# Patient Record
Sex: Female | Born: 1940
Health system: Southern US, Community
[De-identification: ages and names within clinical notes are randomized; demographics above are authoritative.]

## PROBLEM LIST (undated history)

## (undated) DIAGNOSIS — Z8619 Personal history of other infectious and parasitic diseases: Secondary | ICD-10-CM

## (undated) DIAGNOSIS — R030 Elevated blood-pressure reading, without diagnosis of hypertension: Secondary | ICD-10-CM

## (undated) DIAGNOSIS — I10 Essential (primary) hypertension: Secondary | ICD-10-CM

## (undated) DIAGNOSIS — E876 Hypokalemia: Secondary | ICD-10-CM

## (undated) DIAGNOSIS — Z124 Encounter for screening for malignant neoplasm of cervix: Secondary | ICD-10-CM

## (undated) DIAGNOSIS — Z0000 Encounter for general adult medical examination without abnormal findings: Secondary | ICD-10-CM

## (undated) DIAGNOSIS — T7840XA Allergy, unspecified, initial encounter: Secondary | ICD-10-CM

## (undated) DIAGNOSIS — R945 Abnormal results of liver function studies: Secondary | ICD-10-CM

## (undated) DIAGNOSIS — E663 Overweight: Secondary | ICD-10-CM

## (undated) DIAGNOSIS — H269 Unspecified cataract: Secondary | ICD-10-CM

## (undated) DIAGNOSIS — E785 Hyperlipidemia, unspecified: Secondary | ICD-10-CM

## (undated) DIAGNOSIS — E079 Disorder of thyroid, unspecified: Secondary | ICD-10-CM

## (undated) DIAGNOSIS — J069 Acute upper respiratory infection, unspecified: Secondary | ICD-10-CM

## (undated) DIAGNOSIS — IMO0001 Reserved for inherently not codable concepts without codable children: Secondary | ICD-10-CM

## (undated) DIAGNOSIS — E559 Vitamin D deficiency, unspecified: Secondary | ICD-10-CM

## (undated) DIAGNOSIS — D045 Carcinoma in situ of skin of trunk: Secondary | ICD-10-CM

## (undated) DIAGNOSIS — C6990 Malignant neoplasm of unspecified site of unspecified eye: Secondary | ICD-10-CM

## (undated) HISTORY — DX: Reserved for inherently not codable concepts without codable children: IMO0001

## (undated) HISTORY — PX: TONSILLECTOMY: SUR1361

## (undated) HISTORY — DX: Overweight: E66.3

## (undated) HISTORY — PX: TRUNK SKIN LESION EXCISIONAL BIOPSY: SUR474

## (undated) HISTORY — DX: Hyperlipidemia, unspecified: E78.5

## (undated) HISTORY — PX: OTHER SURGICAL HISTORY: SHX169

## (undated) HISTORY — DX: Hypokalemia: E87.6

## (undated) HISTORY — DX: Acute upper respiratory infection, unspecified: J06.9

## (undated) HISTORY — DX: Vitamin D deficiency, unspecified: E55.9

## (undated) HISTORY — DX: Encounter for screening for malignant neoplasm of cervix: Z12.4

## (undated) HISTORY — DX: Abnormal results of liver function studies: R94.5

## (undated) HISTORY — DX: Elevated blood-pressure reading, without diagnosis of hypertension: R03.0

## (undated) HISTORY — DX: Malignant neoplasm of unspecified site of unspecified eye: C69.90

## (undated) HISTORY — DX: Encounter for general adult medical examination without abnormal findings: Z00.00

## (undated) HISTORY — DX: Disorder of thyroid, unspecified: E07.9

## (undated) HISTORY — DX: Essential (primary) hypertension: I10

## (undated) HISTORY — DX: Personal history of other infectious and parasitic diseases: Z86.19

## (undated) HISTORY — DX: Unspecified cataract: H26.9

## (undated) HISTORY — DX: Allergy, unspecified, initial encounter: T78.40XA

## (undated) HISTORY — DX: Carcinoma in situ of skin of trunk: D04.5

---

## 1995-04-07 HISTORY — PX: OTHER SURGICAL HISTORY: SHX169

## 2010-06-23 DIAGNOSIS — C6930 Malignant neoplasm of unspecified choroid: Secondary | ICD-10-CM | POA: Insufficient documentation

## 2010-06-23 DIAGNOSIS — Z85840 Personal history of malignant neoplasm of eye: Secondary | ICD-10-CM | POA: Insufficient documentation

## 2010-06-23 DIAGNOSIS — H04129 Dry eye syndrome of unspecified lacrimal gland: Secondary | ICD-10-CM | POA: Insufficient documentation

## 2010-07-02 ENCOUNTER — Ambulatory Visit (INDEPENDENT_AMBULATORY_CARE_PROVIDER_SITE_OTHER): Payer: Medicare Other | Admitting: Family Medicine

## 2010-07-02 ENCOUNTER — Encounter: Payer: Self-pay | Admitting: Family Medicine

## 2010-07-02 DIAGNOSIS — R7989 Other specified abnormal findings of blood chemistry: Secondary | ICD-10-CM | POA: Insufficient documentation

## 2010-07-02 DIAGNOSIS — D045 Carcinoma in situ of skin of trunk: Secondary | ICD-10-CM | POA: Insufficient documentation

## 2010-07-02 DIAGNOSIS — R030 Elevated blood-pressure reading, without diagnosis of hypertension: Secondary | ICD-10-CM

## 2010-07-02 DIAGNOSIS — Z8619 Personal history of other infectious and parasitic diseases: Secondary | ICD-10-CM | POA: Insufficient documentation

## 2010-07-02 DIAGNOSIS — H409 Unspecified glaucoma: Secondary | ICD-10-CM | POA: Insufficient documentation

## 2010-07-02 DIAGNOSIS — M810 Age-related osteoporosis without current pathological fracture: Secondary | ICD-10-CM | POA: Insufficient documentation

## 2010-07-02 DIAGNOSIS — E079 Disorder of thyroid, unspecified: Secondary | ICD-10-CM | POA: Insufficient documentation

## 2010-07-02 DIAGNOSIS — T7840XA Allergy, unspecified, initial encounter: Secondary | ICD-10-CM | POA: Insufficient documentation

## 2010-07-02 DIAGNOSIS — Z85840 Personal history of malignant neoplasm of eye: Secondary | ICD-10-CM

## 2010-07-02 DIAGNOSIS — C6990 Malignant neoplasm of unspecified site of unspecified eye: Secondary | ICD-10-CM | POA: Insufficient documentation

## 2010-07-02 DIAGNOSIS — Z85820 Personal history of malignant melanoma of skin: Secondary | ICD-10-CM | POA: Insufficient documentation

## 2010-07-02 DIAGNOSIS — J45909 Unspecified asthma, uncomplicated: Secondary | ICD-10-CM | POA: Insufficient documentation

## 2010-07-02 DIAGNOSIS — H269 Unspecified cataract: Secondary | ICD-10-CM | POA: Insufficient documentation

## 2010-07-02 DIAGNOSIS — E663 Overweight: Secondary | ICD-10-CM

## 2010-07-02 HISTORY — DX: Other specified abnormal findings of blood chemistry: R79.89

## 2010-07-02 HISTORY — DX: Disorder of thyroid, unspecified: E07.9

## 2010-07-02 HISTORY — DX: Overweight: E66.3

## 2010-07-02 HISTORY — DX: Personal history of other infectious and parasitic diseases: Z86.19

## 2010-07-02 NOTE — Assessment & Plan Note (Signed)
Patient encouraged to maintain her good exercise regimen and avoid fatty foods and simpel carbs, Check a TSH to further evaluate.

## 2010-07-02 NOTE — Assessment & Plan Note (Signed)
Sees multiple doctors for this. Has undergone surgery and Proton radiation at [facility] with a very good response she has lost some central vision and has trouble driving during the night hours but overall is happy with her course of treatment and recovery. She sees Dr Bryon Lions locally in [place] for Opthamology and he follows her Glaucoma and Cataracts and she also sees opthamology at [facility], Dr Loyal Buba and they ultimately referred her on to Dr Driscilla Moats, at [facility], Dept of Opthamology where her procedures were done. She underwent her proton beam irradiation there and had a good response. After 6 months of treatments her visual acuity was 20/30 OD and 20/50 OS.

## 2010-07-02 NOTE — Progress Notes (Signed)
Subjective:    Patient ID: Karen Hoffman, female    DOB: [birth year], 70 y.o.   MRN: [id number]  HPI patient is in today for new patient appointment. If she has a complicated past medical history which is most significant for melanoma in her left eye. The diagnosis was made in 2010 which he noted visual changes. She saw a local ophthalmologist to quickly refer her to [facility] with [place] or for her to [facility]. She underwent uncomplicated surgery with removal of her left eye plate implanted and then underwent proton beam treatments and has responded well. She has lost some central vision and has difficulty with night vision but still maintains her peripheral vision in that eye. She otherwise feels well. She denies any recent illness, fevers, chills, chest pain, palpitations, shortness of breath, GI or GU complaints. Her blood pressure is elevated when she presents to the office and she reports it has been elevated in the past in the past she has taken hydrochlorothiazide for some results. She also does back in the 1970s she was treated for some time with Synthroid but then her numbers improved and she has not taken this medication since then. She reports using Fosamax years ago for osteoporosis but stopping the medication for unclear reasons. She does take Caltrate daily.     Review of Systems  Constitutional: Negative for fever, chills, diaphoresis, activity change, appetite change, fatigue and unexpected weight change.  HENT: Negative for hearing loss, ear pain, nosebleeds, congestion, facial swelling, rhinorrhea, sneezing, neck pain, neck stiffness, postnasal drip, sinus pressure, tinnitus and ear discharge.   Eyes: Positive for visual disturbance. Negative for photophobia, pain, discharge and redness.  Respiratory: Negative.   Cardiovascular: Negative.   Gastrointestinal: Negative.   Genitourinary: Negative.   Musculoskeletal: Negative.   Skin: Negative.   Neurological: Negative.   Hematological:  Negative.   Psychiatric/Behavioral: Negative.        Objective:   Physical Exam  Constitutional: She is oriented to person, place, and time. She appears well-developed and well-nourished. No distress.  HENT:  Head: Normocephalic and atraumatic.  Right Ear: External ear normal.  Left Ear: External ear normal.  Nose: Nose normal.  Mouth/Throat: Oropharynx is clear and moist. No oropharyngeal exudate.  Eyes: Conjunctivae and EOM are normal. Pupils are equal, round, and reactive to light. Right eye exhibits no discharge. Left eye exhibits no discharge. No scleral icterus.  Neck: Normal range of motion. Neck supple. No thyromegaly present.  Cardiovascular: Normal rate, regular rhythm, normal heart sounds and intact distal pulses.  Exam reveals no gallop and no friction rub.   No murmur heard. Pulmonary/Chest: Effort normal and breath sounds normal. No respiratory distress. She has no wheezes. She has no rales. She exhibits no tenderness.  Abdominal: Bowel sounds are normal. She exhibits no distension and no mass. There is no tenderness. There is no rebound.  Musculoskeletal: She exhibits no edema and no tenderness.  Lymphadenopathy:    She has no cervical adenopathy.  Neurological: She is alert and oriented to person, place, and time. She has normal reflexes. She displays normal reflexes. No cranial nerve deficit. Coordination normal.  Skin: Skin is warm and dry. No rash noted. She is not diaphoretic.  Psychiatric: She has a normal mood and affect. Her behavior is normal. Judgment and thought content normal.          Assessment & Plan:  Osteoporosis Patient reports she has not had a bone densitometry done in a number of years, agrees to let  us order one at her next visit. She also agrees to have labs, including a Vit D level at next visit. Continue Caltrate daily for now  Overweight Patient encouraged to maintain her good exercise regimen and avoid fatty foods and simpel carbs, Check  a TSH to further evaluate.  Thyroid disease Patient reports many years ago she was treated with Synhtroid for awhile but labs more recently have not suggested any need for this, we will recheck TSH before making any changes.  Malignant melanoma of eye Sees multiple doctors for this. Has undergone surgery and Proton radiation at [facility] with a very good response she has lost some central vision and has trouble driving during the night hours but overall is happy with her course of treatment and recovery. She sees Dr Bryon Lions locally in [place] for Opthamology and he follows her Glaucoma and Cataracts and she also sees opthamology at [facility], Dr Loyal Buba and they ultimately referred her on to Dr Driscilla Moats, at [facility], Dept of Opthamology where her procedures were done. She underwent her proton beam irradiation there and had a good response. After 6 months of treatments her visual acuity was 20/30 OD and 20/50 OS.  Allergic state No new difficulties may use Claritin OTC as needed. She did undergo allergy shots years ago but stopped them when she moved to [facility] and has had to further difficulty.  Elevated LFTs Patient reports that [facility]' office noted her LFTs were up slightly recently and asked that she have them monitored. We will check her LFTs with her return visit next month.  Elevated BP Patient hesitant to start meds, she is encouraged to avoid sodium and we will recheck next month, she is encouraged to consider Chlorthalidone at next months visit if BP remains elevated.    Past Medical History  Diagnosis Date  . Malignant melanoma of eye     left   . Cataracts, bilateral   . Glaucoma   . Elevated BP   . Osteoporosis   . Carcinoma in situ of skin of back   . Allergy     improved since move to [place]  . Asthma     mild, intermittent, improved since moving to [place]  . Overweight 07/02/2010  . Thyroid disease 07/02/2010  . History of chicken pox 07/02/2010  .  History of measles 07/02/2010    Past Surgical History  Procedure Date  . Broken ankle 1997    right, screws and plates in place  . Tonsillectomy   . Left eye surgery   . Trunk skin lesion excisional biopsy     x twice    Family History  Problem Relation Age of Onset  . Cancer Mother     lung/ smoker  . Heart attack Paternal Grandmother   . Heart disease Father   . Hypertension Father   . Kidney disease Father 42    dialysis  . Obesity Father   . Diabetes type II Father   . Graves' disease Daughter   . Cholecystitis Son 75    cholecystectomy    History   Social History  . Marital Status: Widowed    Spouse Name: N/A    Number of Children: N/A  . Years of Education: N/A   Occupational History  . Not on file.   Social History Main Topics  . Smoking status: Former Smoker -- 0.2 packs/day for 10 years    Types: Cigarettes    Quit date: 04/07/1975  . Smokeless  tobacco: Never Used  . Alcohol Use: Yes     1 glass of wine or coctail with dinner  . Drug Use: No  . Sexually Active: No   Other Topics Concern  . Not on file   Social History Narrative  . No narrative on file    No current outpatient prescriptions on file prior to visit.    No Known Allergies

## 2010-07-02 NOTE — Patient Instructions (Signed)
(High Blood Pressure) As your heart beats, it forces blood through your arteries. This force is your blood pressure. If the pressure is too high, it is called hypertension (HTN) or high blood pressure. HTN is dangerous because you may have it and not know it. High blood pressure may mean that your heart has to work harder to pump blood. Your arteries may be narrow or stiff. The extra work puts you at risk for heart disease, stroke, and other problems.  Blood pressure consists of two numbers, a higher number over a lower, 110/72, for example. It is stated as "110 over 72." The ideal is below 120 for the top number (systolic) and under 80 for the bottom (diastolic). Write down your blood pressure today. You should pay close attention to your blood pressure if you have certain conditions such as:  Heart failure.  Prior heart attack.   Diabetes   Chronic kidney disease.   Prior stroke.   Multiple risk factors for heart disease.   To see if you have HTN, your blood pressure should be measured while you are seated with your arm held at the level of the heart. It should be measured at least twice. A one-time elevated blood pressure reading (especially in the Emergency Department) does not mean that you need treatment. There may be conditions in which the blood pressure is different between your right and left arms. It is important to see your caregiver soon for a recheck. Most people have essential hypertension which means that there is not a specific cause. This type of high blood pressure may be lowered by changing lifestyle factors such as:  Stress.  Smoking.   Lack of exercise.   Excessive weight.  Drug/tobacco/alcohol use.   Eating less salt.   Most people do not have symptoms from high blood pressure until it has caused damage to the body. Effective treatment can often prevent, delay or reduce that damage. TREATMENT Treatment for high blood pressure, when a cause has been identified, is  directed at the cause. There are a large number of medications to treat HTN. These fall into several categories, and your caregiver will help you select the medicines that are best for you. Medications may have side effects. You should review side effects with your caregiver. If your blood pressure stays high after you have made lifestyle changes or started on medicines,   Your medication(s) may need to be changed.   Other problems may need to be addressed.   Be certain you understand your prescriptions, and know how and when to take your medicine.   Be sure to follow up with your caregiver within the time frame advised (usually within two weeks) to have your blood pressure rechecked and to review your medications.   If you are taking more than one medicine to lower your blood pressure, make sure you know how and at what times they should be taken. Taking two medicines at the same time can result in blood pressure that is too low.  SEEK IMMEDIATE MEDICAL CARE IF YOU DEVELOP:  A severe headache, blurred or changing vision, or confusion.   Unusual weakness or numbness, or a faint feeling.   Severe chest or abdominal pain, vomiting, or breathing problems.  MAKE SURE YOU:   Understand these instructions.   Will watch your condition.   Will get help right away if you are not doing well or get worse.  Document Released: 03/23/2005 Document Re-Released: 09/10/2009 [employer] Patient Information 2011 [employer]. 

## 2010-07-02 NOTE — Assessment & Plan Note (Signed)
Patient reports she has not had a bone densitometry done in a number of years, agrees to let us order one at her next visit. She also agrees to have labs, including a Vit D level at next visit. Continue Caltrate daily for now

## 2010-07-02 NOTE — Assessment & Plan Note (Signed)
Patient reports many years ago she was treated with Synhtroid for awhile but labs more recently have not suggested any need for this, we will recheck TSH before making any changes.

## 2010-07-02 NOTE — Assessment & Plan Note (Signed)
No new difficulties may use Claritin OTC as needed. She did undergo allergy shots years ago but stopped them when she moved to [facility] and has had to further difficulty.

## 2010-07-02 NOTE — Assessment & Plan Note (Signed)
Patient hesitant to start meds, she is encouraged to avoid sodium and we will recheck next month, she is encouraged to consider Chlorthalidone at next months visit if BP remains elevated.

## 2010-07-02 NOTE — Assessment & Plan Note (Signed)
Patient reports that Karen Hoffman' office noted her LFTs were up slightly recently and asked that she have them monitored. We will check her LFTs with her return visit next month.

## 2010-08-04 ENCOUNTER — Ambulatory Visit (INDEPENDENT_AMBULATORY_CARE_PROVIDER_SITE_OTHER): Payer: Medicare Other | Admitting: Family Medicine

## 2010-08-04 ENCOUNTER — Encounter: Payer: Self-pay | Admitting: Family Medicine

## 2010-08-04 DIAGNOSIS — R7989 Other specified abnormal findings of blood chemistry: Secondary | ICD-10-CM

## 2010-08-04 DIAGNOSIS — C6990 Malignant neoplasm of unspecified site of unspecified eye: Secondary | ICD-10-CM

## 2010-08-04 DIAGNOSIS — Z13220 Encounter for screening for lipoid disorders: Secondary | ICD-10-CM

## 2010-08-04 DIAGNOSIS — E663 Overweight: Secondary | ICD-10-CM

## 2010-08-04 DIAGNOSIS — E039 Hypothyroidism, unspecified: Secondary | ICD-10-CM

## 2010-08-04 DIAGNOSIS — M810 Age-related osteoporosis without current pathological fracture: Secondary | ICD-10-CM

## 2010-08-04 DIAGNOSIS — E079 Disorder of thyroid, unspecified: Secondary | ICD-10-CM

## 2010-08-04 DIAGNOSIS — I10 Essential (primary) hypertension: Secondary | ICD-10-CM

## 2010-08-04 DIAGNOSIS — H409 Unspecified glaucoma: Secondary | ICD-10-CM

## 2010-08-04 LAB — RENAL FUNCTION PANEL
Albumin: 3.9 g/dL (ref 3.5–5.2)
BUN: 14 mg/dL (ref 6–23)
Calcium: 9.4 mg/dL (ref 8.4–10.5)
Creatinine, Ser: 0.7 mg/dL (ref 0.4–1.2)
Glucose, Bld: 85 mg/dL (ref 70–99)
Phosphorus: 3.7 mg/dL (ref 2.3–4.6)
Potassium: 4 mEq/L (ref 3.5–5.1)

## 2010-08-04 LAB — HEPATIC FUNCTION PANEL
ALT: 32 U/L (ref 0–35)
Albumin: 3.9 g/dL (ref 3.5–5.2)
Bilirubin, Direct: 0.1 mg/dL (ref 0.0–0.3)
Total Protein: 6.4 g/dL (ref 6.0–8.3)

## 2010-08-04 LAB — LIPID PANEL
Cholesterol: 211 mg/dL — ABNORMAL HIGH (ref 0–200)
HDL: 47.5 mg/dL (ref >39.00)
Triglycerides: 157 mg/dL — ABNORMAL HIGH (ref 0.0–149.0)
VLDL: 31.4 mg/dL (ref 0.0–40.0)

## 2010-08-04 LAB — T4, FREE: Free T4: 1 ng/dL (ref 0.60–1.60)

## 2010-08-04 LAB — CBC WITH DIFFERENTIAL/PLATELET
Basophils Absolute: 0 10*3/uL (ref 0.0–0.1)
Eosinophils Absolute: 0.1 10*3/uL (ref 0.0–0.7)
HCT: 43.3 % (ref 36.0–46.0)
Hemoglobin: 14.9 g/dL (ref 12.0–15.0)
Lymphocytes Relative: 23.3 % (ref 12.0–46.0)
Lymphs Abs: 1.7 10*3/uL (ref 0.7–4.0)
MCHC: 34.5 g/dL (ref 30.0–36.0)
Neutro Abs: 4.8 10*3/uL (ref 1.4–7.7)
Platelets: 199 10*3/uL (ref 150.0–400.0)
RDW: 13.8 % (ref 11.5–14.6)

## 2010-08-04 LAB — LDL CHOLESTEROL, DIRECT: Direct LDL: 138.7 mg/dL

## 2010-08-04 LAB — T3, FREE: T3, Free: 2.3 pg/mL (ref 2.3–4.2)

## 2010-08-04 MED ORDER — CHLORTHALIDONE 25 MG PO TABS: 25.0000 mg | ORAL_TABLET | Freq: Every day | ORAL | Status: DC

## 2010-08-04 MED ORDER — DORZOLAMIDE HCL 2 % OP SOLN: 1.0000 [drp] | Freq: Two times a day (BID) | OPHTHALMIC | Status: DC

## 2010-08-04 NOTE — Patient Instructions (Signed)
Osteoporosis Osteoporosis is a disease of the bones that makes them weaker and prone to break (fracture). By their mid-30s, most people begin to gradually lose bone strength. If this is severe enough, osteoporosis may occur. Osteopenia is a less severe weakness of the bones, which places you at risk for osteoporosis. It is important to identify if you have osteoporosis or osteopenia. Bone fractures from osteoporosis (especially hip and spine fractures) are a major cause of hospitalization, loss of independence, and can lead to life-threatening complications. CAUSES There are a number of causes and risk factors:  Gender. Women are at a higher risk for osteoporosis than men.   Age. Bone formation slows down with age.   Ethnicity. For unclear reasons, white and Asian women are at higher risk for osteoporosis. Hispanic and African American women are at increased, but lesser, risk.   Family history of osteoporosis can mean that you are at a higher risk for getting it.   History of bone fractures indicates you may be at higher risk of another.   Calcium is very important for bone health and strength. Not enough calcium in your diet increases your risk for osteoporosis. Vitamin D is important for calcium metabolism. You get vitamin D from sunlight, foods, or supplements.   Physical activity. Bones get stronger with weight-bearing exercise and weaker without use.   Smoking is associated with decreased bone strength.   Medicines. Cortisone medicines, too much thyroid medicine, some cancer and seizure medicines, and others can weaken bones and cause osteoporosis.   Decreased body weight is associated with osteoporosis. The small amount of estrogen-type molecules produced in fat cells seems to protect the bones.   Menopausal decrease in the hormone estrogen can cause osteoporosis.   Low levels of the hormone testosterone can cause osteoporosis.   Some medical conditions can lead to osteoporosis  (hyperthyroidism, hyperparathyroidism, B12 deficiency).  SYMPTOMS Usually, no symptoms are felt as the bones weaken. The first symptoms are generally related to bone fractures. You may have silent, tiny bone fractures, especially in your spine. This can cause height loss and forward bending of the spine (kyphosis). DIAGNOSIS You or your caregiver may suspect osteoporosis based on height loss and kyphosis. Osteoporosis or osteopenia may be identified on an X-ray done for other reasons. A bone density measurement will likely be taken. Your bones are often measured at your lower spine or your hips. Measurement is done by an X-ray called a DEXA scan, or sometimes by a computerized X-ray scan (CT or CAT scan). Other tests may be done to find the cause of osteoporosis, such as blood tests to measure calcium and vitamin D, or to monitor treatment. TREATMENT The goal of osteoporosis treatment is to prevent fractures. This is done through medicine and home care treatments. Treatment will slow the weakening of your bones and strengthen them where possible. Measures to decrease the likelihood of falling and fracturing a bone are also important. Medicine  You may need supplements if you are not getting enough calcium, vitamin D, and vitamin B12.   If you are female and menopausal, you should discuss the option of estrogen replacement or estrogen-like medicine with your caregiver.   Medicines can be taken by mouth or injection to help build bone strength. When taken by mouth, there are important directions that you need to follow.   Calcitonin is a hormone made by the thyroid gland that can help build bone strength and decrease fracture risk in the spine. It can be taken by  nasal spray or injection.   Parathyroid hormone can be injected to help build bone strength.   You will need to continue to get enough calcium intake with any of these medicines.  FALL PREVENTION  If you are unsteady on your feet, use a  cane, walker, or walk with someone's help.   Remove loose rugs or electrical cords from your home.   Keep your home well lit at night. Use glasses if you need them.   Avoid icy streets and wet or waxed floors.   Hold the railing when using stairs.   Watch out for your pets.   Install grab bars in your bathroom.   Exercise. Physical activity, especially weight-bearing exercise, helps strengthen bones. Strength and balance exercise, such as tai chi, helps prevent falls.   Alcohol and some medicines can make you more likely to fall. Discuss alcohol use with your caregiver. Ask your caregiver if any of your medicines might increase your risk for falling. Ask if safer alternatives are available.  HOME CARE INSTRUCTIONS  Try to prevent and avoid falls.   To pick up objects, bend at the knees. Do not bend with your back.   Do not smoke. If you smoke, ask for help to stop.   Have adequate calcium and vitamin D in your diet. Talk with your caregiver about amounts.   Before exercising, ask your caregiver what exercises will be good for you.   Only take over-the-counter or prescription medicines for pain, discomfort, or fever as directed by your caregiver.  SEEK MEDICAL CARE IF:  You have had a fracture and your pain is not controlled.   You have had a fracture and you are not able to return to activities as expected.   You are reinjured.   You develop side effects from medicines, especially stomach pain or trouble swallowing.   You develop new, unexplained problems.  SEEK IMMEDIATE MEDICAL CARE IF:  You develop sudden, severe pain in your back.   You develop pain after an injury or fall.  Document Released: 12/31/2004 Document Re-Released: 09/10/2009 [facility] Patient Information 2011 [place], [place].  Switch to Citracal with D twice a day (want Calcium Citrate no Carbonate)

## 2010-08-05 ENCOUNTER — Encounter: Payer: Self-pay | Admitting: Family Medicine

## 2010-08-06 ENCOUNTER — Encounter: Payer: Self-pay | Admitting: Family Medicine

## 2010-08-06 DIAGNOSIS — I10 Essential (primary) hypertension: Secondary | ICD-10-CM

## 2010-08-06 HISTORY — DX: Essential (primary) hypertension: I10

## 2010-08-06 NOTE — Assessment & Plan Note (Signed)
Have reviewed records from [facility] and patient will continue to follow with Opthalmology

## 2010-08-06 NOTE — Assessment & Plan Note (Signed)
Thyroid studies wnl, no changes today

## 2010-08-06 NOTE — Assessment & Plan Note (Signed)
Numbers continue to be elevated will add Chlorthalidone and recheck bp and renal panel in at next visit

## 2010-08-06 NOTE — Assessment & Plan Note (Signed)
Patient is frustrated regarding weight gain, avoid simple carbs increase exercise, use small, frequent meals with complex carbs and lean proteins. Consider the DASH diet

## 2010-08-06 NOTE — Assessment & Plan Note (Addendum)
Consider switching to Citracal bid and increase exercise. Will follow bone densitometries and check Vitamin D levels

## 2010-08-06 NOTE — Progress Notes (Signed)
Karen Hoffman [id number] 28-Dec-1940 08/06/2010      Progress Note-Follow Up  Subjective  Chief Complaint  Chief Complaint  Patient presents with  . Follow-up    possible labs today?    HPI  A shunt is a 70 year old Caucasian female in today for followup on her new patient appointment. Overall she is doing relatively well. She does report she started milk thistle capsules hoping to help her elevated LFTs. She's had no difficulty or upset stomach from her operation. She is also reporting her daughter's thyroid disease was Hashimoto's disease and she is now treated for hypoactive thyroid. The patient herself denies any recent illness, fevers, chills, chest pain, palpitations, shortness of breath, GI or GU complaints at this time. She is frustrated about her ongoing weight but has not been exercising regularly she is aware that she is in need of a bone densitometry at this time she reports they've Hemoccults in 2010 but declines colonoscopy. Denies bloody or tarry stool, constipation, change in bowel habits. She has not checked her blood pressure since being hospitalized headaches, fatigue, myalgias or concerns. She is taking Caltrate twice a day for osteoporosis.  Past Medical History  Diagnosis Date  . Malignant melanoma of eye     left   . Cataracts, bilateral   . Glaucoma   . Elevated BP   . Osteoporosis   . Carcinoma in situ of skin of back   . Allergy     improved since move to [place]  . Asthma     mild, intermittent, improved since moving to [place]  . Overweight 07/02/2010  . Thyroid disease 07/02/2010  . History of chicken pox 07/02/2010  . History of measles 07/02/2010  . Elevated LFTs 07/02/2010  . HTN (hypertension) 08/06/2010    Past Surgical History  Procedure Date  . Broken ankle 1997    right, screws and plates in place  . Tonsillectomy   . Left eye surgery   . Trunk skin lesion excisional biopsy     x twice    Family History  Problem Relation Age of Onset  . Cancer  Mother     lung/ smoker  . Heart attack Paternal Grandmother   . Heart disease Father   . Hypertension Father   . Kidney disease Father 69    dialysis  . Obesity Father   . Diabetes type II Father   . Graves' disease Daughter   . Hypothyroidism Daughter     h/o Hashimoto's now hypothyroid  . Cholecystitis Son 63    cholecystectomy    History   Social History  . Marital Status: Widowed    Spouse Name: N/A    Number of Children: N/A  . Years of Education: N/A   Occupational History  . Not on file.   Social History Main Topics  . Smoking status: Former Smoker -- 0.2 packs/day for 10 years    Types: Cigarettes    Quit date: 04/07/1975  . Smokeless tobacco: Never Used  . Alcohol Use: Yes     1 glass of wine or coctail with dinner  . Drug Use: No  . Sexually Active: No   Other Topics Concern  . Not on file   Social History Narrative  . No narrative on file    Current Outpatient Prescriptions on File Prior to Visit  Medication Sig Dispense Refill  . Ascorbic Acid (VITAMIN C) 500 MG tablet Take 500 mg by mouth daily.        Marland Kitchen  Calcium Carbonate-Vitamin D (CALTRATE 600+D PO) Take 1 tablet by mouth daily.        . Flaxseed, Linseed, (FLAXSEED OIL) 1000 MG CAPS Take 1 capsule by mouth daily.        Marland Kitchen latanoprost (XALATAN) 0.005 % ophthalmic solution Place 1 drop into both eyes at bedtime.        . Multiple Vitamins-Minerals (CENTRUM PO) Take 1 tablet by mouth daily.        . Probiotic Product (ALIGN PO) Take by mouth daily.        . timolol (TIMOPTIC) 0.5 % ophthalmic solution Place 1 drop into the right eye 2 (two) times daily.          No Known Allergies  Review of Systems  Review of Systems  Constitutional: Negative for fever and malaise/fatigue.  HENT: Negative for congestion.   Eyes: Negative for discharge.  Respiratory: Negative for shortness of breath.   Cardiovascular: Negative for chest pain, palpitations and leg swelling.  Gastrointestinal: Negative for  nausea, abdominal pain and diarrhea.  Genitourinary: Negative for dysuria.  Musculoskeletal: Negative for falls.  Skin: Negative for rash.  Neurological: Negative for loss of consciousness and headaches.  Endo/Heme/Allergies: Negative for polydipsia.  Psychiatric/Behavioral: Negative for depression and suicidal ideas. The patient is not nervous/anxious and does not have insomnia.     Objective  BP 168/78  Pulse 58  Temp(Src) 97.9 F (36.6 C) (Oral)  Ht 5' 4.75" (1.645 m)  Wt 186 lb 1.9 oz (84.423 kg)  BMI 31.21 kg/m2  SpO2 97%  Physical Exam  Physical Exam  Constitutional: She is oriented to person, place, and time and well-developed, well-nourished, and in no distress. No distress.  HENT:  Head: Normocephalic and atraumatic.  Eyes: Conjunctivae are normal.  Neck: Neck supple. No thyromegaly present.  Cardiovascular: Normal rate, regular rhythm and normal heart sounds.   No murmur heard. Pulmonary/Chest: Effort normal and breath sounds normal. She has no wheezes.  Abdominal: She exhibits no distension and no mass.  Musculoskeletal: She exhibits no edema.  Lymphadenopathy:    She has no cervical adenopathy.  Neurological: She is alert and oriented to person, place, and time.  Skin: Skin is warm and dry. No rash noted. She is not diaphoretic.  Psychiatric: Memory, affect and judgment normal.    Lab Results  Component Value Date   TSH 1.42 08/04/2010   Lab Results  Component Value Date   WBC 7.1 08/04/2010   HGB 14.9 08/04/2010   HCT 43.3 08/04/2010   MCV 90.1 08/04/2010   PLT 199.0 08/04/2010   Lab Results  Component Value Date   CREATININE 0.7 08/04/2010   BUN 14 08/04/2010   NA 137 08/04/2010   K 4.0 08/04/2010   CL 101 08/04/2010   CO2 29 08/04/2010   Lab Results  Component Value Date   ALT 32 08/04/2010   AST 23 08/04/2010   ALKPHOS 86 08/04/2010   BILITOT 0.7 08/04/2010   Lab Results  Component Value Date   CHOL 211* 08/04/2010   Lab Results  Component Value  Date   HDL 47.50 08/04/2010   No results found for this basename: LDLCALC   Lab Results  Component Value Date   TRIG 157.0* 08/04/2010   Lab Results  Component Value Date   CHOLHDL 4 08/04/2010     Assessment & Plan  Thyroid disease Thyroid studies wnl, no changes today  Overweight Patient is frustrated regarding weight gain, avoid simple carbs increase exercise, use small,  frequent meals with complex carbs and lean proteins. Consider the DASH diet  Osteoporosis Consider switching to Citracal bid and increase exercise. Will follow bone densitometries and check Vitamin D levels  Malignant melanoma of eye Have reviewed records from [facility] and patient will continue to follow with Opthalmology  Elevated LFTs Will repeat liver function studies and compare with [facility] results. Avoid simple carbs  HTN (hypertension) Numbers continue to be elevated will add Chlorthalidone and recheck bp and renal panel in at next visit

## 2010-08-06 NOTE — Assessment & Plan Note (Signed)
Will repeat liver function studies and compare with [facility] results. Avoid simple carbs

## 2010-08-13 ENCOUNTER — Encounter: Payer: Self-pay | Admitting: Family Medicine

## 2010-09-04 ENCOUNTER — Encounter: Payer: Self-pay | Admitting: Family Medicine

## 2010-09-04 ENCOUNTER — Ambulatory Visit (INDEPENDENT_AMBULATORY_CARE_PROVIDER_SITE_OTHER): Payer: Medicare Other | Admitting: Family Medicine

## 2010-09-04 VITALS — BP 128/82 | HR 58 | Temp 98.0°F | Ht 64.75 in | Wt 183.4 lb

## 2010-09-04 DIAGNOSIS — E663 Overweight: Secondary | ICD-10-CM

## 2010-09-04 DIAGNOSIS — H409 Unspecified glaucoma: Secondary | ICD-10-CM

## 2010-09-04 DIAGNOSIS — E785 Hyperlipidemia, unspecified: Secondary | ICD-10-CM | POA: Insufficient documentation

## 2010-09-04 DIAGNOSIS — I10 Essential (primary) hypertension: Secondary | ICD-10-CM

## 2010-09-04 DIAGNOSIS — E079 Disorder of thyroid, unspecified: Secondary | ICD-10-CM

## 2010-09-04 HISTORY — DX: Hyperlipidemia, unspecified: E78.5

## 2010-09-04 LAB — RENAL FUNCTION PANEL
Albumin: 4.3 g/dL (ref 3.5–5.2)
BUN: 15 mg/dL (ref 6–23)
Chloride: 98 mEq/L (ref 96–112)
Creatinine, Ser: 0.7 mg/dL (ref 0.4–1.2)
GFR: 82.49 mL/min (ref >60.00)
Glucose, Bld: 102 mg/dL — ABNORMAL HIGH (ref 70–99)
Phosphorus: 3.5 mg/dL (ref 2.3–4.6)

## 2010-09-04 NOTE — Assessment & Plan Note (Signed)
Repeat a TSH at next visit.

## 2010-09-04 NOTE — Assessment & Plan Note (Signed)
Mild elevation, patient agrees to repeat Lipid panel and an NMR test in 6 months, avoid trans fats and minimize saturated fats and simple carbs

## 2010-09-04 NOTE — Assessment & Plan Note (Signed)
Patient exercising again, avoid trans fats and maintain smaller portion sizes

## 2010-09-04 NOTE — Patient Instructions (Signed)
Hypercholesterolemia High Blood Cholesterol Cholesterol is a white, waxy, fat-like protein needed by your body in small amounts. The liver makes all the cholesterol you need. It is carried from the liver by the blood through the blood vessels. Deposits (plaque) may build up on blood vessel walls. This makes the arteries narrower and stiffer. Plaque increases the risk for heart attack and stroke. You cannot feel your cholesterol level even if it is very high. The only way to know is by a blood test to check your lipid (fats) levels. Once you know your cholesterol levels, you should keep a record of the test results. Work with your caregiver to to keep your levels in the desired range. WHAT THE RESULTS MEAN:  Total cholesterol is a rough measure of all the cholesterol in your blood.   LDL is the so-called bad cholesterol. This is the type that deposits cholesterol in the walls of the arteries. You want this level to be low.   HDL is the good cholesterol because it cleans the arteries and carries the LDL away. You want this level to be high.   Triglycerides are fat that the body can either burn for energy or store. High levels are closely linked to heart disease.  DESIRED LEVELS:  Total cholesterol below 200.   LDL below 100 for people at risk, below 70 for very high risk.   HDL above 50 is good, above 60 is best.   Triglycerides below 150.  HOW TO LOWER YOUR CHOLESTEROL:  Diet.   Choose fish or white meat chicken and [place], roasted or baked. Limit fatty cuts of red meat, fried foods, and processed meats, such as sausage and lunch meat.   Eat lots of fresh fruits and vegetables. Choose whole grains, beans, pasta, potatoes and cereals.   Use only small amounts of olive, corn or canola oils. Avoid butter, mayonnaise, shortening or palm kernel oils. Avoid foods with trans-fats.   Use skim/nonfat milk and low-fat/nonfat yogurt and cheeses. Avoid whole milk, cream, ice cream, egg yolks and  cheeses. Healthy desserts include angel food cake, gingersnaps, animal crackers, hard candy, popsicles, and low-fat/nonfat frozen yogurt. Avoid pastries, cakes, pies and cookies.   Exercise.   A regular program helps decrease LDL and raises HDL.   Helps with weight control.   Do things that increase your activity level like gardening, walking, or taking the stairs.   Medication.   May be prescribed by your caregiver to help lowering cholesterol and the risk for heart disease.   You may need medicine even if your levels are normal if you have several risk factors.  HOME CARE INSTRUCTIONS  Follow your diet and exercise programs as suggested by your caregiver.   Take medications as directed.   Have blood work done when your caregiver feels it is necessary.  MAKE SURE YOU:   Understand these instructions.   Will watch your condition.   Will get help right away if you are not doing well or get worse.  Document Released: 03/23/2005 Document Re-Released: 03/05/2008 [facility] Patient Information 2011 [place], [place].Hypertension (High Blood Pressure) As your heart beats, it forces blood through your arteries. This force is your blood pressure. If the pressure is too high, it is called hypertension (HTN) or high blood pressure. HTN is dangerous because you may have it and not know it. High blood pressure may mean that your heart has to work harder to pump blood. Your arteries may be narrow or stiff. The extra work puts  you at risk for heart disease, stroke, and other problems.  Blood pressure consists of two numbers, a higher number over a lower, 110/72, for example. It is stated as "110 over 72." The ideal is below 120 for the top number (systolic) and under 80 for the bottom (diastolic). Write down your blood pressure today. You should pay close attention to your blood pressure if you have certain conditions such as:  Heart failure.  Prior heart attack.   Diabetes   Chronic kidney  disease.   Prior stroke.   Multiple risk factors for heart disease.   To see if you have HTN, your blood pressure should be measured while you are seated with your arm held at the level of the heart. It should be measured at least twice. A one-time elevated blood pressure reading (especially in the Emergency Department) does not mean that you need treatment. There may be conditions in which the blood pressure is different between your right and left arms. It is important to see your caregiver soon for a recheck. Most people have essential hypertension which means that there is not a specific cause. This type of high blood pressure may be lowered by changing lifestyle factors such as:  Stress.  Smoking.   Lack of exercise.   Excessive weight.  Drug/tobacco/alcohol use.   Eating less salt.   Most people do not have symptoms from high blood pressure until it has caused damage to the body. Effective treatment can often prevent, delay or reduce that damage. TREATMENT Treatment for high blood pressure, when a cause has been identified, is directed at the cause. There are a large number of medications to treat HTN. These fall into several categories, and your caregiver will help you select the medicines that are best for you. Medications may have side effects. You should review side effects with your caregiver. If your blood pressure stays high after you have made lifestyle changes or started on medicines,   Your medication(s) may need to be changed.   Other problems may need to be addressed.   Be certain you understand your prescriptions, and know how and when to take your medicine.   Be sure to follow up with your caregiver within the time frame advised (usually within two weeks) to have your blood pressure rechecked and to review your medications.   If you are taking more than one medicine to lower your blood pressure, make sure you know how and at what times they should be taken. Taking  two medicines at the same time can result in blood pressure that is too low.  SEEK IMMEDIATE MEDICAL CARE IF YOU DEVELOP:  A severe headache, blurred or changing vision, or confusion.   Unusual weakness or numbness, or a faint feeling.   Severe chest or abdominal pain, vomiting, or breathing problems.  MAKE SURE YOU:   Understand these instructions.   Will watch your condition.   Will get help right away if you are not doing well or get worse.  Document Released: 03/23/2005 Document Re-Released: 09/10/2009 [facility] Patient Information 2011 [place], [place].

## 2010-09-04 NOTE — Assessment & Plan Note (Signed)
Well controlled on repeat. She is encouraged to continue current meds and her increased regimen of exercise and we will check a renal panel today

## 2010-09-04 NOTE — Assessment & Plan Note (Signed)
Patient reports she recently saw her eye doctor and the pressure was down in her eyes despite no changes in eye drops the only change was adding Chlorthalidone

## 2010-09-04 NOTE — Progress Notes (Signed)
Karen Hoffman [id number] [birth year] 09/04/2010      Progress Note-Follow Up  Subjective  Chief Complaint  Chief Complaint  Patient presents with  . Follow-up    Blood Pressure-1 month follow up    HPI  Patient is a 70 yo caucasian female in today for follow up on HTN. She is feeling well and denies any recent illness, fevers, chills, chest pain, palpitations, shortness of breath, GI or GU concerns. [facility] not resting before she does so her log is reviewed and it shows systolic ranging from 118-148 and diastolic blood pressure ranging from 71-93. She knows she's recently been in by her eye doctor and the pressure from the glaucoma in her eyes has improved without any change in eye drops. So she is pleased. She has been exercising more and eating less so she says she feels better. She's had no trouble with her allergies or asthma.  Past Medical History  Diagnosis Date  . Malignant melanoma of eye     left   . Cataracts, bilateral   . Glaucoma   . Elevated BP   . Osteoporosis   . Carcinoma in situ of skin of back   . Allergy     improved since move to [place]  . Asthma     mild, intermittent, improved since moving to [place]  . Overweight 07/02/2010  . Thyroid disease 07/02/2010  . History of chicken pox 07/02/2010  . History of measles 07/02/2010  . Elevated LFTs 07/02/2010  . HTN (hypertension) 08/06/2010  . Hyperlipidemia 09/04/2010    Past Surgical History  Procedure Date  . Broken ankle 1997    right, screws and plates in place  . Tonsillectomy   . Left eye surgery   . Trunk skin lesion excisional biopsy     x twice    Family History  Problem Relation Age of Onset  . Cancer Mother     lung/ smoker  . Heart attack Paternal Grandmother   . Heart disease Father   . Hypertension Father   . Kidney disease Father 11    dialysis  . Obesity Father   . Diabetes type II Father   . Graves' disease Daughter   .  Hypothyroidism Daughter     h/o Hashimoto's now hypothyroid  . Cholecystitis Son 3    cholecystectomy    History   Social History  . Marital Status: Widowed    Spouse Name: N/A    Number of Children: N/A  . Years of Education: N/A   Occupational History  . Not on file.   Social History Main Topics  . Smoking status: Former Smoker -- 0.2 packs/day for 10 years    Types: Cigarettes    Quit date: 04/07/1975  . Smokeless tobacco: Never Used  . Alcohol Use: Yes     1 glass of wine or coctail with dinner  . Drug Use: No  . Sexually Active: No   Other Topics Concern  . Not on file   Social History Narrative  . No narrative on file    Current Outpatient Prescriptions on File Prior to Visit  Medication Sig Dispense Refill  . Ascorbic Acid (VITAMIN C) 500 MG tablet Take 500 mg by mouth daily.        . Calcium Carbonate-Vitamin D (CALTRATE 600+D PO) Take 1 tablet by mouth daily.        . chlorthalidone (HYGROTON) 25 MG tablet Take 1  tablet (25 mg total) by mouth daily.  30 tablet  11  . dorzolamide (TRUSOPT) 2 % ophthalmic solution Place 1 drop into the right eye 2 (two) times daily.  10 mL  0  . Flaxseed, Linseed, (FLAXSEED OIL) 1000 MG CAPS Take 1 capsule by mouth daily.        Marland Kitchen latanoprost (XALATAN) 0.005 % ophthalmic solution Place 1 drop into both eyes at bedtime.        . Milk Thistle 250 MG CAPS Take by mouth. 1-2 tablets daily       . Multiple Vitamins-Minerals (CENTRUM PO) Take 1 tablet by mouth daily.        . Probiotic Product (ALIGN PO) Take by mouth daily.        . timolol (TIMOPTIC) 0.5 % ophthalmic solution Place 1 drop into the right eye 2 (two) times daily.          No Known Allergies  Review of Systems  Review of Systems  Constitutional: Negative for fever and malaise/fatigue.  HENT: Negative for congestion.   Eyes: Negative for discharge.  Respiratory: Negative for shortness of breath.   Cardiovascular: Negative for chest pain, palpitations and leg  swelling.  Gastrointestinal: Negative for nausea, abdominal pain and diarrhea.  Genitourinary: Negative for dysuria.  Musculoskeletal: Negative for falls.  Skin: Negative for rash.  Neurological: Negative for loss of consciousness and headaches.  Endo/Heme/Allergies: Negative for polydipsia.  Psychiatric/Behavioral: Negative for depression and suicidal ideas. The patient is not nervous/anxious and does not have insomnia.     Objective  BP 128/82  Pulse 58  Temp(Src) 98 F (36.7 C) (Oral)  Ht 5' 4.75" (1.645 m)  Wt 183 lb 6.4 oz (83.19 kg)  BMI 30.76 kg/m2  SpO2 96%  Physical Exam  Physical Exam  Constitutional: She appears well-developed and well-nourished.  HENT:  Head: Normocephalic and atraumatic.  Nose: Nose normal.  Mouth/Throat: Oropharynx is clear and moist.  Neck: Normal range of motion. Neck supple. No thyromegaly present.  Cardiovascular: Normal rate, regular rhythm and normal heart sounds.   No murmur heard. Pulmonary/Chest: No respiratory distress. She has no wheezes. She has no rales.  Abdominal: She exhibits no distension.  Skin: Skin is dry.  Psychiatric: She has a normal mood and affect. Thought content normal.    Lab Results  Component Value Date   TSH 1.42 08/04/2010   Lab Results  Component Value Date   WBC 7.1 08/04/2010   HGB 14.9 08/04/2010   HCT 43.3 08/04/2010   MCV 90.1 08/04/2010   PLT 199.0 08/04/2010   Lab Results  Component Value Date   CREATININE 0.7 08/04/2010   BUN 14 08/04/2010   NA 137 08/04/2010   K 4.0 08/04/2010   CL 101 08/04/2010   CO2 29 08/04/2010   Lab Results  Component Value Date   ALT 32 08/04/2010   AST 23 08/04/2010   ALKPHOS 86 08/04/2010   BILITOT 0.7 08/04/2010   Lab Results  Component Value Date   CHOL 211* 08/04/2010   Lab Results  Component Value Date   HDL 47.50 08/04/2010   No results found for this basename: LDLCALC   Lab Results  Component Value Date   TRIG 157.0* 08/04/2010   Lab Results  Component  Value Date   CHOLHDL 4 08/04/2010     Assessment & Plan  Glaucoma Patient reports she recently saw her eye doctor and the pressure was down in her eyes despite no changes in eye drops  the only change was adding Chlorthalidone  HTN (hypertension) Well controlled on repeat. She is encouraged to continue current meds and her increased regimen of exercise and we will check a renal panel today  Thyroid disease Repeat a TSH at next visit.  Overweight Patient exercising again, avoid trans fats and maintain smaller portion sizes  Hyperlipidemia Mild elevation, patient agrees to repeat Lipid panel and an NMR test in 6 months, avoid trans fats and minimize saturated fats and simple carbs

## 2010-09-10 ENCOUNTER — Other Ambulatory Visit: Payer: Self-pay | Admitting: Family Medicine

## 2010-09-10 DIAGNOSIS — Z1231 Encounter for screening mammogram for malignant neoplasm of breast: Secondary | ICD-10-CM

## 2010-09-17 ENCOUNTER — Ambulatory Visit
Admission: RE | Admit: 2010-09-17 | Discharge: 2010-09-17 | Disposition: A | Discharge status: Home / Self Care | Payer: Medicare Other | Source: Ambulatory Visit | Attending: Family Medicine | Admitting: Family Medicine

## 2010-09-17 DIAGNOSIS — Z1231 Encounter for screening mammogram for malignant neoplasm of breast: Secondary | ICD-10-CM

## 2010-09-25 ENCOUNTER — Other Ambulatory Visit (INDEPENDENT_AMBULATORY_CARE_PROVIDER_SITE_OTHER): Payer: Medicare Other

## 2010-09-25 DIAGNOSIS — I10 Essential (primary) hypertension: Secondary | ICD-10-CM

## 2011-02-09 ENCOUNTER — Other Ambulatory Visit: Payer: Medicare Other

## 2011-02-16 ENCOUNTER — Ambulatory Visit: Payer: Medicare Other | Admitting: Family Medicine

## 2011-04-13 ENCOUNTER — Other Ambulatory Visit (INDEPENDENT_AMBULATORY_CARE_PROVIDER_SITE_OTHER): Payer: Medicare Other

## 2011-04-13 DIAGNOSIS — I10 Essential (primary) hypertension: Secondary | ICD-10-CM

## 2011-04-13 DIAGNOSIS — E785 Hyperlipidemia, unspecified: Secondary | ICD-10-CM | POA: Diagnosis not present

## 2011-04-13 LAB — LDL CHOLESTEROL, DIRECT: Direct LDL: 134.9 mg/dL

## 2011-04-13 LAB — RENAL FUNCTION PANEL
CO2: 31 mEq/L (ref 19–32)
Calcium: 9.2 mg/dL (ref 8.4–10.5)
Chloride: 103 mEq/L (ref 96–112)
GFR: 70.18 mL/min (ref >60.00)
Sodium: 142 mEq/L (ref 135–145)

## 2011-04-13 LAB — CBC
Platelets: 208 10*3/uL (ref 150.0–400.0)
RBC: 4.68 Mil/uL (ref 3.87–5.11)

## 2011-04-13 LAB — LIPID PANEL
Cholesterol: 217 mg/dL — ABNORMAL HIGH (ref 0–200)
Total CHOL/HDL Ratio: 4

## 2011-04-13 LAB — TSH: TSH: 2.4 u[IU]/mL (ref 0.35–5.50)

## 2011-04-14 LAB — HEPATIC FUNCTION PANEL
ALT: 58 U/L — ABNORMAL HIGH (ref 0–35)
AST: 37 U/L (ref 0–37)
Bilirubin, Direct: 0.1 mg/dL (ref 0.0–0.3)
Total Bilirubin: 0.8 mg/dL (ref 0.3–1.2)
Total Protein: 6.6 g/dL (ref 6.0–8.3)

## 2011-04-15 LAB — NMR LIPOPROFILE WITHOUT LIPIDS
HDL Particle Number: 32.2 umol/L (ref >=30.5)
LDL Size: 21.3 nm (ref >20.5)
LP-IR Score: 46 — ABNORMAL HIGH (ref <=45)
Small LDL Particle Number: 632 nmol/L — ABNORMAL HIGH (ref <=527)

## 2011-04-21 ENCOUNTER — Ambulatory Visit (INDEPENDENT_AMBULATORY_CARE_PROVIDER_SITE_OTHER): Payer: Medicare Other | Admitting: Family Medicine

## 2011-04-21 ENCOUNTER — Encounter: Payer: Self-pay | Admitting: Family Medicine

## 2011-04-21 VITALS — BP 138/78 | HR 60 | Temp 97.6°F | Ht 64.75 in | Wt 190.0 lb

## 2011-04-21 DIAGNOSIS — Z23 Encounter for immunization: Secondary | ICD-10-CM

## 2011-04-21 DIAGNOSIS — I10 Essential (primary) hypertension: Secondary | ICD-10-CM | POA: Diagnosis not present

## 2011-04-21 DIAGNOSIS — E785 Hyperlipidemia, unspecified: Secondary | ICD-10-CM

## 2011-04-21 DIAGNOSIS — R7989 Other specified abnormal findings of blood chemistry: Secondary | ICD-10-CM

## 2011-04-21 DIAGNOSIS — E079 Disorder of thyroid, unspecified: Secondary | ICD-10-CM | POA: Diagnosis not present

## 2011-04-21 DIAGNOSIS — Z0000 Encounter for general adult medical examination without abnormal findings: Secondary | ICD-10-CM | POA: Insufficient documentation

## 2011-04-21 HISTORY — DX: Encounter for general adult medical examination without abnormal findings: Z00.00

## 2011-04-21 MED ORDER — CHLORTHALIDONE 25 MG PO TABS: 25.0000 mg | ORAL_TABLET | Freq: Every day | ORAL | Status: DC

## 2011-04-21 NOTE — Assessment & Plan Note (Signed)
Labs wnl and no concerning symptoms.

## 2011-04-21 NOTE — Progress Notes (Signed)
Patient ID: Karen Hoffman, female   DOB: Jul 23, 1940, 71 y.o.   MRN: [id number] Karen Hoffman [id number] 1940/10/23 04/21/2011      Progress Note-Follow Up  Subjective  Chief Complaint  Chief Complaint  Patient presents with  . Follow-up    6 month follow up    HPI  Patient is a 71 year old Caucasian female who is in today for followup. Overall she feels well. She's been checking her blood pressure at the line systolics in the 130s and diastolics in the 70s. She reports she feels well. No recent illness, fevers, chills, chest pain, palpitations, shortness of breath, GI or GU complaints. She notes her biggest complaint is her thinning hair. It is not striking but it is persistent. She is not exercising but she is trying to avoid trans fats  Past Medical History  Diagnosis Date  . Malignant melanoma of eye     left   . Cataracts, bilateral   . Glaucoma   . Elevated BP   . Osteoporosis   . Carcinoma in situ of skin of back   . Allergy     improved since move to [place]  . Asthma     mild, intermittent, improved since moving to [place]  . Overweight 07/02/2010  . Thyroid disease 07/02/2010  . History of chicken pox 07/02/2010  . History of measles 07/02/2010  . Elevated LFTs 07/02/2010  . HTN (hypertension) 08/06/2010  . Hyperlipidemia 09/04/2010    Past Surgical History  Procedure Date  . Broken ankle 1997    right, screws and plates in place  . Tonsillectomy   . Left eye surgery   . Trunk skin lesion excisional biopsy     x twice    Family History  Problem Relation Age of Onset  . Cancer Mother     lung/ smoker  . Heart attack Paternal Grandmother   . Heart disease Father   . Hypertension Father   . Kidney disease Father 36    dialysis  . Obesity Father   . Diabetes type II Father   . Graves' disease Daughter   . Hypothyroidism Daughter     h/o Hashimoto's now hypothyroid  . Cholecystitis Son 43    cholecystectomy    History   Social History  . Marital Status:  Widowed    Spouse Name: N/A    Number of Children: N/A  . Years of Education: N/A   Occupational History  . Not on file.   Social History Main Topics  . Smoking status: Former Smoker -- 0.2 packs/day for 10 years    Types: Cigarettes    Quit date: 04/07/1975  . Smokeless tobacco: Never Used  . Alcohol Use: Yes     1 glass of wine or coctail with dinner  . Drug Use: No  . Sexually Active: No   Other Topics Concern  . Not on file   Social History Narrative  . No narrative on file    Current Outpatient Prescriptions on File Prior to Visit  Medication Sig Dispense Refill  . Ascorbic Acid (VITAMIN C) 500 MG tablet Take 500 mg by mouth daily.        . Calcium Carbonate-Vitamin D (CALTRATE 600+D PO) Take 1 tablet by mouth daily.        . dorzolamide (TRUSOPT) 2 % ophthalmic solution Place 1 drop into the right eye 2 (two) times daily.  10 mL  0  . Flaxseed, Linseed, (FLAXSEED OIL) 1000 MG CAPS Take 1 capsule by  mouth daily.        Marland Kitchen latanoprost (XALATAN) 0.005 % ophthalmic solution Place 1 drop into both eyes at bedtime.        . Milk Thistle 250 MG CAPS Take by mouth. 1-2 tablets daily       . Multiple Vitamins-Minerals (CENTRUM PO) Take 1 tablet by mouth daily.        . Probiotic Product (ALIGN PO) Take by mouth daily.        . timolol (TIMOPTIC) 0.5 % ophthalmic solution Place 1 drop into the right eye 2 (two) times daily.          No Known Allergies  Review of Systems  Review of Systems  Constitutional: Negative for fever and malaise/fatigue.  HENT: Negative for congestion.   Eyes: Negative for discharge.  Respiratory: Negative for shortness of breath.   Cardiovascular: Negative for chest pain, palpitations and leg swelling.  Gastrointestinal: Negative for nausea, abdominal pain and diarrhea.  Genitourinary: Negative for dysuria.  Musculoskeletal: Negative for falls.  Skin: Negative for rash.  Neurological: Negative for loss of consciousness and headaches.    Endo/Heme/Allergies: Negative for polydipsia.  Psychiatric/Behavioral: Negative for depression and suicidal ideas. The patient is not nervous/anxious and does not have insomnia.     Objective  BP 138/78  Pulse 60  Temp(Src) 97.6 F (36.4 C) (Temporal)  Ht 5' 4.75" (1.645 m)  Wt 190 lb (86.183 kg)  BMI 31.86 kg/m2  SpO2 95%  Physical Exam  Physical Exam  Constitutional: She is oriented to person, place, and time and well-developed, well-nourished, and in no distress. No distress.  HENT:  Head: Normocephalic and atraumatic.  Eyes: Conjunctivae are normal.  Neck: Neck supple. No thyromegaly present.  Cardiovascular: Normal rate, regular rhythm and normal heart sounds.   No murmur heard. Pulmonary/Chest: Effort normal and breath sounds normal. She has no wheezes.  Abdominal: She exhibits no distension and no mass.  Musculoskeletal: She exhibits no edema.  Lymphadenopathy:    She has no cervical adenopathy.  Neurological: She is alert and oriented to person, place, and time.  Skin: Skin is warm and dry. No rash noted. She is not diaphoretic.  Psychiatric: Memory, affect and judgment normal.    Lab Results  Component Value Date   TSH 2.40 04/13/2011   Lab Results  Component Value Date   WBC 7.7 04/13/2011   HGB 14.6 04/13/2011   HCT 42.9 04/13/2011   MCV 91.7 04/13/2011   PLT 208.0 04/13/2011   Lab Results  Component Value Date   CREATININE 0.9 04/13/2011   BUN 18 04/13/2011   NA 142 04/13/2011   K 3.7 04/13/2011   CL 103 04/13/2011   CO2 31 04/13/2011   Lab Results  Component Value Date   ALT 58* 04/13/2011   AST 37 04/13/2011   ALKPHOS 93 04/13/2011   BILITOT 0.8 04/13/2011   Lab Results  Component Value Date   CHOL 217* 04/13/2011   Lab Results  Component Value Date   HDL 50.40 04/13/2011   No results found for this basename: [facility]   Lab Results  Component Value Date   TRIG 149.0 04/13/2011   Lab Results  Component Value Date   CHOLHDL 4 04/13/2011     Assessment &  Plan  Thyroid disease Labs wnl and no concerning symptoms.  Preventative health care Given Tdap today  HTN (hypertension) Numbers improved with repeat and [facility] Given Refill on  Chlorthalidone  Elevated LFTs Mild, avoid simple carbs and trans fats, increase exercise and repeat in 6 months  Hyperlipidemia Mild avoid trans fats, increase Flaxseed oil caps to 2 daily and we will continue to monitor

## 2011-04-21 NOTE — Assessment & Plan Note (Signed)
Mild, avoid simple carbs and trans fats, increase exercise and repeat in 6 months

## 2011-04-21 NOTE — Patient Instructions (Signed)

## 2011-04-21 NOTE — Assessment & Plan Note (Signed)
Given Tdap today 

## 2011-04-21 NOTE — Assessment & Plan Note (Signed)
Mild avoid trans fats, increase Flaxseed oil caps to 2 daily and we will continue to monitor

## 2011-04-21 NOTE — Assessment & Plan Note (Signed)
Numbers improved with repeat and c/w what [facility] Given Refill on Chlorthalidone

## 2011-05-11 DIAGNOSIS — B078 Other viral warts: Secondary | ICD-10-CM | POA: Insufficient documentation

## 2011-06-15 DIAGNOSIS — C6930 Malignant neoplasm of unspecified choroid: Secondary | ICD-10-CM | POA: Diagnosis not present

## 2011-06-29 ENCOUNTER — Other Ambulatory Visit: Payer: Self-pay | Admitting: *Deleted

## 2011-06-29 NOTE — Telephone Encounter (Signed)
Faxed refill request received from pharmacy for chlorthalidone Last seen on 04/21/11 Follow up 11/17/11 RX filled x 1 year on 04/21/11.  Per Tammy at pharmacy they do have Rx on file. She will fill.

## 2011-07-14 ENCOUNTER — Other Ambulatory Visit: Payer: Self-pay

## 2011-09-21 DIAGNOSIS — H31019 Macula scars of posterior pole (postinflammatory) (post-traumatic), unspecified eye: Secondary | ICD-10-CM | POA: Diagnosis not present

## 2011-09-21 DIAGNOSIS — H4010X0 Unspecified open-angle glaucoma, stage unspecified: Secondary | ICD-10-CM | POA: Diagnosis not present

## 2011-11-09 ENCOUNTER — Other Ambulatory Visit (INDEPENDENT_AMBULATORY_CARE_PROVIDER_SITE_OTHER): Payer: Medicare Other

## 2011-11-09 DIAGNOSIS — R7989 Other specified abnormal findings of blood chemistry: Secondary | ICD-10-CM

## 2011-11-09 DIAGNOSIS — I10 Essential (primary) hypertension: Secondary | ICD-10-CM | POA: Diagnosis not present

## 2011-11-09 DIAGNOSIS — E785 Hyperlipidemia, unspecified: Secondary | ICD-10-CM

## 2011-11-09 DIAGNOSIS — E079 Disorder of thyroid, unspecified: Secondary | ICD-10-CM

## 2011-11-09 LAB — RENAL FUNCTION PANEL
Albumin: 4.1 g/dL (ref 3.5–5.2)
BUN: 17 mg/dL (ref 6–23)
Calcium: 9.2 mg/dL (ref 8.4–10.5)
Creatinine, Ser: 0.8 mg/dL (ref 0.4–1.2)
Glucose, Bld: 106 mg/dL — ABNORMAL HIGH (ref 70–99)
Potassium: 3.4 mEq/L — ABNORMAL LOW (ref 3.5–5.1)

## 2011-11-09 LAB — CBC
HCT: 43.6 % (ref 36.0–46.0)
Hemoglobin: 14.8 g/dL (ref 12.0–15.0)
RDW: 13.8 % (ref 11.5–14.6)
WBC: 6 10*3/uL (ref 4.5–10.5)

## 2011-11-09 LAB — LIPID PANEL
Cholesterol: 186 mg/dL (ref 0–200)
HDL: 54.2 mg/dL (ref >39.00)
Triglycerides: 93 mg/dL (ref 0.0–149.0)
VLDL: 18.6 mg/dL (ref 0.0–40.0)

## 2011-11-09 LAB — HEPATIC FUNCTION PANEL: Albumin: 4.1 g/dL (ref 3.5–5.2)

## 2011-11-17 ENCOUNTER — Encounter: Payer: Self-pay | Admitting: Family Medicine

## 2011-11-17 ENCOUNTER — Ambulatory Visit (INDEPENDENT_AMBULATORY_CARE_PROVIDER_SITE_OTHER): Payer: Medicare Other | Admitting: Family Medicine

## 2011-11-17 VITALS — BP 161/90 | HR 58 | Temp 97.6°F | Ht 64.75 in | Wt 175.1 lb

## 2011-11-17 DIAGNOSIS — E876 Hypokalemia: Secondary | ICD-10-CM | POA: Diagnosis not present

## 2011-11-17 DIAGNOSIS — I10 Essential (primary) hypertension: Secondary | ICD-10-CM

## 2011-11-17 DIAGNOSIS — J069 Acute upper respiratory infection, unspecified: Secondary | ICD-10-CM

## 2011-11-17 DIAGNOSIS — E079 Disorder of thyroid, unspecified: Secondary | ICD-10-CM | POA: Diagnosis not present

## 2011-11-17 DIAGNOSIS — E785 Hyperlipidemia, unspecified: Secondary | ICD-10-CM

## 2011-11-17 HISTORY — DX: Acute upper respiratory infection, unspecified: J06.9

## 2011-11-17 MED ORDER — LISINOPRIL 5 MG PO TABS: 5.0000 mg | ORAL_TABLET | Freq: Every day | ORAL | Status: DC

## 2011-11-17 NOTE — Assessment & Plan Note (Signed)
Mild, encouraged Mucinex bid, increased rest and fluids and report if symptoms worsen

## 2011-11-17 NOTE — Assessment & Plan Note (Addendum)
Will add Lisinopril 5 mg daily and reassess next month with renal panel and bp check

## 2011-11-17 NOTE — Patient Instructions (Addendum)

## 2011-11-17 NOTE — Assessment & Plan Note (Signed)
TSH within normal limits

## 2011-11-18 ENCOUNTER — Encounter: Payer: Self-pay | Admitting: Family Medicine

## 2011-11-18 DIAGNOSIS — E876 Hypokalemia: Secondary | ICD-10-CM

## 2011-11-18 HISTORY — DX: Hypokalemia: E87.6

## 2011-11-18 NOTE — Assessment & Plan Note (Signed)
No recent flares or concerns. 

## 2011-11-18 NOTE — Progress Notes (Signed)
Patient ID: Karen Hoffman, female   DOB: March 27, 1941, 71 y.o.   MRN: [id number] Karen Hoffman [id number] November 17, 1940 11/18/2011      Progress Note-Follow Up  Subjective  Chief Complaint  Chief Complaint  Patient presents with  . Follow-up    6 month    HPI  Patient is a 71 year old Caucasian female in today for followup. She is doing well for the most part. She has had a little difficulty with some congestion and a mild cough over the last week but does feel it is improving. She has been using Delsym routinely to help with the typical and had decent results. She believes that it has no Sudafed or dextromethorphan in it but she's not sure. She was visiting her grandchildren and scratched her legs on their bunkbed but they were only superficial wounds and she feels the healing well. She denies any recent illness fevers chills, chest pain or palpitations, shortness of breath GI or GU complaints. She's happy with her weight loss has made good effort to exercise and change her diet  Past Medical History  Diagnosis Date  . Malignant melanoma of eye     left   . Cataracts, bilateral   . Glaucoma   . Elevated BP   . Osteoporosis   . Carcinoma in situ of skin of back   . Allergy     improved since move to [place]  . Asthma     mild, intermittent, improved since moving to [place]  . Overweight 07/02/2010  . Thyroid disease 07/02/2010  . History of chicken pox 07/02/2010  . History of measles 07/02/2010  . Elevated LFTs 07/02/2010  . HTN (hypertension) 08/06/2010  . Hyperlipidemia 09/04/2010  . URI (upper respiratory infection) 11/17/2011  . Hypokalemia 11/18/2011    Past Surgical History  Procedure Date  . Broken ankle 1997    right, screws and plates in place  . Tonsillectomy   . Left eye surgery   . Trunk skin lesion excisional biopsy     x twice    Family History  Problem Relation Age of Onset  . Cancer Mother     lung/ smoker  . Heart attack Paternal Grandmother   . Heart disease  Father   . Hypertension Father   . Kidney disease Father 26    dialysis  . Obesity Father   . Diabetes type II Father   . Graves' disease Daughter   . Hypothyroidism Daughter     h/o Hashimoto's now hypothyroid  . Cholecystitis Son 23    cholecystectomy    History   Social History  . Marital Status: Widowed    Spouse Name: N/A    Number of Children: N/A  . Years of Education: N/A   Occupational History  . Not on file.   Social History Main Topics  . Smoking status: Former Smoker -- 0.2 packs/day for 10 years    Types: Cigarettes    Quit date: 04/07/1975  . Smokeless tobacco: Never Used  . Alcohol Use: Yes     1 glass of wine or coctail with dinner  . Drug Use: No  . Sexually Active: No   Other Topics Concern  . Not on file   Social History Narrative  . No narrative on file    Current Outpatient Prescriptions on File Prior to Visit  Medication Sig Dispense Refill  . Ascorbic Acid (VITAMIN C) 500 MG tablet Take 500 mg by mouth daily.        Marland Kitchen  chlorthalidone (HYGROTON) 25 MG tablet Take 1 tablet (25 mg total) by mouth daily.  30 tablet  11  . dorzolamide-timolol (COSOPT) 22.3-6.8 MG/ML ophthalmic solution       . Flaxseed, Linseed, (FLAXSEED OIL) 1000 MG CAPS Take 1 capsule by mouth daily.        Marland Kitchen latanoprost (XALATAN) 0.005 % ophthalmic solution Place 1 drop into both eyes at bedtime.        . Milk Thistle 250 MG CAPS Take by mouth. 1-2 tablets daily       . Multiple Vitamins-Minerals (CENTRUM PO) Take 1 tablet by mouth daily.        . Probiotic Product (ALIGN PO) Take by mouth daily.        Marland Kitchen lisinopril (PRINIVIL,ZESTRIL) 5 MG tablet Take 1 tablet (5 mg total) by mouth daily.  30 tablet  2    No Known Allergies  Review of Systems  Review of Systems  Constitutional: Negative for fever and malaise/fatigue.  HENT: Positive for congestion.   Eyes: Negative for discharge.  Respiratory: Positive for cough. Negative for sputum production and shortness of breath.    Cardiovascular: Negative for chest pain, palpitations and leg swelling.  Gastrointestinal: Negative for nausea, abdominal pain and diarrhea.  Genitourinary: Negative for dysuria.  Musculoskeletal: Negative for falls.  Skin: Negative for rash.  Neurological: Negative for loss of consciousness and headaches.  Endo/Heme/Allergies: Negative for polydipsia.  Psychiatric/Behavioral: Negative for depression and suicidal ideas. The patient is not nervous/anxious and does not have insomnia.     Objective  BP 161/90  Pulse 58  Temp 97.6 F (36.4 C) (Temporal)  Ht 5' 4.75" (1.645 m)  Wt 175 lb 1.9 oz (79.434 kg)  BMI 29.37 kg/m2  SpO2 98%  Physical Exam  Physical Exam  Constitutional: She is oriented to person, place, and time and well-developed, well-nourished, and in no distress. No distress.  HENT:  Head: Normocephalic and atraumatic.  Eyes: Conjunctivae are normal.  Neck: Neck supple. No thyromegaly present.  Cardiovascular: Normal rate, regular rhythm and normal heart sounds.   No murmur heard. Pulmonary/Chest: Effort normal and breath sounds normal. She has no wheezes.  Abdominal: She exhibits no distension and no mass.  Musculoskeletal: She exhibits no edema.  Lymphadenopathy:    She has no cervical adenopathy.  Neurological: She is alert and oriented to person, place, and time.  Skin: Skin is warm and dry. No rash noted. She is not diaphoretic.  Psychiatric: Memory, affect and judgment normal.    Lab Results  Component Value Date   TSH 1.08 11/09/2011   Lab Results  Component Value Date   WBC 6.0 11/09/2011   HGB 14.8 11/09/2011   HCT 43.6 11/09/2011   MCV 92.7 11/09/2011   PLT 183.0 11/09/2011   Lab Results  Component Value Date   CREATININE 0.8 11/09/2011   BUN 17 11/09/2011   NA 137 11/09/2011   K 3.4* 11/09/2011   CL 97 11/09/2011   CO2 32 11/09/2011   Lab Results  Component Value Date   ALT 25 11/09/2011   AST 27 11/09/2011   ALKPHOS 68 11/09/2011   BILITOT 1.0 11/09/2011    Lab Results  Component Value Date   CHOL 186 11/09/2011   Lab Results  Component Value Date   HDL 54.20 11/09/2011   Lab Results  Component Value Date   LDLCALC 113* 11/09/2011   Lab Results  Component Value Date   TRIG 93.0 11/09/2011   Lab Results  Component  Value Date   CHOLHDL 3 11/09/2011     Assessment & Plan  URI (upper respiratory infection) Mild, encouraged Mucinex bid, increased rest and fluids and report if symptoms worsen  Thyroid disease TSH within normal limits  HTN (hypertension) Will add Lisinopril 5 mg daily and reassess next month with renal panel and bp check  Hypokalemia Patient had a potassium of 3.4 and a recent blood draw. She is given a handout on foods containing potassium and she will increase these in her diet we will recheck a renal panel next month or sooner if any concerning symptoms develop.  Hyperlipidemia She showed a good improvement in her cholesterol numbers with her diet and exercise changes, weight loss. She's encouraged to maintain her ongoing attempts. Maintain oil daily and avoid transplants and we'll reassess intermittently.  Asthma No recent flares or concerns

## 2011-11-18 NOTE — Assessment & Plan Note (Signed)
She showed a good improvement in her cholesterol numbers with her diet and exercise changes, weight loss. She's encouraged to maintain her ongoing attempts. Maintain oil daily and avoid transplants and we'll reassess intermittently.

## 2011-11-18 NOTE — Assessment & Plan Note (Signed)
Patient had a potassium of 3.4 and a recent blood draw. She is given a handout on foods containing potassium and she will increase these in her diet we will recheck a renal panel next month or sooner if any concerning symptoms develop.

## 2011-11-19 ENCOUNTER — Encounter: Payer: Self-pay | Admitting: Family Medicine

## 2011-11-19 ENCOUNTER — Telehealth: Payer: Self-pay

## 2011-11-19 NOTE — Telephone Encounter (Signed)
Need in error. 

## 2011-11-19 NOTE — Telephone Encounter (Signed)
Please advise mychart question? 

## 2011-11-20 ENCOUNTER — Encounter: Payer: Self-pay | Admitting: Family Medicine

## 2011-11-30 ENCOUNTER — Encounter (INDEPENDENT_AMBULATORY_CARE_PROVIDER_SITE_OTHER): Payer: Medicare Other | Admitting: Family Medicine

## 2011-11-30 DIAGNOSIS — I10 Essential (primary) hypertension: Secondary | ICD-10-CM | POA: Diagnosis not present

## 2011-11-30 NOTE — Telephone Encounter (Signed)
Please advise 

## 2011-12-01 MED ORDER — LOSARTAN POTASSIUM 25 MG PO TABS: 25.0000 mg | ORAL_TABLET | Freq: Every day | ORAL | Status: DC

## 2011-12-21 ENCOUNTER — Ambulatory Visit: Payer: Medicare Other | Admitting: Family Medicine

## 2011-12-21 DIAGNOSIS — T66XXXS Radiation sickness, unspecified, sequela: Secondary | ICD-10-CM | POA: Diagnosis not present

## 2011-12-21 DIAGNOSIS — Z85840 Personal history of malignant neoplasm of eye: Secondary | ICD-10-CM | POA: Diagnosis not present

## 2011-12-21 DIAGNOSIS — C6920 Malignant neoplasm of unspecified retina: Secondary | ICD-10-CM | POA: Diagnosis not present

## 2011-12-21 DIAGNOSIS — H2510 Age-related nuclear cataract, unspecified eye: Secondary | ICD-10-CM | POA: Diagnosis not present

## 2011-12-21 DIAGNOSIS — C6930 Malignant neoplasm of unspecified choroid: Secondary | ICD-10-CM | POA: Diagnosis not present

## 2011-12-28 ENCOUNTER — Ambulatory Visit (INDEPENDENT_AMBULATORY_CARE_PROVIDER_SITE_OTHER): Payer: Medicare Other | Admitting: Family Medicine

## 2011-12-28 ENCOUNTER — Encounter: Payer: Self-pay | Admitting: Family Medicine

## 2011-12-28 VITALS — BP 131/83 | HR 55 | Temp 98.4°F | Ht 64.75 in | Wt 175.4 lb

## 2011-12-28 DIAGNOSIS — E876 Hypokalemia: Secondary | ICD-10-CM | POA: Diagnosis not present

## 2011-12-28 DIAGNOSIS — Z23 Encounter for immunization: Secondary | ICD-10-CM | POA: Diagnosis not present

## 2011-12-28 DIAGNOSIS — J069 Acute upper respiratory infection, unspecified: Secondary | ICD-10-CM

## 2011-12-28 DIAGNOSIS — I10 Essential (primary) hypertension: Secondary | ICD-10-CM

## 2011-12-28 DIAGNOSIS — Z0000 Encounter for general adult medical examination without abnormal findings: Secondary | ICD-10-CM

## 2011-12-28 DIAGNOSIS — R7989 Other specified abnormal findings of blood chemistry: Secondary | ICD-10-CM

## 2011-12-28 MED ORDER — LOSARTAN POTASSIUM 25 MG PO TABS: 25.0000 mg | ORAL_TABLET | Freq: Every day | ORAL | Status: DC

## 2011-12-28 NOTE — Assessment & Plan Note (Addendum)
Patient declines iFOB, referral for colonoscopy, appointment for pap. Will proceed with MGM next year. Denies any concerning symptoms. She will call her insurance company to check on where she should get her Zostavax and let us know. Flu shot given today

## 2011-12-28 NOTE — Assessment & Plan Note (Addendum)
Doing well on Losartan, cough from ACE nearly resolved, given refills today

## 2011-12-28 NOTE — Assessment & Plan Note (Signed)
resolved 

## 2011-12-28 NOTE — Patient Instructions (Addendum)
Call your insurance company to figure out if they pay for the Zostavax/Shingles shot and where will it be the least expensive out of pocket for you

## 2011-12-28 NOTE — Progress Notes (Signed)
Patient ID: Karen Hoffman, female   DOB: 1940/08/28, 71 y.o.   MRN: [id number] Akari Defelice [id number] March 29, 1941 12/28/2011      Progress Note-Follow Up  Subjective  Chief Complaint  Chief Complaint  Patient presents with  . Follow-up    1 month and labs  . Injections    flu    HPI  Is a 71 year old Caucasian female who is in today for followup. She reports her [facility] is [facility] Right after [facility] to son she had one systolic blood pressure of 97 but since then she's been in the 110s to 120s. She says she feels well. She tends to check it after exercise. No other concerns. The cough and congestion she was struggling with the last has resolved. She agrees to flu shot today. No recent illness, fevers, chills, chest pain, palpitations, shortness of breath, GI or GU complaints since last visit. She denies any breast complaints her GYN complaints at today's visit.  Past Medical History  Diagnosis Date  . Malignant melanoma of eye     left   . Cataracts, bilateral   . Glaucoma   . Elevated BP   . Osteoporosis   . Carcinoma in situ of skin of back   . Allergy     improved since move to [place]  . Asthma     mild, intermittent, improved since moving to [place]  . Overweight 07/02/2010  . Thyroid disease 07/02/2010  . History of chicken pox 07/02/2010  . History of measles 07/02/2010  . Elevated LFTs 07/02/2010  . HTN (hypertension) 08/06/2010  . Hyperlipidemia 09/04/2010  . URI (upper respiratory infection) 11/17/2011  . Hypokalemia 11/18/2011    Past Surgical History  Procedure Date  . Broken ankle 1997    right, screws and plates in place  . Tonsillectomy   . Left eye surgery   . Trunk skin lesion excisional biopsy     x twice    Family History  Problem Relation Age of Onset  . Cancer Mother     lung/ smoker  . Heart attack Paternal Grandmother   . Heart disease Father   . Hypertension Father   . Kidney disease Father 22   dialysis  . Obesity Father   . Diabetes type II Father   . Graves' disease Daughter   . Hypothyroidism Daughter     h/o Hashimoto's now hypothyroid  . Cholecystitis Son 78    cholecystectomy    History   Social History  . Marital Status: Widowed    Spouse Name: N/A    Number of Children: N/A  . Years of Education: N/A   Occupational History  . Not on file.   Social History Main Topics  . Smoking status: Former Smoker -- 0.2 packs/day for 10 years    Types: Cigarettes    Quit date: 04/07/1975  . Smokeless tobacco: Never Used  . Alcohol Use: Yes     1 glass of wine or coctail with dinner  . Drug Use: No  . Sexually Active: No   Other Topics Concern  . Not on file   Social History Narrative  . No narrative on file    Current Outpatient Prescriptions on File Prior to Visit  Medication Sig Dispense Refill  . Ascorbic Acid (VITAMIN C) 500 MG tablet Take 500 mg by mouth daily.        . Calcium Citrate-Vitamin D (CALCIUM CITRATE + PO) Take by mouth daily.      Marland Kitchen  chlorthalidone (HYGROTON) 25 MG tablet Take 1 tablet (25 mg total) by mouth daily.  30 tablet  11  . dorzolamide-timolol (COSOPT) 22.3-6.8 MG/ML ophthalmic solution       . Flaxseed, Linseed, (FLAXSEED OIL) 1000 MG CAPS Take 1 capsule by mouth daily.        Marland Kitchen latanoprost (XALATAN) 0.005 % ophthalmic solution Place 1 drop into both eyes at bedtime.        . Milk Thistle 250 MG CAPS Take by mouth. 1-2 tablets daily       . Multiple Vitamins-Minerals (CENTRUM PO) Take 1 tablet by mouth daily.        . Probiotic Product (ALIGN PO) Take by mouth daily.        Marland Kitchen DISCONTD: losartan (COZAAR) 25 MG tablet Take 1 tablet (25 mg total) by mouth daily.  30 tablet  1    Allergies  Allergen Reactions  . Ace Inhibitors Cough    Review of Systems  Review of Systems  Constitutional: Negative for fever and malaise/fatigue.  HENT: Negative for congestion.   Eyes: Negative for discharge.  Respiratory: Positive for cough.  Negative for shortness of breath.   Cardiovascular: Negative for chest pain, palpitations and leg swelling.  Gastrointestinal: Negative for nausea, abdominal pain and diarrhea.  Genitourinary: Negative for dysuria.  Musculoskeletal: Negative for falls.  Skin: Negative for rash.  Neurological: Negative for loss of consciousness and headaches.  Endo/Heme/Allergies: Negative for polydipsia.  Psychiatric/Behavioral: Negative for depression and suicidal ideas. The patient is not nervous/anxious and does not have insomnia.     Objective  BP 131/83  Pulse 55  Temp 98.4 F (36.9 C) (Temporal)  Ht 5' 4.75" (1.645 m)  Wt 175 lb 6.4 oz (79.561 kg)  BMI 29.41 kg/m2  SpO2 97%  Physical Exam  Physical Exam  Constitutional: She is oriented to person, place, and time and well-developed, well-nourished, and in no distress. No distress.  HENT:  Head: Normocephalic and atraumatic.  Eyes: Conjunctivae normal are normal.  Neck: Neck supple. No thyromegaly present.  Cardiovascular: Normal rate, regular rhythm and normal heart sounds.   No murmur heard. Pulmonary/Chest: Effort normal and breath sounds normal. She has no wheezes.  Abdominal: She exhibits no distension and no mass.  Musculoskeletal: She exhibits no edema.  Lymphadenopathy:    She has no cervical adenopathy.  Neurological: She is alert and oriented to person, place, and time.  Skin: Skin is warm and dry. No rash noted. She is not diaphoretic.  Psychiatric: Memory, affect and judgment normal.    Lab Results  Component Value Date   TSH 1.08 11/09/2011   Lab Results  Component Value Date   WBC 6.0 11/09/2011   HGB 14.8 11/09/2011   HCT 43.6 11/09/2011   MCV 92.7 11/09/2011   PLT 183.0 11/09/2011   Lab Results  Component Value Date   CREATININE 0.8 11/09/2011   BUN 17 11/09/2011   NA 137 11/09/2011   K 3.4* 11/09/2011   CL 97 11/09/2011   CO2 32 11/09/2011   Lab Results  Component Value Date   ALT 25 11/09/2011   AST 27 11/09/2011   ALKPHOS  68 11/09/2011   BILITOT 1.0 11/09/2011   Lab Results  Component Value Date   CHOL 186 11/09/2011   Lab Results  Component Value Date   HDL 54.20 11/09/2011   Lab Results  Component Value Date   LDLCALC 113* 11/09/2011   Lab Results  Component Value Date   TRIG 93.0  11/09/2011   Lab Results  Component Value Date   CHOLHDL 3 11/09/2011     Assessment & Plan  HTN (hypertension) Doing well on Losartan, cough from ACE nearly resolved, given refills today  Elevated LFTs Resolved on previous blood work, will continue to monitor  Hypokalemia Mild, given list of foods containing potassium and recheck a renal panel today  Preventative health care Patient declines iFOB, referral for colonoscopy, appointment for pap. Will proceed with MGM next year. Denies any concerning symptoms. She will call her insurance company to check on where she should get her Zostavax and let us know  URI (upper respiratory infection) resolved

## 2011-12-28 NOTE — Assessment & Plan Note (Signed)
Resolved on previous blood work, will continue to monitor

## 2011-12-28 NOTE — Assessment & Plan Note (Signed)
Mild, given list of foods containing potassium and recheck a renal panel today

## 2011-12-29 LAB — RENAL FUNCTION PANEL
BUN: 19 mg/dL (ref 6–23)
CO2: 29 mEq/L (ref 19–32)
Creatinine, Ser: 0.8 mg/dL (ref 0.4–1.2)
GFR: 73 mL/min (ref >60.00)

## 2011-12-30 ENCOUNTER — Encounter: Payer: Self-pay | Admitting: Family Medicine

## 2011-12-30 ENCOUNTER — Telehealth: Payer: Self-pay

## 2011-12-30 MED ORDER — ZOSTER VACCINE LIVE 19400 UNT/0.65ML ~~LOC~~ SOLR: 0.6500 mL | Freq: Once | SUBCUTANEOUS | Status: DC

## 2011-12-30 NOTE — Telephone Encounter (Signed)
Please advise mychart question? 

## 2011-12-30 NOTE — Telephone Encounter (Signed)
Database administrator and will mail to patient

## 2011-12-30 NOTE — Telephone Encounter (Signed)
Please advise mychart questions

## 2011-12-30 NOTE — Telephone Encounter (Signed)
Message copied by Court Joy on Wed Dec 30, 2011  2:20 PM ------      Message from: Danise Edge A      Created: Wed Dec 30, 2011 12:09 PM       Please print a Zostavax rx for me to sign, I have told her we will mail it to her as she has requested

## 2012-03-14 DIAGNOSIS — H2510 Age-related nuclear cataract, unspecified eye: Secondary | ICD-10-CM | POA: Diagnosis not present

## 2012-03-14 DIAGNOSIS — H4010X0 Unspecified open-angle glaucoma, stage unspecified: Secondary | ICD-10-CM | POA: Diagnosis not present

## 2012-05-17 ENCOUNTER — Other Ambulatory Visit: Payer: Self-pay | Admitting: Family Medicine

## 2012-05-30 DIAGNOSIS — D239 Other benign neoplasm of skin, unspecified: Secondary | ICD-10-CM | POA: Diagnosis not present

## 2012-05-30 DIAGNOSIS — Z85820 Personal history of malignant melanoma of skin: Secondary | ICD-10-CM | POA: Diagnosis not present

## 2012-06-17 ENCOUNTER — Other Ambulatory Visit: Payer: Self-pay | Admitting: Family Medicine

## 2012-06-20 ENCOUNTER — Other Ambulatory Visit (INDEPENDENT_AMBULATORY_CARE_PROVIDER_SITE_OTHER): Payer: Medicare Other

## 2012-06-20 DIAGNOSIS — E079 Disorder of thyroid, unspecified: Secondary | ICD-10-CM | POA: Diagnosis not present

## 2012-06-20 DIAGNOSIS — E785 Hyperlipidemia, unspecified: Secondary | ICD-10-CM

## 2012-06-20 DIAGNOSIS — I10 Essential (primary) hypertension: Secondary | ICD-10-CM

## 2012-06-20 LAB — LIPID PANEL
Total CHOL/HDL Ratio: 4
VLDL: 25.6 mg/dL (ref 0.0–40.0)

## 2012-06-20 LAB — CBC
HCT: 42.8 % (ref 36.0–46.0)
MCV: 89.8 fl (ref 78.0–100.0)
Platelets: 203 10*3/uL (ref 150.0–400.0)
RBC: 4.77 Mil/uL (ref 3.87–5.11)

## 2012-06-20 LAB — HEPATIC FUNCTION PANEL
Alkaline Phosphatase: 69 U/L (ref 39–117)
Bilirubin, Direct: 0.2 mg/dL (ref 0.0–0.3)

## 2012-06-20 LAB — RENAL FUNCTION PANEL
Albumin: 4.1 g/dL (ref 3.5–5.2)
BUN: 17 mg/dL (ref 6–23)
CO2: 31 mEq/L (ref 19–32)
Calcium: 9 mg/dL (ref 8.4–10.5)
Chloride: 95 mEq/L — ABNORMAL LOW (ref 96–112)

## 2012-06-20 NOTE — Progress Notes (Signed)
Labs only

## 2012-06-27 ENCOUNTER — Ambulatory Visit (INDEPENDENT_AMBULATORY_CARE_PROVIDER_SITE_OTHER): Payer: Medicare Other | Admitting: Family Medicine

## 2012-06-27 ENCOUNTER — Encounter: Payer: Self-pay | Admitting: Family Medicine

## 2012-06-27 ENCOUNTER — Ambulatory Visit: Payer: Medicare Other | Admitting: Family Medicine

## 2012-06-27 VITALS — BP 110/82 | HR 58 | Temp 97.9°F | Ht 64.75 in | Wt 174.1 lb

## 2012-06-27 DIAGNOSIS — I10 Essential (primary) hypertension: Secondary | ICD-10-CM

## 2012-06-27 DIAGNOSIS — E663 Overweight: Secondary | ICD-10-CM | POA: Diagnosis not present

## 2012-06-27 DIAGNOSIS — E785 Hyperlipidemia, unspecified: Secondary | ICD-10-CM | POA: Diagnosis not present

## 2012-06-27 DIAGNOSIS — R7989 Other specified abnormal findings of blood chemistry: Secondary | ICD-10-CM | POA: Diagnosis not present

## 2012-06-27 NOTE — Patient Instructions (Addendum)
MegaRed krill oil caps daily Avoid trans fats  Next visit annual exam with labs ahead to include liver, renal, cbc, tsh, lipid  Cholesterol Cholesterol is a white, waxy, fat-like protein needed by your body in small amounts. The liver makes all the cholesterol you need. It is carried from the liver by the blood through the blood vessels. Deposits (plaque) may build up on blood vessel walls. This makes the arteries narrower and stiffer. Plaque increases the risk for heart attack and stroke. You cannot feel your cholesterol level even if it is very high. The only way to know is by a blood test to check your lipid (fats) levels. Once you know your cholesterol levels, you should keep a record of the test results. Work with your caregiver to to keep your levels in the desired range. WHAT THE RESULTS MEAN:  Total cholesterol is a rough measure of all the cholesterol in your blood.  LDL is the so-called bad cholesterol. This is the type that deposits cholesterol in the walls of the arteries. You want this level to be low.  HDL is the good cholesterol because it cleans the arteries and carries the LDL away. You want this level to be high.  Triglycerides are fat that the body can either burn for energy or store. High levels are closely linked to heart disease. DESIRED LEVELS:  Total cholesterol below 200.  LDL below 100 for people at risk, below 70 for very high risk.  HDL above 50 is good, above 60 is best.  Triglycerides below 150. HOW TO LOWER YOUR CHOLESTEROL:  Diet.  Choose fish or white meat chicken and [place], roasted or baked. Limit fatty cuts of red meat, fried foods, and processed meats, such as sausage and lunch meat.  Eat lots of fresh fruits and vegetables. Choose whole grains, beans, pasta, potatoes and cereals.  Use only small amounts of olive, corn or canola oils. Avoid butter, mayonnaise, shortening or palm kernel oils. Avoid foods with trans-fats.  Use skim/nonfat milk  and low-fat/nonfat yogurt and cheeses. Avoid whole milk, cream, ice cream, egg yolks and cheeses. Healthy desserts include angel food cake, ginger snaps, animal crackers, hard candy, popsicles, and low-fat/nonfat frozen yogurt. Avoid pastries, cakes, pies and cookies.  Exercise.  A regular program helps decrease LDL and raises HDL.  Helps with weight control.  Do things that increase your activity level like gardening, walking, or taking the stairs.  Medication.  May be prescribed by your caregiver to help lowering cholesterol and the risk for heart disease.  You may need medicine even if your levels are normal if you have several risk factors. HOME CARE INSTRUCTIONS   Follow your diet and exercise programs as suggested by your caregiver.  Take medications as directed.  Have blood work done when your caregiver feels it is necessary. MAKE SURE YOU:   Understand these instructions.  Will watch your condition.  Will get help right away if you are not doing well or get worse. Document Released: 12/16/2000 Document Revised: 06/15/2011 Document Reviewed: 06/08/2007 [facility] Patient Information 2013 [place], [place].

## 2012-06-28 ENCOUNTER — Encounter: Payer: Self-pay | Admitting: Family Medicine

## 2012-07-03 NOTE — Assessment & Plan Note (Signed)
Encouraged DASH diet and increaed exercise.

## 2012-07-03 NOTE — Assessment & Plan Note (Signed)
Mild, avoid trans fats, add krill oil caps. Increase exercise and minimize saturated fats and simple carbs

## 2012-07-03 NOTE — Progress Notes (Signed)
Patient ID: Karen Hoffman, female   DOB: May 26, 1940, 72 y.o.   MRN: [id number] Karen Hoffman [id number] [birth year] 07/03/2012      Progress Note-Follow Up  Subjective  Chief Complaint  Chief Complaint  Patient presents with  . Follow-up    6 month    HPI  Patient is 72 year old female who is in today for followup. Overall she reports she's doing well. Should not have any illness. Denies any headaches or congestion. No fevers or chills. No chest pain, palpitations, shortness of breath, GI or GU concerns. No recent flare and asthma.  Past Medical History  Diagnosis Date  . Malignant melanoma of eye     left   . Cataracts, bilateral   . Glaucoma(365)   . Elevated BP   . Osteoporosis   . Carcinoma in situ of skin of back   . Allergy     improved since move to [place]  . Asthma     mild, intermittent, improved since moving to [place]  . Overweight 07/02/2010  . Thyroid disease 07/02/2010  . History of chicken pox 07/02/2010  . History of measles 07/02/2010  . Elevated LFTs 07/02/2010  . HTN (hypertension) 08/06/2010  . Hyperlipidemia 09/04/2010  . URI (upper respiratory infection) 11/17/2011  . Hypokalemia 11/18/2011    Past Surgical History  Procedure Laterality Date  . Broken ankle  1997    right, screws and plates in place  . Tonsillectomy    . Left eye surgery    . Trunk skin lesion excisional biopsy      x twice    Family History  Problem Relation Age of Onset  . Cancer Mother     lung/ smoker  . Heart attack Paternal Grandmother   . Heart disease Father   . Hypertension Father   . Kidney disease Father 60    dialysis  . Obesity Father   . Diabetes type II Father   . Graves' disease Daughter   . Hypothyroidism Daughter     h/o Hashimoto's now hypothyroid  . Cholecystitis Son 94    cholecystectomy    History   Social History  . Marital Status: Widowed    Spouse Name: N/A    Number of Children: N/A  . Years of Education: N/A   Occupational History  . Not  on file.   Social History Main Topics  . Smoking status: Former Smoker -- 0.20 packs/day for 10 years    Types: Cigarettes    Quit date: 04/07/1975  . Smokeless tobacco: Never Used  . Alcohol Use: Yes     Comment: 1 glass of wine or coctail with dinner  . Drug Use: No  . Sexually Active: No   Other Topics Concern  . Not on file   Social History Narrative  . No narrative on file    Current Outpatient Prescriptions on File Prior to Visit  Medication Sig Dispense Refill  . Ascorbic Acid (VITAMIN C) 500 MG tablet Take 500 mg by mouth daily.        . Calcium Citrate-Vitamin D (CALCIUM CITRATE + PO) Take by mouth daily.      . chlorthalidone (HYGROTON) 25 MG tablet TAKE 1 TABLET (25 MG TOTAL) BY MOUTH DAILY.  30 tablet  10  . dorzolamide-timolol (COSOPT) 22.3-6.8 MG/ML ophthalmic solution       . Flaxseed, Linseed, (FLAXSEED OIL) 1000 MG CAPS Take 1 capsule by mouth daily.        Marland Kitchen latanoprost (XALATAN) 0.005 % ophthalmic  solution Place 1 drop into both eyes at bedtime.        Marland Kitchen losartan (COZAAR) 25 MG tablet TAKE 1 TABLET (25 MG TOTAL) BY MOUTH DAILY.  30 tablet  5  . Milk Thistle 250 MG CAPS Take by mouth. 1-2 tablets daily       . Multiple Vitamins-Minerals (CENTRUM PO) Take 1 tablet by mouth daily.        . Probiotic Product (ALIGN PO) Take by mouth daily.        Marland Kitchen zoster vaccine live, PF, (ZOSTAVAX) 16109 UNT/0.65ML injection Inject 19,400 Units into the skin once.  1 each  0   No current facility-administered medications on file prior to visit.    Allergies  Allergen Reactions  . Ace Inhibitors Cough    Review of Systems  Review of Systems  Constitutional: Negative for fever and malaise/fatigue.  HENT: Negative for congestion.   Eyes: Negative for discharge.  Respiratory: Negative for shortness of breath.   Cardiovascular: Negative for chest pain, palpitations and leg swelling.  Gastrointestinal: Negative for nausea, abdominal pain and diarrhea.  Genitourinary:  Negative for dysuria.  Musculoskeletal: Negative for falls.  Skin: Negative for rash.  Neurological: Negative for loss of consciousness and headaches.  Endo/Heme/Allergies: Negative for polydipsia.  Psychiatric/Behavioral: Negative for depression and suicidal ideas. The patient is not nervous/anxious and does not have insomnia.     Objective  BP 110/82  Pulse 58  Temp(Src) 97.9 F (36.6 C) (Oral)  Ht 5' 4.75" (1.645 m)  Wt 174 lb 1.3 oz (78.962 kg)  BMI 29.18 kg/m2  SpO2 97%  Physical Exam  Physical Exam  Constitutional: She is oriented to person, place, and time and well-developed, well-nourished, and in no distress. No distress.  HENT:  Head: Normocephalic and atraumatic.  Eyes: Conjunctivae are normal.  Neck: Neck supple. No thyromegaly present.  Cardiovascular: Normal rate and regular rhythm.  Exam reveals gallop.   No murmur heard. Pulmonary/Chest: Effort normal and breath sounds normal. She has no wheezes.  Abdominal: She exhibits no distension and no mass.  Musculoskeletal: She exhibits no edema.  Lymphadenopathy:    She has no cervical adenopathy.  Neurological: She is alert and oriented to person, place, and time.  Skin: Skin is warm and dry. No rash noted. She is not diaphoretic.  Psychiatric: Memory, affect and judgment normal.    Lab Results  Component Value Date   TSH 2.54 06/20/2012   Lab Results  Component Value Date   WBC 7.4 06/20/2012   HGB 14.5 06/20/2012   HCT 42.8 06/20/2012   MCV 89.8 06/20/2012   PLT 203.0 06/20/2012   Lab Results  Component Value Date   CREATININE 0.8 06/20/2012   BUN 17 06/20/2012   NA 135 06/20/2012   K 3.4* 06/20/2012   CL 95* 06/20/2012   CO2 31 06/20/2012   Lab Results  Component Value Date   ALT 24 06/20/2012   AST 22 06/20/2012   ALKPHOS 69 06/20/2012   BILITOT 0.9 06/20/2012   Lab Results  Component Value Date   CHOL 216* 06/20/2012   Lab Results  Component Value Date   HDL 49.00 06/20/2012   Lab Results   Component Value Date   LDLCALC 113* 11/09/2011   Lab Results  Component Value Date   TRIG 128.0 06/20/2012   Lab Results  Component Value Date   CHOLHDL 4 06/20/2012     Assessment & Plan  HTN (hypertension) Well controlled, no changes. Consider DASH diet.  Elevated LFTs normalized  Hyperlipidemia Mild, avoid trans fats, add krill oil caps. Increase exercise and minimize saturated fats and simple carbs  Overweight Encouraged DASH diet and increaed exercise.

## 2012-07-03 NOTE — Assessment & Plan Note (Signed)
normalized 

## 2012-07-03 NOTE — Assessment & Plan Note (Signed)
Well controlled, no changes. Consider DASH diet.

## 2012-07-06 DIAGNOSIS — C6930 Malignant neoplasm of unspecified choroid: Secondary | ICD-10-CM | POA: Diagnosis not present

## 2012-09-19 DIAGNOSIS — H2510 Age-related nuclear cataract, unspecified eye: Secondary | ICD-10-CM | POA: Diagnosis not present

## 2012-09-19 DIAGNOSIS — H4010X0 Unspecified open-angle glaucoma, stage unspecified: Secondary | ICD-10-CM | POA: Diagnosis not present

## 2012-09-19 DIAGNOSIS — H35039 Hypertensive retinopathy, unspecified eye: Secondary | ICD-10-CM | POA: Diagnosis not present

## 2012-09-19 DIAGNOSIS — H31019 Macula scars of posterior pole (postinflammatory) (post-traumatic), unspecified eye: Secondary | ICD-10-CM | POA: Diagnosis not present

## 2012-10-31 ENCOUNTER — Encounter: Payer: Self-pay | Admitting: Family Medicine

## 2012-10-31 ENCOUNTER — Other Ambulatory Visit: Payer: Self-pay

## 2012-10-31 DIAGNOSIS — Z1231 Encounter for screening mammogram for malignant neoplasm of breast: Secondary | ICD-10-CM

## 2012-11-04 ENCOUNTER — Other Ambulatory Visit: Payer: Self-pay | Admitting: Family Medicine

## 2012-11-09 ENCOUNTER — Other Ambulatory Visit: Payer: Self-pay

## 2012-11-16 ENCOUNTER — Ambulatory Visit
Admission: RE | Admit: 2012-11-16 | Discharge: 2012-11-16 | Disposition: A | Discharge status: Home / Self Care | Payer: Medicare Other | Source: Ambulatory Visit

## 2012-11-16 DIAGNOSIS — Z1231 Encounter for screening mammogram for malignant neoplasm of breast: Secondary | ICD-10-CM | POA: Diagnosis not present

## 2012-12-12 ENCOUNTER — Other Ambulatory Visit (INDEPENDENT_AMBULATORY_CARE_PROVIDER_SITE_OTHER): Payer: Medicare Other

## 2012-12-12 DIAGNOSIS — I10 Essential (primary) hypertension: Secondary | ICD-10-CM | POA: Diagnosis not present

## 2012-12-12 DIAGNOSIS — E785 Hyperlipidemia, unspecified: Secondary | ICD-10-CM | POA: Diagnosis not present

## 2012-12-12 DIAGNOSIS — E663 Overweight: Secondary | ICD-10-CM

## 2012-12-12 DIAGNOSIS — Z0000 Encounter for general adult medical examination without abnormal findings: Secondary | ICD-10-CM

## 2012-12-12 LAB — LIPID PANEL
Cholesterol: 201 mg/dL — ABNORMAL HIGH (ref 0–200)
Total CHOL/HDL Ratio: 5
Triglycerides: 202 mg/dL — ABNORMAL HIGH (ref 0.0–149.0)
VLDL: 40.4 mg/dL — ABNORMAL HIGH (ref 0.0–40.0)

## 2012-12-12 LAB — CBC
RBC: 4.8 Mil/uL (ref 3.87–5.11)
RDW: 13.3 % (ref 11.5–14.6)
WBC: 7.8 10*3/uL (ref 4.5–10.5)

## 2012-12-12 LAB — RENAL FUNCTION PANEL
Chloride: 101 mEq/L (ref 96–112)
GFR: 91.92 mL/min (ref >60.00)
Glucose, Bld: 89 mg/dL (ref 70–99)
Phosphorus: 3.4 mg/dL (ref 2.3–4.6)
Potassium: 3.8 mEq/L (ref 3.5–5.1)
Sodium: 138 mEq/L (ref 135–145)

## 2012-12-12 LAB — HEPATIC FUNCTION PANEL
ALT: 35 U/L (ref 0–35)
Albumin: 4.1 g/dL (ref 3.5–5.2)
Total Protein: 6.5 g/dL (ref 6.0–8.3)

## 2012-12-12 NOTE — Progress Notes (Signed)
Labs only

## 2012-12-19 DIAGNOSIS — H2510 Age-related nuclear cataract, unspecified eye: Secondary | ICD-10-CM | POA: Diagnosis not present

## 2012-12-19 DIAGNOSIS — C6930 Malignant neoplasm of unspecified choroid: Secondary | ICD-10-CM | POA: Diagnosis not present

## 2012-12-19 DIAGNOSIS — H35389 Toxic maculopathy, unspecified eye: Secondary | ICD-10-CM | POA: Diagnosis not present

## 2012-12-26 ENCOUNTER — Encounter: Payer: Self-pay | Admitting: Family Medicine

## 2012-12-26 ENCOUNTER — Ambulatory Visit: Payer: Medicare Other | Admitting: Family Medicine

## 2012-12-26 ENCOUNTER — Ambulatory Visit (INDEPENDENT_AMBULATORY_CARE_PROVIDER_SITE_OTHER): Payer: Medicare Other | Admitting: Family Medicine

## 2012-12-26 VITALS — BP 118/82 | HR 60 | Temp 97.7°F | Ht 64.75 in | Wt 176.0 lb

## 2012-12-26 DIAGNOSIS — C6990 Malignant neoplasm of unspecified site of unspecified eye: Secondary | ICD-10-CM

## 2012-12-26 DIAGNOSIS — E876 Hypokalemia: Secondary | ICD-10-CM | POA: Diagnosis not present

## 2012-12-26 DIAGNOSIS — I10 Essential (primary) hypertension: Secondary | ICD-10-CM | POA: Diagnosis not present

## 2012-12-26 DIAGNOSIS — Z23 Encounter for immunization: Secondary | ICD-10-CM

## 2012-12-26 DIAGNOSIS — C6992 Malignant neoplasm of unspecified site of left eye: Secondary | ICD-10-CM

## 2012-12-26 DIAGNOSIS — E079 Disorder of thyroid, unspecified: Secondary | ICD-10-CM

## 2012-12-26 DIAGNOSIS — E785 Hyperlipidemia, unspecified: Secondary | ICD-10-CM

## 2012-12-26 NOTE — Progress Notes (Signed)
Patient ID: Karen Hoffman, female   DOB: [birth year], 72 y.o.   MRN: [id number] Karen Hoffman [id number] [birth year] 12/26/2012      Progress Note-Follow Up  Subjective  Chief Complaint  Chief Complaint  Patient presents with  . Follow-up  . Injections    flu    HPI  Patient is a 72 year old Caucasian female who is in today for followup. No recent illness. She's recently returned from [place] for her eye checkup. Has been taking her Krill oil routinely. Agrees to take her flu shot today. No chest pain or palpitations. No shortness of breath, fevers, headaches, GI or GU concerns noted today. Taking medications as prescribed  Past Medical History  Diagnosis Date  . Malignant melanoma of eye     left   . Cataracts, bilateral   . Glaucoma   . Elevated BP   . Osteoporosis   . Carcinoma in situ of skin of back   . Allergy     improved since move to [place]  . Asthma     mild, intermittent, improved since moving to [place]  . Overweight(278.02) 07/02/2010  . Thyroid disease 07/02/2010  . History of chicken pox 07/02/2010  . History of measles 07/02/2010  . Elevated LFTs 07/02/2010  . HTN (hypertension) 08/06/2010  . Hyperlipidemia 09/04/2010  . URI (upper respiratory infection) 11/17/2011  . Hypokalemia 11/18/2011    Past Surgical History  Procedure Laterality Date  . Broken ankle  1997    right, screws and plates in place  . Tonsillectomy    . Left eye surgery    . Trunk skin lesion excisional biopsy      x twice    Family History  Problem Relation Age of Onset  . Cancer Mother     lung/ smoker  . Heart attack Paternal Grandmother   . Heart disease Father   . Hypertension Father   . Kidney disease Father 26    dialysis  . Obesity Father   . Diabetes type II Father   . Graves' disease Daughter   . Hypothyroidism Daughter     h/o Hashimoto's now hypothyroid  . Cholecystitis Son 75    cholecystectomy    History   Social History  . Marital Status: Widowed    Spouse  Name: N/A    Number of Children: N/A  . Years of Education: N/A   Occupational History  . Not on file.   Social History Main Topics  . Smoking status: Former Smoker -- 0.20 packs/day for 10 years    Types: Cigarettes    Quit date: 04/07/1975  . Smokeless tobacco: Never Used  . Alcohol Use: Yes     Comment: 1 glass of wine or coctail with dinner  . Drug Use: No  . Sexual Activity: No   Other Topics Concern  . Not on file   Social History Narrative  . No narrative on file    Current Outpatient Prescriptions on File Prior to Visit  Medication Sig Dispense Refill  . Ascorbic Acid (VITAMIN C) 500 MG tablet Take 500 mg by mouth daily.        . Calcium Citrate-Vitamin D (CALCIUM CITRATE + PO) Take by mouth daily.      . chlorthalidone (HYGROTON) 25 MG tablet TAKE 1 TABLET (25 MG TOTAL) BY MOUTH DAILY.  30 tablet  10  . dorzolamide-timolol (COSOPT) 22.3-6.8 MG/ML ophthalmic solution       . Flaxseed, Linseed, (FLAXSEED OIL) 1000 MG CAPS Take 1  capsule by mouth daily.        Marland Kitchen latanoprost (XALATAN) 0.005 % ophthalmic solution Place 1 drop into both eyes at bedtime.        Marland Kitchen losartan (COZAAR) 25 MG tablet TAKE 1 TABLET BY MOUTH EVERY DAY  30 tablet  5  . Milk Thistle 250 MG CAPS Take by mouth. 1-2 tablets daily       . Multiple Vitamins-Minerals (CENTRUM PO) Take 1 tablet by mouth daily.        . Probiotic Product (ALIGN PO) Take by mouth daily.         No current facility-administered medications on file prior to visit.    Allergies  Allergen Reactions  . Ace Inhibitors Cough    Review of Systems  Review of Systems  Constitutional: Negative for fever and malaise/fatigue.  HENT: Negative for congestion.   Eyes: Negative for discharge.  Respiratory: Negative for shortness of breath.   Cardiovascular: Negative for chest pain, palpitations and leg swelling.  Gastrointestinal: Negative for nausea, abdominal pain and diarrhea.  Genitourinary: Negative for dysuria.   Musculoskeletal: Negative for falls.  Skin: Negative for rash.  Neurological: Negative for loss of consciousness and headaches.  Endo/Heme/Allergies: Negative for polydipsia.  Psychiatric/Behavioral: Negative for depression and suicidal ideas. The patient is not nervous/anxious and does not have insomnia.     Objective  BP 118/82  Pulse 60  Temp(Src) 97.7 F (36.5 C) (Oral)  Ht 5' 4.75" (1.645 m)  Wt 176 lb 0.6 oz (79.851 kg)  BMI 29.51 kg/m2  SpO2 97%  Physical Exam  Physical Exam  Constitutional: She is oriented to person, place, and time and well-developed, well-nourished, and in no distress. No distress.  HENT:  Head: Normocephalic and atraumatic.  Eyes: Conjunctivae are normal.  Neck: Neck supple. No thyromegaly present.  Cardiovascular: Normal rate, regular rhythm and normal heart sounds.   No murmur heard. Pulmonary/Chest: Effort normal and breath sounds normal. She has no wheezes.  Abdominal: She exhibits no distension and no mass.  Musculoskeletal: She exhibits no edema.  Lymphadenopathy:    She has no cervical adenopathy.  Neurological: She is alert and oriented to person, place, and time.  Skin: Skin is warm and dry. No rash noted. She is not diaphoretic.  Psychiatric: Memory, affect and judgment normal.    Lab Results  Component Value Date   TSH 2.28 12/12/2012   Lab Results  Component Value Date   WBC 7.8 12/12/2012   HGB 14.8 12/12/2012   HCT 43.0 12/12/2012   MCV 89.6 12/12/2012   PLT 183.0 12/12/2012   Lab Results  Component Value Date   CREATININE 0.7 12/12/2012   BUN 14 12/12/2012   NA 138 12/12/2012   K 3.8 12/12/2012   CL 101 12/12/2012   CO2 30 12/12/2012   Lab Results  Component Value Date   ALT 35 12/12/2012   AST 27 12/12/2012   ALKPHOS 76 12/12/2012   BILITOT 0.9 12/12/2012   Lab Results  Component Value Date   CHOL 201* 12/12/2012   Lab Results  Component Value Date   HDL 44.10 12/12/2012   Lab Results  Component Value Date   LDLCALC 113* 11/09/2011    Lab Results  Component Value Date   TRIG 202.0* 12/12/2012   Lab Results  Component Value Date   CHOLHDL 5 12/12/2012     Assessment & Plan    Hypokalemia resolved  HTN (hypertension) Well controlled, no changes.  Malignant melanoma of eye Doing  well had an appt in [place] recently for follow up  Hyperlipidemia Avoid simple carbs and trans fats, increase exercise and fiber contin to monitor

## 2012-12-26 NOTE — Patient Instructions (Addendum)

## 2012-12-26 NOTE — Assessment & Plan Note (Signed)
Well controlled, no changes 

## 2012-12-26 NOTE — Assessment & Plan Note (Signed)
Avoid simple carbs and trans fats, increase exercise and fiber contin to monitor

## 2012-12-26 NOTE — Assessment & Plan Note (Signed)
Doing well had an appt in [place] recently for follow up

## 2012-12-26 NOTE — Assessment & Plan Note (Signed)
resolved 

## 2013-03-15 ENCOUNTER — Telehealth: Payer: Self-pay | Admitting: Family Medicine

## 2013-03-15 MED ORDER — CHLORTHALIDONE 25 MG PO TABS: 25.0000 mg | ORAL_TABLET | Freq: Every day | ORAL | Status: DC

## 2013-03-15 NOTE — Telephone Encounter (Signed)
CHLORTHALIDONE 25 MG TAKE 1 PER DAY QTY 30

## 2013-04-10 DIAGNOSIS — H43399 Other vitreous opacities, unspecified eye: Secondary | ICD-10-CM | POA: Diagnosis not present

## 2013-04-10 DIAGNOSIS — H31019 Macula scars of posterior pole (postinflammatory) (post-traumatic), unspecified eye: Secondary | ICD-10-CM | POA: Diagnosis not present

## 2013-04-10 DIAGNOSIS — H4011X0 Primary open-angle glaucoma, stage unspecified: Secondary | ICD-10-CM | POA: Diagnosis not present

## 2013-04-10 DIAGNOSIS — H2510 Age-related nuclear cataract, unspecified eye: Secondary | ICD-10-CM | POA: Diagnosis not present

## 2013-04-17 ENCOUNTER — Telehealth: Payer: Self-pay | Admitting: Family Medicine

## 2013-04-17 NOTE — Telephone Encounter (Signed)
refill-losartan potassium 25mg  tab. Take one tablet by mouth every day. Qty 30 last fill 1.7.15

## 2013-04-18 DIAGNOSIS — L570 Actinic keratosis: Secondary | ICD-10-CM | POA: Diagnosis not present

## 2013-04-18 DIAGNOSIS — L820 Inflamed seborrheic keratosis: Secondary | ICD-10-CM | POA: Diagnosis not present

## 2013-04-18 MED ORDER — LOSARTAN POTASSIUM 25 MG PO TABS: 25.0000 mg | ORAL_TABLET | Freq: Every day | ORAL | Status: DC

## 2013-05-10 ENCOUNTER — Telehealth: Payer: Self-pay | Admitting: Family Medicine

## 2013-05-10 NOTE — Telephone Encounter (Signed)
Refill-chlorthalidone 25mg 

## 2013-05-11 MED ORDER — CHLORTHALIDONE 25 MG PO TABS: 25.0000 mg | ORAL_TABLET | Freq: Every day | ORAL | Status: DC

## 2013-06-12 ENCOUNTER — Other Ambulatory Visit (INDEPENDENT_AMBULATORY_CARE_PROVIDER_SITE_OTHER): Payer: Medicare Other

## 2013-06-12 ENCOUNTER — Telehealth: Payer: Self-pay

## 2013-06-12 DIAGNOSIS — Z0000 Encounter for general adult medical examination without abnormal findings: Secondary | ICD-10-CM

## 2013-06-12 DIAGNOSIS — R945 Abnormal results of liver function studies: Secondary | ICD-10-CM

## 2013-06-12 DIAGNOSIS — E785 Hyperlipidemia, unspecified: Secondary | ICD-10-CM

## 2013-06-12 DIAGNOSIS — R7989 Other specified abnormal findings of blood chemistry: Secondary | ICD-10-CM

## 2013-06-12 DIAGNOSIS — E079 Disorder of thyroid, unspecified: Secondary | ICD-10-CM

## 2013-06-12 DIAGNOSIS — I10 Essential (primary) hypertension: Secondary | ICD-10-CM

## 2013-06-12 LAB — RENAL FUNCTION PANEL
Albumin: 4.2 g/dL (ref 3.5–5.2)
BUN: 23 mg/dL (ref 6–23)
CO2: 31 mEq/L (ref 19–32)
Calcium: 9.3 mg/dL (ref 8.4–10.5)
Chloride: 99 mEq/L (ref 96–112)
Creatinine, Ser: 0.8 mg/dL (ref 0.4–1.2)
GFR: 75.9 mL/min (ref >60.00)
GLUCOSE: 90 mg/dL (ref 70–99)
POTASSIUM: 3.3 meq/L — AB (ref 3.5–5.1)
Phosphorus: 3.6 mg/dL (ref 2.3–4.6)
SODIUM: 139 meq/L (ref 135–145)

## 2013-06-12 LAB — CBC
HEMATOCRIT: 43.3 % (ref 36.0–46.0)
Hemoglobin: 14.5 g/dL (ref 12.0–15.0)
MCHC: 33.6 g/dL (ref 30.0–36.0)
MCV: 90 fl (ref 78.0–100.0)
Platelets: 173 10*3/uL (ref 150.0–400.0)
RBC: 4.81 Mil/uL (ref 3.87–5.11)
RDW: 13.1 % (ref 11.5–14.6)
WBC: 6.1 10*3/uL (ref 4.5–10.5)

## 2013-06-12 LAB — HEPATIC FUNCTION PANEL
ALK PHOS: 65 U/L (ref 39–117)
ALT: 26 U/L (ref 0–35)
AST: 24 U/L (ref 0–37)
Albumin: 4.2 g/dL (ref 3.5–5.2)
BILIRUBIN DIRECT: 0.1 mg/dL (ref 0.0–0.3)
BILIRUBIN TOTAL: 0.9 mg/dL (ref 0.3–1.2)
Total Protein: 6.8 g/dL (ref 6.0–8.3)

## 2013-06-12 LAB — LIPID PANEL
Cholesterol: 217 mg/dL — ABNORMAL HIGH (ref 0–200)
HDL: 49.4 mg/dL (ref >39.00)
LDL CALC: 150 mg/dL — AB (ref 0–99)
Total CHOL/HDL Ratio: 4
Triglycerides: 86 mg/dL (ref 0.0–149.0)
VLDL: 17.2 mg/dL (ref 0.0–40.0)

## 2013-06-12 LAB — TSH: TSH: 1.14 u[IU]/mL (ref 0.35–5.50)

## 2013-06-12 NOTE — Telephone Encounter (Signed)
Lab order placed.

## 2013-06-19 ENCOUNTER — Encounter: Payer: Self-pay | Admitting: Family Medicine

## 2013-06-19 ENCOUNTER — Ambulatory Visit (INDEPENDENT_AMBULATORY_CARE_PROVIDER_SITE_OTHER): Payer: Medicare Other | Admitting: Family Medicine

## 2013-06-19 ENCOUNTER — Other Ambulatory Visit (HOSPITAL_COMMUNITY)
Admission: RE | Admit: 2013-06-19 | Discharge: 2013-06-19 | Disposition: A | Discharge status: Home / Self Care | Payer: Medicare Other | Source: Ambulatory Visit | Attending: Family Medicine | Admitting: Family Medicine

## 2013-06-19 VITALS — BP 110/78 | HR 54 | Temp 97.7°F | Ht 64.75 in | Wt 174.0 lb

## 2013-06-19 DIAGNOSIS — E785 Hyperlipidemia, unspecified: Secondary | ICD-10-CM

## 2013-06-19 DIAGNOSIS — Z124 Encounter for screening for malignant neoplasm of cervix: Secondary | ICD-10-CM | POA: Diagnosis not present

## 2013-06-19 DIAGNOSIS — Z23 Encounter for immunization: Secondary | ICD-10-CM | POA: Diagnosis not present

## 2013-06-19 DIAGNOSIS — E663 Overweight: Secondary | ICD-10-CM

## 2013-06-19 DIAGNOSIS — E876 Hypokalemia: Secondary | ICD-10-CM

## 2013-06-19 DIAGNOSIS — I10 Essential (primary) hypertension: Secondary | ICD-10-CM | POA: Diagnosis not present

## 2013-06-19 DIAGNOSIS — Z0000 Encounter for general adult medical examination without abnormal findings: Secondary | ICD-10-CM

## 2013-06-19 HISTORY — DX: Encounter for screening for malignant neoplasm of cervix: Z12.4

## 2013-06-19 LAB — RENAL FUNCTION PANEL
Albumin: 4.2 g/dL (ref 3.5–5.2)
BUN: 18 mg/dL (ref 6–23)
CHLORIDE: 98 meq/L (ref 96–112)
CO2: 33 mEq/L — ABNORMAL HIGH (ref 19–32)
Calcium: 9.6 mg/dL (ref 8.4–10.5)
Creat: 0.78 mg/dL (ref 0.50–1.10)
Glucose, Bld: 94 mg/dL (ref 70–99)
Phosphorus: 4 mg/dL (ref 2.3–4.6)
Potassium: 3.4 mEq/L — ABNORMAL LOW (ref 3.5–5.3)
SODIUM: 139 meq/L (ref 135–145)

## 2013-06-19 MED ORDER — PNEUMOCOCCAL 13-VAL CONJ VACC IM SUSP: 0.5000 mL | Freq: Once | INTRAMUSCULAR | Status: DC

## 2013-06-19 MED ORDER — POTASSIUM CHLORIDE ER 10 MEQ PO TBCR: 10.0000 meq | EXTENDED_RELEASE_TABLET | Freq: Every day | ORAL | Status: DC

## 2013-06-19 NOTE — Patient Instructions (Signed)
Hypokalemia Hypokalemia means that the amount of potassium in the blood is lower than normal.Potassium is a chemical, called an electrolyte, that helps regulate the amount of fluid in the body. It also stimulates muscle contraction and helps nerves function properly.Most of the body's potassium is inside of cells, and only a very small amount is in the blood. Because the amount in the blood is so small, minor changes can be life-threatening. CAUSES  Antibiotics.  Diarrhea or vomiting.  Using laxatives too much, which can cause diarrhea.  Chronic kidney disease.  Water pills (diuretics).  Eating disorders (bulimia).  Low magnesium level.  Sweating a lot. SIGNS AND SYMPTOMS  Weakness.  Constipation.  Fatigue.  Muscle cramps.  Mental confusion.  Skipped heartbeats or irregular heartbeat (palpitations).  Tingling or numbness. DIAGNOSIS  Your health care provider can diagnose hypokalemia with blood tests. In addition to checking your potassium level, your health care provider may also check other lab tests. TREATMENT Hypokalemia can be treated with potassium supplements taken by mouth or adjustments in your current medicines. If your potassium level is very low, you may need to get potassium through a vein (IV) and be monitored in the hospital. A diet high in potassium is also helpful. Foods high in potassium are:  Nuts, such as peanuts and pistachios.  Seeds, such as sunflower seeds and pumpkin seeds.  Peas, lentils, and lima beans.  Whole grain and bran cereals and breads.  Fresh fruit and vegetables, such as apricots, avocado, bananas, cantaloupe, kiwi, oranges, tomatoes, asparagus, and potatoes.  Orange and tomato juices.  Red meats.  Fruit yogurt. HOME CARE INSTRUCTIONS  Take all medicines as prescribed by your health care provider.  Maintain a healthy diet by including nutritious food, such as fruits, vegetables, nuts, whole grains, and lean meats.  If  you are taking a laxative, be sure to follow the directions on the label. SEEK MEDICAL CARE IF:  Your weakness gets worse.  You feel your heart pounding or racing.  You are vomiting or having diarrhea.  You are diabetic and having trouble keeping your blood glucose in the normal range. SEEK IMMEDIATE MEDICAL CARE IF:  You have chest pain, shortness of breath, or dizziness.  You are vomiting or having diarrhea for more than 2 days.  You faint. MAKE SURE YOU:   Understand these instructions.  Will watch your condition.  Will get help right away if you are not doing well or get worse. Document Released: 03/23/2005 Document Revised: 01/11/2013 Document Reviewed: 09/23/2012 [employer] Patient Information 2014 [employer].  

## 2013-06-19 NOTE — Progress Notes (Signed)
Patient ID: Karen Hoffman, female   DOB: February 01, 1941, 73 y.o.   MRN: [id number] Karen Hoffman [id number] 03/11/1941 06/19/2013      Progress Note-Follow Up  Subjective  Chief Complaint  Chief Complaint  Patient presents with  . Annual Exam    physical  . Gynecologic Exam    pap  . Injections    prevnar    HPI  Patient is a 73 year old female in today for routine medical care. She is to. She offers no concerns at home activity or ADLs. She had last eye exam in December of 2014 things are going well. Continues to follow with her eye doctor in [place] for her melanoma. Offers no GYN concerns. Is taking a potassium over-the-counter supplement. No recent illness. Denies CP/palp/SOB/HA/congestion/fevers/GI or GU c/o. Taking meds as prescribed  Past Medical History  Diagnosis Date  . Malignant melanoma of eye     left   . Cataracts, bilateral   . Glaucoma   . Elevated BP   . Osteoporosis   . Carcinoma in situ of skin of back   . Allergy     improved since move to [place]  . Asthma     mild, intermittent, improved since moving to [place]  . Overweight 07/02/2010  . Thyroid disease 07/02/2010  . History of chicken pox 07/02/2010  . History of measles 07/02/2010  . Elevated LFTs 07/02/2010  . HTN (hypertension) 08/06/2010  . Hyperlipidemia 09/04/2010  . URI (upper respiratory infection) 11/17/2011  . Hypokalemia 11/18/2011  . Cervical cancer screening 06/19/2013    Past Surgical History  Procedure Laterality Date  . Broken ankle  1997    right, screws and plates in place  . Tonsillectomy    . Left eye surgery    . Trunk skin lesion excisional biopsy      x twice    Family History  Problem Relation Age of Onset  . Cancer Mother     lung/ smoker  . Heart attack Paternal Grandmother   . Heart disease Father   . Hypertension Father   . Kidney disease Father 96    dialysis  . Obesity Father   . Diabetes type II Father   . Graves' disease Daughter   . Hypothyroidism Daughter     h/o Hashimoto's now hypothyroid  . Cholecystitis Son 2    cholecystectomy    History   Social History  . Marital Status: Widowed    Spouse Name: N/A    Number of Children: N/A  . Years of Education: N/A   Occupational History  . Not on file.   Social History Main Topics  . Smoking status: Former Smoker -- 0.20 packs/day for 10 years    Types: Cigarettes    Quit date: 04/07/1975  . Smokeless tobacco: Never Used  . Alcohol Use: Yes     Comment: 1 glass of wine or coctail with dinner  . Drug Use: No  . Sexual Activity: No   Other Topics Concern  . Not on file   Social History Narrative  . No narrative on file    Current Outpatient Prescriptions on File Prior to Visit  Medication Sig Dispense Refill  . Ascorbic Acid (VITAMIN C) 500 MG tablet Take 500 mg by mouth daily.        . Calcium Citrate-Vitamin D (CALCIUM CITRATE + PO) Take by mouth daily.      . chlorthalidone (HYGROTON) 25 MG tablet Take 1 tablet (25 mg total) by  mouth daily.  30 tablet  2  . dorzolamide-timolol (COSOPT) 22.3-6.8 MG/ML ophthalmic solution       . Flaxseed, Linseed, (FLAXSEED OIL) 1000 MG CAPS Take 1 capsule by mouth daily.        Marland Kitchen latanoprost (XALATAN) 0.005 % ophthalmic solution Place 1 drop into both eyes at bedtime.        Marland Kitchen losartan (COZAAR) 25 MG tablet Take 1 tablet (25 mg total) by mouth daily.  30 tablet  5  . Milk Thistle 250 MG CAPS Take by mouth. 1-2 tablets daily       . Multiple Vitamins-Minerals (CENTRUM PO) Take 1 tablet by mouth daily.        . Probiotic Product (ALIGN PO) Take by mouth daily.         No current facility-administered medications on file prior to visit.    Allergies  Allergen Reactions  . Ace Inhibitors Cough    Review of Systems  Review of Systems  Constitutional: Negative for fever and malaise/fatigue.  HENT: Negative for congestion.   Eyes: Negative for discharge.  Respiratory: Negative for shortness of breath.   Cardiovascular: Negative for chest  pain, palpitations and leg swelling.  Gastrointestinal: Negative for nausea, abdominal pain and diarrhea.  Genitourinary: Negative for dysuria.  Musculoskeletal: Negative for falls.  Skin: Negative for rash.  Neurological: Negative for loss of consciousness and headaches.  Endo/Heme/Allergies: Negative for polydipsia.  Psychiatric/Behavioral: Negative for depression and suicidal ideas. The patient is not nervous/anxious and does not have insomnia.     Objective  BP 110/78  Pulse 54  Temp(Src) 97.7 F (36.5 C) (Oral)  Ht 5' 4.75" (1.645 m)  Wt 174 lb 0.6 oz (78.944 kg)  BMI 29.17 kg/m2  SpO2 96%  Physical Exam  Physical Exam  Constitutional: She is oriented to person, place, and time and well-developed, well-nourished, and in no distress. No distress.  HENT:  Head: Normocephalic and atraumatic.  Right Ear: External ear normal.  Left Ear: External ear normal.  Nose: Nose normal.  Mouth/Throat: Oropharynx is clear and moist. No oropharyngeal exudate.  Eyes: Conjunctivae are normal. Pupils are equal, round, and reactive to light. Right eye exhibits no discharge. Left eye exhibits no discharge. No scleral icterus.  Neck: Normal range of motion. Neck supple. No thyromegaly present.  Cardiovascular: Normal rate, regular rhythm, normal heart sounds and intact distal pulses.   No murmur heard. Pulmonary/Chest: Effort normal and breath sounds normal. No respiratory distress. She has no wheezes. She has no rales.  Abdominal: Soft. Bowel sounds are normal. She exhibits no distension and no mass. There is no tenderness.  Genitourinary: Vagina normal, uterus normal, cervix normal, right adnexa normal and left adnexa normal. No vaginal discharge found.  Musculoskeletal: Normal range of motion. She exhibits no edema and no tenderness.  Lymphadenopathy:    She has no cervical adenopathy.  Neurological: She is alert and oriented to person, place, and time. She has normal reflexes. No cranial  nerve deficit. Coordination normal.  Skin: Skin is warm and dry. No rash noted. She is not diaphoretic.  Psychiatric: Mood, memory and affect normal.    Lab Results  Component Value Date   TSH 1.14 06/12/2013   Lab Results  Component Value Date   WBC 6.1 06/12/2013   HGB 14.5 06/12/2013   HCT 43.3 06/12/2013   MCV 90.0 06/12/2013   PLT 173.0 06/12/2013   Lab Results  Component Value Date   CREATININE 0.8 06/12/2013   BUN 23  06/12/2013   NA 139 06/12/2013   K 3.3* 06/12/2013   CL 99 06/12/2013   CO2 31 06/12/2013   Lab Results  Component Value Date   ALT 26 06/12/2013   AST 24 06/12/2013   ALKPHOS 65 06/12/2013   BILITOT 0.9 06/12/2013   Lab Results  Component Value Date   CHOL 217* 06/12/2013   Lab Results  Component Value Date   HDL 49.40 06/12/2013   Lab Results  Component Value Date   LDLCALC 150* 06/12/2013   Lab Results  Component Value Date   TRIG 86.0 06/12/2013   Lab Results  Component Value Date   CHOLHDL 4 06/12/2013     Assessment & Plan  HTN (hypertension) Well controlled, no changes to meds. Encouraged heart healthy diet such as the DASH diet and exercise as tolerated.   Overweight Encouraged DASH diet, decrease po intake and increase exercise as tolerated. Needs 7-8 hours of sleep nightly. Avoid trans fats, eat small, frequent meals every 4-5 hours with lean proteins, complex carbs and healthy fats. Minimize simple carbs, GMO foods.  Medicare annual wellness visit, subsequent Given Prevnar shot today. Patient encouraged to maintain heart healthy diet, regular exercise, adequate sleep. Consider daily probiotics. Take medications as prescribed. Patient denies any difficulties at home. No trouble with ADLs, depression or falls. No recent changes to vision or hearing. Is UTD with immunizations. Is UTD with screening. Discussed Advanced Directives, patient agrees to bring [place] copies of documents if can.   Hyperlipidemia Tolerating statin, encouraged heart healthy diet, avoid trans  fats, minimize simple carbs and saturated fats. Increase exercise as tolerated  Cervical cancer screening Pap normal today  Hypokalemia Will switch from OTC potassium to prescription KCL and monitor dosing.

## 2013-06-19 NOTE — Progress Notes (Signed)
Pre visit review using our clinic review tool, if applicable. No additional management support is needed unless otherwise documented below in the visit note. 

## 2013-06-25 ENCOUNTER — Encounter: Payer: Self-pay | Admitting: Family Medicine

## 2013-06-25 NOTE — Assessment & Plan Note (Signed)
Tolerating statin, encouraged heart healthy diet, avoid trans fats, minimize simple carbs and saturated fats. Increase exercise as tolerated 

## 2013-06-25 NOTE — Assessment & Plan Note (Signed)
Pap normal today

## 2013-06-25 NOTE — Assessment & Plan Note (Signed)
Given Prevnar shot today. Patient encouraged to maintain heart healthy diet, regular exercise, adequate sleep. Consider daily probiotics. Take medications as prescribed. Patient denies any difficulties at home. No trouble with ADLs, depression or falls. No recent changes to vision or hearing. Is UTD with immunizations. Is UTD with screening. Discussed Advanced Directives, patient agrees to bring [place] copies of documents if can.

## 2013-06-25 NOTE — Assessment & Plan Note (Signed)
Encouraged DASH diet, decrease po intake and increase exercise as tolerated. Needs 7-8 hours of sleep nightly. Avoid trans fats, eat small, frequent meals every 4-5 hours with lean proteins, complex carbs and healthy fats. Minimize simple carbs, GMO foods. 

## 2013-06-25 NOTE — Assessment & Plan Note (Signed)
Well controlled, no changes to meds. Encouraged heart healthy diet such as the DASH diet and exercise as tolerated.  °

## 2013-06-25 NOTE — Assessment & Plan Note (Signed)
Will switch from OTC potassium to prescription KCL and monitor dosing.

## 2013-07-17 DIAGNOSIS — C6930 Malignant neoplasm of unspecified choroid: Secondary | ICD-10-CM | POA: Diagnosis not present

## 2013-07-24 ENCOUNTER — Telehealth: Payer: Self-pay

## 2013-07-24 DIAGNOSIS — E876 Hypokalemia: Secondary | ICD-10-CM

## 2013-07-24 NOTE — Telephone Encounter (Signed)
Message copied by Varney Daily on Mon Jul 24, 2013  3:21 PM ------      Message from: Laren Everts      Created: Mon Jul 24, 2013  2:59 PM       Hey!      This patient called in to schedule a lab appointment for tomorrow, She said she needs to have her potassium checked. Is that correct? ------

## 2013-07-24 NOTE — Telephone Encounter (Signed)
Please advise 

## 2013-07-24 NOTE — Telephone Encounter (Signed)
Yes for Hypokalemia

## 2013-07-25 ENCOUNTER — Other Ambulatory Visit (INDEPENDENT_AMBULATORY_CARE_PROVIDER_SITE_OTHER): Payer: Medicare Other

## 2013-07-25 DIAGNOSIS — E876 Hypokalemia: Secondary | ICD-10-CM | POA: Diagnosis not present

## 2013-07-25 LAB — RENAL FUNCTION PANEL
ALBUMIN: 3.7 g/dL (ref 3.5–5.2)
BUN: 18 mg/dL (ref 6–23)
CHLORIDE: 100 meq/L (ref 96–112)
CO2: 30 meq/L (ref 19–32)
Calcium: 9 mg/dL (ref 8.4–10.5)
Creatinine, Ser: 0.7 mg/dL (ref 0.4–1.2)
GFR: 81.82 mL/min (ref >60.00)
Glucose, Bld: 95 mg/dL (ref 70–99)
POTASSIUM: 3.4 meq/L — AB (ref 3.5–5.1)
Phosphorus: 3.7 mg/dL (ref 2.3–4.6)
SODIUM: 138 meq/L (ref 135–145)

## 2013-07-25 NOTE — Telephone Encounter (Signed)
Lab order placed.

## 2013-07-27 ENCOUNTER — Encounter: Payer: Self-pay | Admitting: Family Medicine

## 2013-07-27 DIAGNOSIS — E876 Hypokalemia: Secondary | ICD-10-CM

## 2013-07-27 MED ORDER — POTASSIUM CHLORIDE CRYS ER 20 MEQ PO TBCR: 20.0000 meq | EXTENDED_RELEASE_TABLET | Freq: Every day | ORAL | Status: DC

## 2013-08-25 ENCOUNTER — Other Ambulatory Visit: Payer: Self-pay | Admitting: Family Medicine

## 2013-08-25 DIAGNOSIS — E876 Hypokalemia: Secondary | ICD-10-CM

## 2013-08-29 ENCOUNTER — Other Ambulatory Visit: Payer: Medicare Other

## 2013-08-29 DIAGNOSIS — E876 Hypokalemia: Secondary | ICD-10-CM

## 2013-08-29 LAB — RENAL FUNCTION PANEL
ALBUMIN: 3.9 g/dL (ref 3.5–5.2)
BUN: 16 mg/dL (ref 6–23)
CALCIUM: 9.3 mg/dL (ref 8.4–10.5)
CHLORIDE: 97 meq/L (ref 96–112)
CO2: 29 meq/L (ref 19–32)
Creatinine, Ser: 0.8 mg/dL (ref 0.4–1.2)
GFR: 71.65 mL/min (ref >60.00)
GLUCOSE: 81 mg/dL (ref 70–99)
POTASSIUM: 3.7 meq/L (ref 3.5–5.1)
Phosphorus: 3.7 mg/dL (ref 2.3–4.6)
SODIUM: 136 meq/L (ref 135–145)

## 2013-09-04 ENCOUNTER — Telehealth: Payer: Self-pay | Admitting: Family Medicine

## 2013-09-04 MED ORDER — CHLORTHALIDONE 25 MG PO TABS: 25.0000 mg | ORAL_TABLET | Freq: Every day | ORAL | Status: DC

## 2013-09-04 NOTE — Telephone Encounter (Signed)
Refill- chlorthalidone  [facility] [id number]

## 2013-09-12 DIAGNOSIS — H31019 Macula scars of posterior pole (postinflammatory) (post-traumatic), unspecified eye: Secondary | ICD-10-CM | POA: Diagnosis not present

## 2013-09-12 DIAGNOSIS — H2510 Age-related nuclear cataract, unspecified eye: Secondary | ICD-10-CM | POA: Diagnosis not present

## 2013-09-12 DIAGNOSIS — H43399 Other vitreous opacities, unspecified eye: Secondary | ICD-10-CM | POA: Diagnosis not present

## 2013-09-12 DIAGNOSIS — H4011X0 Primary open-angle glaucoma, stage unspecified: Secondary | ICD-10-CM | POA: Diagnosis not present

## 2013-10-25 ENCOUNTER — Other Ambulatory Visit: Payer: Self-pay | Admitting: Family Medicine

## 2013-10-31 ENCOUNTER — Telehealth: Payer: Self-pay | Admitting: Family Medicine

## 2013-10-31 MED ORDER — LOSARTAN POTASSIUM 25 MG PO TABS: 25.0000 mg | ORAL_TABLET | Freq: Every day | ORAL | Status: DC

## 2013-10-31 NOTE — Telephone Encounter (Signed)
LOSARTAN POTASSIUM 25 MG TAB QTY 30

## 2013-11-03 ENCOUNTER — Encounter: Payer: Self-pay | Admitting: Family Medicine

## 2013-11-03 ENCOUNTER — Ambulatory Visit (INDEPENDENT_AMBULATORY_CARE_PROVIDER_SITE_OTHER): Payer: Medicare Other | Admitting: Family Medicine

## 2013-11-03 VITALS — BP 112/70 | HR 89 | Temp 98.5°F | Wt 174.0 lb

## 2013-11-03 DIAGNOSIS — J209 Acute bronchitis, unspecified: Secondary | ICD-10-CM

## 2013-11-03 DIAGNOSIS — I10 Essential (primary) hypertension: Secondary | ICD-10-CM

## 2013-11-03 DIAGNOSIS — E876 Hypokalemia: Secondary | ICD-10-CM

## 2013-11-03 DIAGNOSIS — E663 Overweight: Secondary | ICD-10-CM | POA: Diagnosis not present

## 2013-11-03 MED ORDER — CEFDINIR 300 MG PO CAPS: 300.0000 mg | ORAL_CAPSULE | Freq: Two times a day (BID) | ORAL | Status: AC

## 2013-11-03 MED ORDER — ALBUTEROL SULFATE HFA 108 (90 BASE) MCG/ACT IN AERS: 2.0000 | INHALATION_SPRAY | Freq: Four times a day (QID) | RESPIRATORY_TRACT | Status: DC | PRN

## 2013-11-03 MED ORDER — HYDROCODONE-HOMATROPINE 5-1.5 MG/5ML PO SYRP: 5.0000 mL | ORAL_SOLUTION | Freq: Three times a day (TID) | ORAL | Status: DC | PRN

## 2013-11-03 NOTE — Patient Instructions (Signed)

## 2013-11-06 ENCOUNTER — Encounter: Payer: Self-pay | Admitting: Family Medicine

## 2013-11-06 DIAGNOSIS — J209 Acute bronchitis, unspecified: Secondary | ICD-10-CM | POA: Insufficient documentation

## 2013-11-06 NOTE — Assessment & Plan Note (Signed)
Encouraged DASH diet, decrease po intake and increase exercise as tolerated. Needs 7-8 hours of sleep nightly. Avoid trans fats, eat small, frequent meals every 4-5 hours with lean proteins, complex carbs and healthy fats. Minimize simple carbs, GMO foods. 

## 2013-11-06 NOTE — Assessment & Plan Note (Signed)
Well controlled, no changes to meds. Encouraged heart healthy diet such as the DASH diet and exercise as tolerated.  °

## 2013-11-06 NOTE — Progress Notes (Signed)
Patient ID: Karen Hoffman, female   DOB: 08-Oct-1940, 73 y.o.   MRN: [id number] Karen Hoffman [id number] 08-Jan-1941 11/06/2013      Progress Note-Follow Up  Subjective  Chief Complaint  No chief complaint on file.   HPI  Patient is a 73 year old female in today for routine medical care. In today c/o several days worth of congestion with fatigue, chills, malaise, cough, anorexia, cough productive of yellow phlegm.only minor head congestion in head and itching in ears noted. No ear pain or sore throat. No n/v/d. Has tried Mucinex DM with minimal relief. Has trouble sleeping due to cough. Taking meds as prescribed  Past Medical History  Diagnosis Date  . Malignant melanoma of eye     left   . Cataracts, bilateral   . Glaucoma   . Elevated BP   . Osteoporosis   . Carcinoma in situ of skin of back   . Allergy     improved since move to [place]  . Asthma     mild, intermittent, improved since moving to [place]  . Overweight(278.02) 07/02/2010  . Thyroid disease 07/02/2010  . History of chicken pox 07/02/2010  . History of measles 07/02/2010  . Elevated LFTs 07/02/2010  . HTN (hypertension) 08/06/2010  . Hyperlipidemia 09/04/2010  . URI (upper respiratory infection) 11/17/2011  . Hypokalemia 11/18/2011  . Cervical cancer screening 06/19/2013  . Medicare annual wellness visit, subsequent 04/21/2011    Past Surgical History  Procedure Laterality Date  . Broken ankle  1997    right, screws and plates in place  . Tonsillectomy    . Left eye surgery    . Trunk skin lesion excisional biopsy      x twice    Family History  Problem Relation Age of Onset  . Cancer Mother     lung/ smoker  . Heart attack Paternal Grandmother   . Heart disease Father   . Hypertension Father   . Kidney disease Father 75    dialysis  . Obesity Father   . Diabetes type II Father   . Graves' disease Daughter   . Hypothyroidism Daughter     h/o Hashimoto's now hypothyroid  . Cholecystitis Son 23   cholecystectomy    History   Social History  . Marital Status: Widowed    Spouse Name: N/A    Number of Children: N/A  . Years of Education: N/A   Occupational History  . Not on file.   Social History Main Topics  . Smoking status: Former Smoker -- 0.20 packs/day for 10 years    Types: Cigarettes    Quit date: 04/07/1975  . Smokeless tobacco: Never Used  . Alcohol Use: Yes     Comment: 1 glass of wine or coctail with dinner  . Drug Use: No  . Sexual Activity: No   Other Topics Concern  . Not on file   Social History Narrative  . No narrative on file    Current Outpatient Prescriptions on File Prior to Visit  Medication Sig Dispense Refill  . Ascorbic Acid (VITAMIN C) 500 MG tablet Take 500 mg by mouth daily.        . Calcium Citrate-Vitamin D (CALCIUM CITRATE + PO) Take by mouth daily.      . chlorthalidone (HYGROTON) 25 MG tablet Take 1 tablet (25 mg total) by mouth daily.  30 tablet  2  . dorzolamide-timolol (COSOPT) 22.3-6.8 MG/ML ophthalmic solution       . Flaxseed,  Linseed, (FLAXSEED OIL) 1000 MG CAPS Take 1 capsule by mouth daily.        Marland Kitchen KLOR-CON M20 20 MEQ tablet TAKE 1 TABLET BY MOUTH EVERY DAY  30 tablet  2  . latanoprost (XALATAN) 0.005 % ophthalmic solution Place 1 drop into both eyes at bedtime.        Marland Kitchen losartan (COZAAR) 25 MG tablet Take 1 tablet (25 mg total) by mouth daily.  30 tablet  5  . Milk Thistle 250 MG CAPS Take by mouth. 1-2 tablets daily       . Multiple Vitamins-Minerals (CENTRUM PO) Take 1 tablet by mouth daily.        . potassium chloride (K-DUR) 10 MEQ tablet Take 1 tablet (10 mEq total) by mouth daily.  30 tablet  3  . Probiotic Product (ALIGN PO) Take by mouth daily.         No current facility-administered medications on file prior to visit.    Allergies  Allergen Reactions  . Ace Inhibitors Cough    Review of Systems  Review of Systems  Constitutional: Positive for chills and malaise/fatigue. Negative for fever.  HENT:  Positive for congestion.   Eyes: Negative for discharge.  Respiratory: Positive for cough, sputum production and shortness of breath.   Cardiovascular: Negative for chest pain, palpitations and leg swelling.  Gastrointestinal: Negative for nausea, abdominal pain and diarrhea.  Genitourinary: Negative for dysuria.  Musculoskeletal: Negative for falls.  Skin: Negative for rash.  Neurological: Negative for loss of consciousness and headaches.  Endo/Heme/Allergies: Negative for polydipsia.  Psychiatric/Behavioral: Negative for depression and suicidal ideas. The patient is not nervous/anxious and does not have insomnia.     Objective  BP 112/70  Pulse 89  Temp(Src) 98.5 F (36.9 C) (Oral)  Wt 174 lb (78.926 kg)  SpO2 92%  Physical Exam  Physical Exam  Constitutional: She is oriented to person, place, and time and well-developed, well-nourished, and in no distress. No distress.  HENT:  Head: Normocephalic and atraumatic.  Eyes: Conjunctivae are normal.  Neck: Neck supple. No thyromegaly present.  Cardiovascular: Normal rate, regular rhythm and normal heart sounds.   No murmur heard. Pulmonary/Chest: Effort normal. She has wheezes.  LLL  Abdominal: She exhibits no distension and no mass.  Musculoskeletal: She exhibits no edema.  Lymphadenopathy:    She has no cervical adenopathy.  Neurological: She is alert and oriented to person, place, and time.  Skin: Skin is warm and dry. No rash noted. She is not diaphoretic.  Psychiatric: Memory, affect and judgment normal.    Lab Results  Component Value Date   TSH 1.14 06/12/2013   Lab Results  Component Value Date   WBC 6.1 06/12/2013   HGB 14.5 06/12/2013   HCT 43.3 06/12/2013   MCV 90.0 06/12/2013   PLT 173.0 06/12/2013   Lab Results  Component Value Date   CREATININE 0.8 08/29/2013   BUN 16 08/29/2013   NA 136 08/29/2013   K 3.7 08/29/2013   CL 97 08/29/2013   CO2 29 08/29/2013   Lab Results  Component Value Date   ALT 26 06/12/2013    AST 24 06/12/2013   ALKPHOS 65 06/12/2013   BILITOT 0.9 06/12/2013   Lab Results  Component Value Date   CHOL 217* 06/12/2013   Lab Results  Component Value Date   HDL 49.40 06/12/2013   Lab Results  Component Value Date   LDLCALC 150* 06/12/2013   Lab Results  Component Value Date  TRIG 86.0 06/12/2013   Lab Results  Component Value Date   CHOLHDL 4 06/12/2013     Assessment & Plan  HTN (hypertension) Well controlled, no changes to meds. Encouraged heart healthy diet such as the DASH diet and exercise as tolerated.   Overweight Encouraged DASH diet, decrease po intake and increase exercise as tolerated. Needs 7-8 hours of sleep nightly. Avoid trans fats, eat small, frequent meals every 4-5 hours with lean proteins, complex carbs and healthy fats. Minimize simple carbs, GMO foods.  Acute bronchitis Encouraged increased rest and hydration, add probiotics, zinc such as Coldeze or Xicam. Treat fevers as needed. Treated with Omnicef and Hycodan.  Hypokalemia Resolved on recheck

## 2013-11-06 NOTE — Assessment & Plan Note (Signed)
Resolved on recheck 

## 2013-11-06 NOTE — Assessment & Plan Note (Signed)
Encouraged increased rest and hydration, add probiotics, zinc such as Coldeze or Xicam. Treat fevers as needed. Treated with Omnicef and Hycodan.

## 2013-11-29 ENCOUNTER — Other Ambulatory Visit: Payer: Self-pay | Admitting: Family Medicine

## 2013-12-07 ENCOUNTER — Other Ambulatory Visit: Payer: Self-pay | Admitting: Family Medicine

## 2013-12-07 ENCOUNTER — Other Ambulatory Visit (INDEPENDENT_AMBULATORY_CARE_PROVIDER_SITE_OTHER): Payer: Medicare Other

## 2013-12-07 DIAGNOSIS — I10 Essential (primary) hypertension: Secondary | ICD-10-CM

## 2013-12-07 LAB — RENAL FUNCTION PANEL
ALBUMIN: 3.9 g/dL (ref 3.5–5.2)
BUN: 19 mg/dL (ref 6–23)
CALCIUM: 9.4 mg/dL (ref 8.4–10.5)
CHLORIDE: 99 meq/L (ref 96–112)
CO2: 30 meq/L (ref 19–32)
Creatinine, Ser: 0.8 mg/dL (ref 0.4–1.2)
GFR: 72.6 mL/min (ref >60.00)
Glucose, Bld: 95 mg/dL (ref 70–99)
POTASSIUM: 4 meq/L (ref 3.5–5.1)
Phosphorus: 4.1 mg/dL (ref 2.3–4.6)
Sodium: 137 mEq/L (ref 135–145)

## 2013-12-14 ENCOUNTER — Encounter: Payer: Self-pay | Admitting: Family Medicine

## 2013-12-14 ENCOUNTER — Ambulatory Visit (INDEPENDENT_AMBULATORY_CARE_PROVIDER_SITE_OTHER): Payer: Medicare Other | Admitting: Family Medicine

## 2013-12-14 VITALS — BP 124/79 | HR 62 | Temp 97.7°F | Ht 64.75 in | Wt 177.8 lb

## 2013-12-14 DIAGNOSIS — E663 Overweight: Secondary | ICD-10-CM

## 2013-12-14 DIAGNOSIS — Z5189 Encounter for other specified aftercare: Secondary | ICD-10-CM

## 2013-12-14 DIAGNOSIS — I10 Essential (primary) hypertension: Secondary | ICD-10-CM

## 2013-12-14 DIAGNOSIS — Z23 Encounter for immunization: Secondary | ICD-10-CM | POA: Diagnosis not present

## 2013-12-14 DIAGNOSIS — E785 Hyperlipidemia, unspecified: Secondary | ICD-10-CM | POA: Diagnosis not present

## 2013-12-14 DIAGNOSIS — T7840XD Allergy, unspecified, subsequent encounter: Secondary | ICD-10-CM

## 2013-12-14 DIAGNOSIS — E876 Hypokalemia: Secondary | ICD-10-CM

## 2013-12-14 NOTE — Progress Notes (Signed)
Pre visit review using our clinic review tool, if applicable. No additional management support is needed unless otherwise documented below in the visit note. 

## 2013-12-14 NOTE — Patient Instructions (Addendum)
Labs at [facility] Hypertension Hypertension, commonly called high blood pressure, is when the force of blood pumping through your arteries is too strong. Your arteries are the blood vessels that carry blood from your heart throughout your body. A blood pressure reading consists of a higher number over a lower number, such as 110/72. The higher number (systolic) is the pressure inside your arteries when your heart pumps. The lower number (diastolic) is the pressure inside your arteries when your heart relaxes. Ideally you want your blood pressure below 120/80. Hypertension forces your heart to work harder to pump blood. Your arteries may become narrow or stiff. Having hypertension puts you at risk for heart disease, stroke, and other problems.  RISK FACTORS Some risk factors for high blood pressure are controllable. Others are not.  Risk factors you cannot control include:   Race. You may be at higher risk if you are African American.  Age. Risk increases with age.  Gender. Men are at higher risk than women before age 60 years. After age 39, women are at higher risk than men. Risk factors you can control include:  Not getting enough exercise or physical activity.  Being overweight.  Getting too much fat, sugar, calories, or salt in your diet.  Drinking too much alcohol. SIGNS AND SYMPTOMS Hypertension does not usually cause signs or symptoms. Extremely high blood pressure (hypertensive crisis) may cause headache, anxiety, shortness of breath, and nosebleed. DIAGNOSIS  To check if you have hypertension, your health care provider will measure your blood pressure while you are seated, with your arm held at the level of your heart. It should be measured at least twice using the same arm. Certain conditions can cause a difference in blood pressure between your right and left arms. A blood pressure reading that is higher than normal on one occasion does not mean that you need treatment. If one  blood pressure reading is high, ask your health care provider about having it checked again. TREATMENT  Treating high blood pressure includes making lifestyle changes and possibly taking medicine. Living a healthy lifestyle can help lower high blood pressure. You may need to change some of your habits. Lifestyle changes may include:  Following the DASH diet. This diet is high in fruits, vegetables, and whole grains. It is low in salt, red meat, and added sugars.  Getting at least 2 hours of brisk physical activity every week.  Losing weight if necessary.  Not smoking.  Limiting alcoholic beverages.  Learning ways to reduce stress. If lifestyle changes are not enough to get your blood pressure under control, your health care provider may prescribe medicine. You may need to take more than one. Work closely with your health care provider to understand the risks and benefits. HOME CARE INSTRUCTIONS  Have your blood pressure rechecked as directed by your health care provider.   Take medicines only as directed by your health care provider. Follow the directions carefully. Blood pressure medicines must be taken as prescribed. The medicine does not work as well when you skip doses. Skipping doses also puts you at risk for problems.   Do not smoke.   Monitor your blood pressure at home as directed by your health care provider. SEEK MEDICAL CARE IF:   You think you are having a reaction to medicines taken.  You have recurrent headaches or feel dizzy.  You have swelling in your ankles.  You have trouble with your vision. SEEK IMMEDIATE MEDICAL CARE IF:  You develop a  severe headache or confusion.  You have unusual weakness, numbness, or feel faint.  You have severe chest or abdominal pain.  You vomit repeatedly.  You have trouble breathing. MAKE SURE YOU:   Understand these instructions.  Will watch your condition.  Will get help right away if you are not doing well or  get worse. Document Released: 03/23/2005 Document Revised: 08/07/2013 Document Reviewed: 01/13/2013 [facility] Patient Information 2015 [place], [place]. This information is not intended to replace advice given to you by your health care provider. Make sure you discuss any questions you have with your health care provider.

## 2013-12-17 ENCOUNTER — Encounter: Payer: Self-pay | Admitting: Family Medicine

## 2013-12-17 NOTE — Assessment & Plan Note (Signed)
resolved 

## 2013-12-17 NOTE — Progress Notes (Signed)
Patient ID: Karen Hoffman, female   DOB: Apr 24, 1940, 73 y.o.   MRN: [id number] Karen Hoffman [id number] 12-09-1940 12/17/2013      Progress Note-Follow Up  Subjective  Chief Complaint  Chief Complaint  Patient presents with  . Follow-up    6 month  . Injections    flu    HPI  Patient is a 73 year old female in today for routine medical care. Is generally doing well. No recent illness. Her respiratory symptoms have resolved. Denies any palpitations. Agrees to flu shot today. Denies CP/palp/SOB/HA/congestion/fevers/GI or GU c/o. Taking meds as prescribed  Past Medical History  Diagnosis Date  . Malignant melanoma of eye     left   . Cataracts, bilateral   . Glaucoma   . Elevated BP   . Osteoporosis   . Carcinoma in situ of skin of back   . Allergy     improved since move to [place]  . Asthma     mild, intermittent, improved since moving to [place]  . Overweight(278.02) 07/02/2010  . Thyroid disease 07/02/2010  . History of chicken pox 07/02/2010  . History of measles 07/02/2010  . Elevated LFTs 07/02/2010  . HTN (hypertension) 08/06/2010  . Hyperlipidemia 09/04/2010  . URI (upper respiratory infection) 11/17/2011  . Hypokalemia 11/18/2011  . Cervical cancer screening 06/19/2013  . Medicare annual wellness visit, subsequent 04/21/2011    Past Surgical History  Procedure Laterality Date  . Broken ankle  1997    right, screws and plates in place  . Tonsillectomy    . Left eye surgery    . Trunk skin lesion excisional biopsy      x twice    Family History  Problem Relation Age of Onset  . Cancer Mother     lung/ smoker  . Heart attack Paternal Grandmother   . Heart disease Father   . Hypertension Father   . Kidney disease Father 56    dialysis  . Obesity Father   . Diabetes type II Father   . Graves' disease Daughter   . Hypothyroidism Daughter     h/o Hashimoto's now hypothyroid  . Cholecystitis Son 10    cholecystectomy    History   Social History  . Marital  Status: Widowed    Spouse Name: N/A    Number of Children: N/A  . Years of Education: N/A   Occupational History  . Not on file.   Social History Main Topics  . Smoking status: Former Smoker -- 0.20 packs/day for 10 years    Types: Cigarettes    Quit date: 04/07/1975  . Smokeless tobacco: Never Used  . Alcohol Use: Yes     Comment: 1 glass of wine or coctail with dinner  . Drug Use: No  . Sexual Activity: No   Other Topics Concern  . Not on file   Social History Narrative  . No narrative on file    Current Outpatient Prescriptions on File Prior to Visit  Medication Sig Dispense Refill  . albuterol (PROVENTIL HFA;VENTOLIN HFA) 108 (90 BASE) MCG/ACT inhaler Inhale 2 puffs into the lungs every 6 (six) hours as needed for wheezing or shortness of breath.  1 Inhaler  1  . Ascorbic Acid (VITAMIN C) 500 MG tablet Take 500 mg by mouth daily.        . Calcium Citrate-Vitamin D (CALCIUM CITRATE + PO) Take by mouth daily.      . chlorthalidone (HYGROTON) 25 MG tablet TAKE 1  TABLET BY MOUTH EVERY DAY  30 tablet  2  . dorzolamide-timolol (COSOPT) 22.3-6.8 MG/ML ophthalmic solution       . Flaxseed, Linseed, (FLAXSEED OIL) 1000 MG CAPS Take 1 capsule by mouth daily.        Marland Kitchen KLOR-CON M20 20 MEQ tablet TAKE 1 TABLET BY MOUTH EVERY DAY  30 tablet  2  . latanoprost (XALATAN) 0.005 % ophthalmic solution Place 1 drop into both eyes at bedtime.        Marland Kitchen losartan (COZAAR) 25 MG tablet Take 1 tablet (25 mg total) by mouth daily.  30 tablet  5  . Milk Thistle 250 MG CAPS Take by mouth. 1-2 tablets daily       . Multiple Vitamins-Minerals (CENTRUM PO) Take 1 tablet by mouth daily.        . potassium chloride (K-DUR) 10 MEQ tablet Take 1 tablet (10 mEq total) by mouth daily.  30 tablet  3  . Probiotic Product (ALIGN PO) Take by mouth daily.         No current facility-administered medications on file prior to visit.    Allergies  Allergen Reactions  . Ace Inhibitors Cough    Review of  Systems  Review of Systems  Constitutional: Negative for fever and malaise/fatigue.  HENT: Negative for congestion.   Eyes: Negative for discharge.  Respiratory: Negative for shortness of breath.   Cardiovascular: Negative for chest pain, palpitations and leg swelling.  Gastrointestinal: Negative for nausea, abdominal pain and diarrhea.  Genitourinary: Negative for dysuria.  Musculoskeletal: Negative for falls.  Skin: Negative for rash.  Neurological: Negative for loss of consciousness and headaches.  Endo/Heme/Allergies: Negative for polydipsia.  Psychiatric/Behavioral: Negative for depression and suicidal ideas. The patient is not nervous/anxious and does not have insomnia.     Objective  BP 124/79  Pulse 62  Temp(Src) 97.7 F (36.5 C) (Oral)  Ht 5' 4.75" (1.645 m)  Wt 177 lb 12.8 oz (80.65 kg)  BMI 29.80 kg/m2  SpO2 100%  Physical Exam  Physical Exam  Constitutional: She is oriented to person, place, and time and well-developed, well-nourished, and in no distress. No distress.  HENT:  Head: Normocephalic and atraumatic.  Eyes: Conjunctivae are normal.  Neck: Neck supple. No thyromegaly present.  Cardiovascular: Normal rate, regular rhythm and normal heart sounds.   No murmur heard. Pulmonary/Chest: Effort normal and breath sounds normal. She has no wheezes.  Abdominal: She exhibits no distension and no mass.  Musculoskeletal: She exhibits no edema.  Lymphadenopathy:    She has no cervical adenopathy.  Neurological: She is alert and oriented to person, place, and time.  Skin: Skin is warm and dry. No rash noted. She is not diaphoretic.  Psychiatric: Memory, affect and judgment normal.    Lab Results  Component Value Date   TSH 1.14 06/12/2013   Lab Results  Component Value Date   WBC 6.1 06/12/2013   HGB 14.5 06/12/2013   HCT 43.3 06/12/2013   MCV 90.0 06/12/2013   PLT 173.0 06/12/2013   Lab Results  Component Value Date   CREATININE 0.8 12/07/2013   BUN 19 12/07/2013    NA 137 12/07/2013   K 4.0 12/07/2013   CL 99 12/07/2013   CO2 30 12/07/2013   Lab Results  Component Value Date   ALT 26 06/12/2013   AST 24 06/12/2013   ALKPHOS 65 06/12/2013   BILITOT 0.9 06/12/2013   Lab Results  Component Value Date   CHOL 217* 06/12/2013  Lab Results  Component Value Date   HDL 49.40 06/12/2013   Lab Results  Component Value Date   LDLCALC 150* 06/12/2013   Lab Results  Component Value Date   TRIG 86.0 06/12/2013   Lab Results  Component Value Date   CHOLHDL 4 06/12/2013     Assessment & Plan  Overweight Encouraged DASH diet, decrease po intake and increase exercise as tolerated. Needs 7-8 hours of sleep nightly. Avoid trans fats, eat small, frequent meals every 4-5 hours with lean proteins, complex carbs and healthy fats. Minimize simple carbs  Hyperlipidemia Encouraged heart healthy diet, increase exercise, avoid trans fats, consider a krill oil cap daily  HTN (hypertension) Well controlled, no changes to meds. Encouraged heart healthy diet such as the DASH diet and exercise as tolerated.   Hypokalemia resolved  Allergic state Doing well

## 2013-12-17 NOTE — Assessment & Plan Note (Signed)
Doing well 

## 2013-12-17 NOTE — Assessment & Plan Note (Signed)
Encouraged DASH diet, decrease po intake and increase exercise as tolerated. Needs 7-8 hours of sleep nightly. Avoid trans fats, eat small, frequent meals every 4-5 hours with lean proteins, complex carbs and healthy fats. Minimize simple carbs 

## 2013-12-17 NOTE — Assessment & Plan Note (Signed)
Encouraged heart healthy diet, increase exercise, avoid trans fats, consider a krill oil cap daily 

## 2013-12-17 NOTE — Assessment & Plan Note (Signed)
Well controlled, no changes to meds. Encouraged heart healthy diet such as the DASH diet and exercise as tolerated.  °

## 2013-12-18 DIAGNOSIS — H2510 Age-related nuclear cataract, unspecified eye: Secondary | ICD-10-CM | POA: Diagnosis not present

## 2013-12-18 DIAGNOSIS — T66XXXS Radiation sickness, unspecified, sequela: Secondary | ICD-10-CM | POA: Diagnosis not present

## 2013-12-18 DIAGNOSIS — C6930 Malignant neoplasm of unspecified choroid: Secondary | ICD-10-CM | POA: Diagnosis not present

## 2013-12-18 DIAGNOSIS — Z85840 Personal history of malignant neoplasm of eye: Secondary | ICD-10-CM | POA: Diagnosis not present

## 2014-01-21 ENCOUNTER — Other Ambulatory Visit: Payer: Self-pay | Admitting: Family Medicine

## 2014-01-22 ENCOUNTER — Other Ambulatory Visit: Payer: Self-pay | Admitting: Family Medicine

## 2014-02-19 ENCOUNTER — Other Ambulatory Visit: Payer: Self-pay

## 2014-02-19 MED ORDER — CHLORTHALIDONE 25 MG PO TABS: 25.0000 mg | ORAL_TABLET | Freq: Every day | ORAL | Status: DC

## 2014-02-26 ENCOUNTER — Encounter: Payer: Self-pay | Admitting: Family Medicine

## 2014-02-26 ENCOUNTER — Telehealth: Payer: Self-pay | Admitting: Family Medicine

## 2014-02-26 MED ORDER — CHLORTHALIDONE 25 MG PO TABS: 25.0000 mg | ORAL_TABLET | Freq: Every day | ORAL | Status: DC

## 2014-02-26 NOTE — Telephone Encounter (Signed)
rx sent

## 2014-02-26 NOTE — Telephone Encounter (Signed)
This was already sent in today.

## 2014-02-26 NOTE — Telephone Encounter (Signed)
Caller name: illona  Relation to pt: self  Call back number: [phone] Pharmacy: [facility] on [address]  Reason for call:   Patient is requesting a refill of chlorthalidone (HYGROTON) 25 MG tablet [[id number]]

## 2014-03-07 DIAGNOSIS — H4011X1 Primary open-angle glaucoma, mild stage: Secondary | ICD-10-CM | POA: Diagnosis not present

## 2014-03-28 ENCOUNTER — Other Ambulatory Visit: Payer: Self-pay | Admitting: Family Medicine

## 2014-04-04 ENCOUNTER — Other Ambulatory Visit: Payer: Self-pay | Admitting: Family Medicine

## 2014-04-05 NOTE — Telephone Encounter (Signed)
Rx denied filled on (03/28/14).//AB/CMA

## 2014-04-23 ENCOUNTER — Other Ambulatory Visit: Payer: Self-pay | Admitting: Family Medicine

## 2014-04-24 ENCOUNTER — Other Ambulatory Visit: Payer: Self-pay | Admitting: Family Medicine

## 2014-05-15 DIAGNOSIS — L814 Other melanin hyperpigmentation: Secondary | ICD-10-CM | POA: Diagnosis not present

## 2014-05-15 DIAGNOSIS — L821 Other seborrheic keratosis: Secondary | ICD-10-CM | POA: Diagnosis not present

## 2014-05-15 DIAGNOSIS — L820 Inflamed seborrheic keratosis: Secondary | ICD-10-CM | POA: Diagnosis not present

## 2014-06-18 ENCOUNTER — Other Ambulatory Visit (INDEPENDENT_AMBULATORY_CARE_PROVIDER_SITE_OTHER): Payer: Medicare Other

## 2014-06-18 ENCOUNTER — Other Ambulatory Visit: Payer: Medicare Other

## 2014-06-18 DIAGNOSIS — E876 Hypokalemia: Secondary | ICD-10-CM | POA: Diagnosis not present

## 2014-06-18 DIAGNOSIS — E785 Hyperlipidemia, unspecified: Secondary | ICD-10-CM

## 2014-06-18 DIAGNOSIS — I10 Essential (primary) hypertension: Secondary | ICD-10-CM | POA: Diagnosis not present

## 2014-06-18 LAB — LIPID PANEL
CHOLESTEROL: 202 mg/dL — AB (ref 0–200)
HDL: 52.1 mg/dL (ref >39.00)
LDL CALC: 122 mg/dL — AB (ref 0–99)
NonHDL: 149.9
Total CHOL/HDL Ratio: 4
Triglycerides: 139 mg/dL (ref 0.0–149.0)
VLDL: 27.8 mg/dL (ref 0.0–40.0)

## 2014-06-18 LAB — CBC WITH DIFFERENTIAL/PLATELET
Basophils Absolute: 0.1 10*3/uL (ref 0.0–0.1)
Basophils Relative: 0.9 % (ref 0.0–3.0)
EOS ABS: 0.2 10*3/uL (ref 0.0–0.7)
Eosinophils Relative: 3 % (ref 0.0–5.0)
HCT: 44.3 % (ref 36.0–46.0)
HEMOGLOBIN: 15.1 g/dL — AB (ref 12.0–15.0)
Lymphocytes Relative: 19 % (ref 12.0–46.0)
Lymphs Abs: 1.3 10*3/uL (ref 0.7–4.0)
MCHC: 34.1 g/dL (ref 30.0–36.0)
MCV: 88.8 fl (ref 78.0–100.0)
MONO ABS: 0.5 10*3/uL (ref 0.1–1.0)
Monocytes Relative: 7.7 % (ref 3.0–12.0)
NEUTROS ABS: 4.8 10*3/uL (ref 1.4–7.7)
Neutrophils Relative %: 69.4 % (ref 43.0–77.0)
Platelets: 185 10*3/uL (ref 150.0–400.0)
RBC: 4.99 Mil/uL (ref 3.87–5.11)
RDW: 13.6 % (ref 11.5–15.5)
WBC: 6.9 10*3/uL (ref 4.0–10.5)

## 2014-06-18 LAB — RENAL FUNCTION PANEL
ALBUMIN: 4.1 g/dL (ref 3.5–5.2)
BUN: 17 mg/dL (ref 6–23)
CHLORIDE: 100 meq/L (ref 96–112)
CO2: 32 meq/L (ref 19–32)
Calcium: 9.2 mg/dL (ref 8.4–10.5)
Creatinine, Ser: 0.8 mg/dL (ref 0.40–1.20)
GFR: 74.59 mL/min (ref >60.00)
Glucose, Bld: 89 mg/dL (ref 70–99)
Phosphorus: 2.8 mg/dL (ref 2.3–4.6)
Potassium: 3.1 mEq/L — ABNORMAL LOW (ref 3.5–5.1)
Sodium: 138 mEq/L (ref 135–145)

## 2014-06-18 LAB — HEPATIC FUNCTION PANEL
ALK PHOS: 75 U/L (ref 39–117)
ALT: 20 U/L (ref 0–35)
AST: 18 U/L (ref 0–37)
Albumin: 4.1 g/dL (ref 3.5–5.2)
BILIRUBIN DIRECT: 0.2 mg/dL (ref 0.0–0.3)
Total Bilirubin: 0.6 mg/dL (ref 0.2–1.2)
Total Protein: 6.7 g/dL (ref 6.0–8.3)

## 2014-06-18 LAB — TSH: TSH: 1.81 u[IU]/mL (ref 0.35–4.50)

## 2014-06-19 ENCOUNTER — Other Ambulatory Visit: Payer: Self-pay | Admitting: Family Medicine

## 2014-06-22 ENCOUNTER — Telehealth: Payer: Self-pay

## 2014-06-22 NOTE — Telephone Encounter (Signed)
See speciality notes 

## 2014-06-25 ENCOUNTER — Ambulatory Visit (INDEPENDENT_AMBULATORY_CARE_PROVIDER_SITE_OTHER): Payer: Medicare Other | Admitting: Family Medicine

## 2014-06-25 ENCOUNTER — Encounter: Payer: Self-pay | Admitting: Family Medicine

## 2014-06-25 VITALS — BP 122/76 | HR 59 | Temp 98.0°F | Resp 16 | Ht 65.0 in | Wt 178.0 lb

## 2014-06-25 DIAGNOSIS — I10 Essential (primary) hypertension: Secondary | ICD-10-CM

## 2014-06-25 DIAGNOSIS — M810 Age-related osteoporosis without current pathological fracture: Secondary | ICD-10-CM | POA: Diagnosis not present

## 2014-06-25 DIAGNOSIS — E876 Hypokalemia: Secondary | ICD-10-CM | POA: Diagnosis not present

## 2014-06-25 DIAGNOSIS — Z0000 Encounter for general adult medical examination without abnormal findings: Secondary | ICD-10-CM | POA: Diagnosis not present

## 2014-06-25 DIAGNOSIS — E785 Hyperlipidemia, unspecified: Secondary | ICD-10-CM

## 2014-06-25 LAB — COMPREHENSIVE METABOLIC PANEL
ALT: 20 U/L (ref 0–35)
AST: 20 U/L (ref 0–37)
Albumin: 4.1 g/dL (ref 3.5–5.2)
Alkaline Phosphatase: 82 U/L (ref 39–117)
BILIRUBIN TOTAL: 0.7 mg/dL (ref 0.2–1.2)
BUN: 16 mg/dL (ref 6–23)
CALCIUM: 9.6 mg/dL (ref 8.4–10.5)
CHLORIDE: 100 meq/L (ref 96–112)
CO2: 32 meq/L (ref 19–32)
Creatinine, Ser: 0.81 mg/dL (ref 0.40–1.20)
GFR: 73.53 mL/min (ref >60.00)
Glucose, Bld: 93 mg/dL (ref 70–99)
Potassium: 3.9 mEq/L (ref 3.5–5.1)
Sodium: 138 mEq/L (ref 135–145)
Total Protein: 6.8 g/dL (ref 6.0–8.3)

## 2014-06-25 MED ORDER — KLOR-CON M20 20 MEQ PO TBCR: 20.0000 meq | EXTENDED_RELEASE_TABLET | Freq: Two times a day (BID) | ORAL | Status: DC

## 2014-06-25 NOTE — Progress Notes (Signed)
Pre visit review using our clinic review tool, if applicable. No additional management support is needed unless otherwise documented below in the visit note. 

## 2014-06-25 NOTE — Patient Instructions (Addendum)
Consider a Vitamin D 2000 IU daily Preventive Care for Adults A healthy lifestyle and preventive care can promote health and wellness. Preventive health guidelines for women include the following key practices.  A routine yearly physical is a good way to check with your health care provider about your health and preventive screening. It is a chance to share any concerns and updates on your health and to receive a thorough exam.  Visit your dentist for a routine exam and preventive care every 6 months. Brush your teeth twice a day and floss once a day. Good oral hygiene prevents tooth decay and gum disease.  The frequency of eye exams is based on your age, health, family medical history, use of contact lenses, and other factors. Follow your health care provider's recommendations for frequency of eye exams.  Eat a healthy diet. Foods like vegetables, fruits, whole grains, low-fat dairy products, and lean protein foods contain the nutrients you need without too many calories. Decrease your intake of foods high in solid fats, added sugars, and salt. Eat the right amount of calories for you.Get information about a proper diet from your health care provider, if necessary.  Regular physical exercise is one of the most important things you can do for your health. Most adults should get at least 150 minutes of moderate-intensity exercise (any activity that increases your heart rate and causes you to sweat) each week. In addition, most adults need muscle-strengthening exercises on 2 or more days a week.  Maintain a healthy weight. The body mass index (BMI) is a screening tool to identify possible weight problems. It provides an estimate of body fat based on height and weight. Your health care provider can find your BMI and can help you achieve or maintain a healthy weight.For adults 20 years and older:  A BMI below 18.5 is considered underweight.  A BMI of 18.5 to 24.9 is normal.  A BMI of 25 to 29.9 is  considered overweight.  A BMI of 30 and above is considered obese.  Maintain normal blood lipids and cholesterol levels by exercising and minimizing your intake of saturated fat. Eat a balanced diet with plenty of fruit and vegetables. Blood tests for lipids and cholesterol should begin at age 67 and be repeated every 5 years. If your lipid or cholesterol levels are high, you are over 50, or you are at high risk for heart disease, you may need your cholesterol levels checked more frequently.Ongoing high lipid and cholesterol levels should be treated with medicines if diet and exercise are not working.  If you smoke, find out from your health care provider how to quit. If you do not use tobacco, do not start.  Lung cancer screening is recommended for adults aged 36-80 years who are at high risk for developing lung cancer because of a history of smoking. A yearly low-dose CT scan of the lungs is recommended for people who have at least a 30-pack-year history of smoking and are a current smoker or have quit within the past 15 years. A pack year of smoking is smoking an average of 1 pack of cigarettes a day for 1 year (for example: 1 pack a day for 30 years or 2 packs a day for 15 years). Yearly screening should continue until the smoker has stopped smoking for at least 15 years. Yearly screening should be stopped for people who develop a health problem that would prevent them from having lung cancer treatment.  If you are pregnant, do  not drink alcohol. If you are breastfeeding, be very cautious about drinking alcohol. If you are not pregnant and choose to drink alcohol, do not have more than 1 drink per day. One drink is considered to be 12 ounces (355 mL) of beer, 5 ounces (148 mL) of wine, or 1.5 ounces (44 mL) of liquor.  Avoid use of street drugs. Do not share needles with anyone. Ask for help if you need support or instructions about stopping the use of drugs.  High blood pressure causes heart  disease and increases the risk of stroke. Your blood pressure should be checked at least every 1 to 2 years. Ongoing high blood pressure should be treated with medicines if weight loss and exercise do not work.  If you are 10-3 years old, ask your health care provider if you should take aspirin to prevent strokes.  Diabetes screening involves taking a blood sample to check your fasting blood sugar level. This should be done once every 3 years, after age 24, if you are within normal weight and without risk factors for diabetes. Testing should be considered at a younger age or be carried out more frequently if you are overweight and have at least 1 risk factor for diabetes.  Breast cancer screening is essential preventive care for women. You should practice "breast self-awareness." This means understanding the normal appearance and feel of your breasts and may include breast self-examination. Any changes detected, no matter how small, should be reported to a health care provider. Women in their 70s and 30s should have a clinical breast exam (CBE) by a health care provider as part of a regular health exam every 1 to 3 years. After age 42, women should have a CBE every year. Starting at age 16, women should consider having a mammogram (breast X-ray test) every year. Women who have a family history of breast cancer should talk to their health care provider about genetic screening. Women at a high risk of breast cancer should talk to their health care providers about having an MRI and a mammogram every year.  Breast cancer gene (BRCA)-related cancer risk assessment is recommended for women who have family members with BRCA-related cancers. BRCA-related cancers include breast, ovarian, tubal, and peritoneal cancers. Having family members with these cancers may be associated with an increased risk for harmful changes (mutations) in the breast cancer genes BRCA1 and BRCA2. Results of the assessment will determine  the need for genetic counseling and BRCA1 and BRCA2 testing.  Routine pelvic exams to screen for cancer are no longer recommended for nonpregnant women who are considered low risk for cancer of the pelvic organs (ovaries, uterus, and vagina) and who do not have symptoms. Ask your health care provider if a screening pelvic exam is right for you.  If you have had past treatment for cervical cancer or a condition that could lead to cancer, you need Pap tests and screening for cancer for at least 20 years after your treatment. If Pap tests have been discontinued, your risk factors (such as having a new sexual partner) need to be reassessed to determine if screening should be resumed. Some women have medical problems that increase the chance of getting cervical cancer. In these cases, your health care provider may recommend more frequent screening and Pap tests.  The HPV test is an additional test that may be used for cervical cancer screening. The HPV test looks for the virus that can cause the cell changes on the cervix. The cells  collected during the Pap test can be tested for HPV. The HPV test could be used to screen women aged 32 years and older, and should be used in women of any age who have unclear Pap test results. After the age of 54, women should have HPV testing at the same frequency as a Pap test.  Colorectal cancer can be detected and often prevented. Most routine colorectal cancer screening begins at the age of 80 years and continues through age 59 years. However, your health care provider may recommend screening at an earlier age if you have risk factors for colon cancer. On a yearly basis, your health care provider may provide home test kits to check for hidden blood in the stool. Use of a small camera at the end of a tube, to directly examine the colon (sigmoidoscopy or colonoscopy), can detect the earliest forms of colorectal cancer. Talk to your health care provider about this at age 45, when  routine screening begins. Direct exam of the colon should be repeated every 5-10 years through age 23 years, unless early forms of pre-cancerous polyps or small growths are found.  People who are at an increased risk for hepatitis B should be screened for this virus. You are considered at high risk for hepatitis B if:  You were born in a country where hepatitis B occurs often. Talk with your health care provider about which countries are considered high risk.  Your parents were born in a high-risk country and you have not received a shot to protect against hepatitis B (hepatitis B vaccine).  You have HIV or AIDS.  You use needles to inject street drugs.  You live with, or have sex with, someone who has hepatitis B.  You get hemodialysis treatment.  You take certain medicines for conditions like cancer, organ transplantation, and autoimmune conditions.  Hepatitis C blood testing is recommended for all people born from [birth year] through 1965 and any individual with known risks for hepatitis C.  Practice safe sex. Use condoms and avoid high-risk sexual practices to reduce the spread of sexually transmitted infections (STIs). STIs include gonorrhea, chlamydia, syphilis, trichomonas, herpes, HPV, and human immunodeficiency virus (HIV). Herpes, HIV, and HPV are viral illnesses that have no cure. They can result in disability, cancer, and death.  You should be screened for sexually transmitted illnesses (STIs) including gonorrhea and chlamydia if:  You are sexually active and are younger than 24 years.  You are older than 24 years and your health care provider tells you that you are at risk for this type of infection.  Your sexual activity has changed since you were last screened and you are at an increased risk for chlamydia or gonorrhea. Ask your health care provider if you are at risk.  If you are at risk of being infected with HIV, it is recommended that you take a prescription medicine daily  to prevent HIV infection. This is called preexposure prophylaxis (PrEP). You are considered at risk if:  You are a heterosexual woman, are sexually active, and are at increased risk for HIV infection.  You take drugs by injection.  You are sexually active with a partner who has HIV.  Talk with your health care provider about whether you are at high risk of being infected with HIV. If you choose to begin PrEP, you should first be tested for HIV. You should then be tested every 3 months for as long as you are taking PrEP.  Osteoporosis is a disease in  which the bones lose minerals and strength with aging. This can result in serious bone fractures or breaks. The risk of osteoporosis can be identified using a bone density scan. Women ages 64 years and over and women at risk for fractures or osteoporosis should discuss screening with their health care providers. Ask your health care provider whether you should take a calcium supplement or vitamin D to reduce the rate of osteoporosis.  Menopause can be associated with physical symptoms and risks. Hormone replacement therapy is available to decrease symptoms and risks. You should talk to your health care provider about whether hormone replacement therapy is right for you.  Use sunscreen. Apply sunscreen liberally and repeatedly throughout the day. You should seek shade when your shadow is shorter than you. Protect yourself by wearing long sleeves, pants, a wide-brimmed hat, and sunglasses year round, whenever you are outdoors.  Once a month, do a whole body skin exam, using a mirror to look at the skin on your back. Tell your health care provider of new moles, moles that have irregular borders, moles that are larger than a pencil eraser, or moles that have changed in shape or color.  Stay current with required vaccines (immunizations).  Influenza vaccine. All adults should be immunized every year.  Tetanus, diphtheria, and acellular pertussis (Td,  Tdap) vaccine. Pregnant women should receive 1 dose of Tdap vaccine during each pregnancy. The dose should be obtained regardless of the length of time since the last dose. Immunization is preferred during the 27th-36th week of gestation. An adult who has not previously received Tdap or who does not know her vaccine status should receive 1 dose of Tdap. This initial dose should be followed by tetanus and diphtheria toxoids (Td) booster doses every 10 years. Adults with an unknown or incomplete history of completing a 3-dose immunization series with Td-containing vaccines should begin or complete a primary immunization series including a Tdap dose. Adults should receive a Td booster every 10 years.  Varicella vaccine. An adult without evidence of immunity to varicella should receive 2 doses or a second dose if she has previously received 1 dose. Pregnant females who do not have evidence of immunity should receive the first dose after pregnancy. This first dose should be obtained before leaving the health care facility. The second dose should be obtained 4-8 weeks after the first dose.  Human papillomavirus (HPV) vaccine. Females aged 13-26 years who have not received the vaccine previously should obtain the 3-dose series. The vaccine is not recommended for use in pregnant females. However, pregnancy testing is not needed before receiving a dose. If a female is found to be pregnant after receiving a dose, no treatment is needed. In that case, the remaining doses should be delayed until after the pregnancy. Immunization is recommended for any person with an immunocompromised condition through the age of 43 years if she did not get any or all doses earlier. During the 3-dose series, the second dose should be obtained 4-8 weeks after the first dose. The third dose should be obtained 24 weeks after the first dose and 16 weeks after the second dose.  Zoster vaccine. One dose is recommended for adults aged 46 years or  older unless certain conditions are present.  Measles, mumps, and rubella (MMR) vaccine. Adults born before [birth year] generally are considered immune to measles and mumps. Adults born in [birth year] or later should have 1 or more doses of MMR vaccine unless there is a contraindication to the vaccine or  there is laboratory evidence of immunity to each of the three diseases. A routine second dose of MMR vaccine should be obtained at least 28 days after the first dose for students attending postsecondary schools, health care workers, or international travelers. People who received inactivated measles vaccine or an unknown type of measles vaccine during 1963-1967 should receive 2 doses of MMR vaccine. People who received inactivated mumps vaccine or an unknown type of mumps vaccine before 1979 and are at high risk for mumps infection should consider immunization with 2 doses of MMR vaccine. For females of childbearing age, rubella immunity should be determined. If there is no evidence of immunity, females who are not pregnant should be vaccinated. If there is no evidence of immunity, females who are pregnant should delay immunization until after pregnancy. Unvaccinated health care workers born before [birth year] who lack laboratory evidence of measles, mumps, or rubella immunity or laboratory confirmation of disease should consider measles and mumps immunization with 2 doses of MMR vaccine or rubella immunization with 1 dose of MMR vaccine.  Pneumococcal 13-valent conjugate (PCV13) vaccine. When indicated, a person who is uncertain of her immunization history and has no record of immunization should receive the PCV13 vaccine. An adult aged 66 years or older who has certain medical conditions and has not been previously immunized should receive 1 dose of PCV13 vaccine. This PCV13 should be followed with a dose of pneumococcal polysaccharide (PPSV23) vaccine. The PPSV23 vaccine dose should be obtained at least 8 weeks after the dose of  PCV13 vaccine. An adult aged 26 years or older who has certain medical conditions and previously received 1 or more doses of PPSV23 vaccine should receive 1 dose of PCV13. The PCV13 vaccine dose should be obtained 1 or more years after the last PPSV23 vaccine dose.  Pneumococcal polysaccharide (PPSV23) vaccine. When PCV13 is also indicated, PCV13 should be obtained first. All adults aged 55 years and older should be immunized. An adult younger than age 9 years who has certain medical conditions should be immunized. Any person who resides in a nursing home or long-term care facility should be immunized. An adult smoker should be immunized. People with an immunocompromised condition and certain other conditions should receive both PCV13 and PPSV23 vaccines. People with human immunodeficiency virus (HIV) infection should be immunized as soon as possible after diagnosis. Immunization during chemotherapy or radiation therapy should be avoided. Routine use of PPSV23 vaccine is not recommended for American Indians, [place] Natives, or people younger than 65 years unless there are medical conditions that require PPSV23 vaccine. When indicated, people who have unknown immunization and have no record of immunization should receive PPSV23 vaccine. One-time revaccination 5 years after the first dose of PPSV23 is recommended for people aged 19-64 years who have chronic kidney failure, nephrotic syndrome, asplenia, or immunocompromised conditions. People who received 1-2 doses of PPSV23 before age 69 years should receive another dose of PPSV23 vaccine at age 38 years or later if at least 5 years have passed since the previous dose. Doses of PPSV23 are not needed for people immunized with PPSV23 at or after age 29 years.  Meningococcal vaccine. Adults with asplenia or persistent complement component deficiencies should receive 2 doses of quadrivalent meningococcal conjugate (MenACWY-D) vaccine. The doses should be obtained at  least 2 months apart. Microbiologists working with certain meningococcal bacteria, [employer] recruits, people at risk during an outbreak, and people who travel to or live in countries with a high rate of meningitis should be immunized.  A first-year college student up through age 26 years who is living in a residence hall should receive a dose if she did not receive a dose on or after her 16th birthday. Adults who have certain high-risk conditions should receive one or more doses of vaccine.  Hepatitis A vaccine. Adults who wish to be protected from this disease, have certain high-risk conditions, work with hepatitis A-infected animals, work in hepatitis A research labs, or travel to or work in countries with a high rate of hepatitis A should be immunized. Adults who were previously unvaccinated and who anticipate close contact with an international adoptee during the first 60 days after arrival in the [place] from a country with a high rate of hepatitis A should be immunized.  Hepatitis B vaccine. Adults who wish to be protected from this disease, have certain high-risk conditions, may be exposed to blood or other infectious body fluids, are household contacts or sex partners of hepatitis B positive people, are clients or workers in certain care facilities, or travel to or work in countries with a high rate of hepatitis B should be immunized.  Haemophilus influenzae type b (Hib) vaccine. A previously unvaccinated person with asplenia or sickle cell disease or having a scheduled splenectomy should receive 1 dose of Hib vaccine. Regardless of previous immunization, a recipient of a hematopoietic stem cell transplant should receive a 3-dose series 6-12 months after her successful transplant. Hib vaccine is not recommended for adults with HIV infection. Preventive Services / Frequency Ages 55 to 66 years  Blood pressure check.** / Every 1 to 2 years.  Lipid and cholesterol check.** / Every 5 years  beginning at age 13.  Clinical breast exam.** / Every 3 years for women in their 19s and 22s.  BRCA-related cancer risk assessment.** / For women who have family members with a BRCA-related cancer (breast, ovarian, tubal, or peritoneal cancers).  Pap test.** / Every 2 years from ages 61 through 20. Every 3 years starting at age 17 through age 37 or 42 with a history of 3 consecutive normal Pap tests.  HPV screening.** / Every 3 years from ages 67 through ages 45 to 50 with a history of 3 consecutive normal Pap tests.  Hepatitis C blood test.** / For any individual with known risks for hepatitis C.  Skin self-exam. / Monthly.  Influenza vaccine. / Every year.  Tetanus, diphtheria, and acellular pertussis (Tdap, Td) vaccine.** / Consult your health care provider. Pregnant women should receive 1 dose of Tdap vaccine during each pregnancy. 1 dose of Td every 10 years.  Varicella vaccine.** / Consult your health care provider. Pregnant females who do not have evidence of immunity should receive the first dose after pregnancy.  HPV vaccine. / 3 doses over 6 months, if 53 and younger. The vaccine is not recommended for use in pregnant females. However, pregnancy testing is not needed before receiving a dose.  Measles, mumps, rubella (MMR) vaccine.** / You need at least 1 dose of MMR if you were born in 1957 or later. You may also need a 2nd dose. For females of childbearing age, rubella immunity should be determined. If there is no evidence of immunity, females who are not pregnant should be vaccinated. If there is no evidence of immunity, females who are pregnant should delay immunization until after pregnancy.  Pneumococcal 13-valent conjugate (PCV13) vaccine.** / Consult your health care provider.  Pneumococcal polysaccharide (PPSV23) vaccine.** / 1 to 2 doses if you smoke cigarettes or if  you have certain conditions.  Meningococcal vaccine.** / 1 dose if you are age 2 to 35 years and a  Market researcher living in a residence hall, or have one of several medical conditions, you need to get vaccinated against meningococcal disease. You may also need additional booster doses.  Hepatitis A vaccine.** / Consult your health care provider.  Hepatitis B vaccine.** / Consult your health care provider.  Haemophilus influenzae type b (Hib) vaccine.** / Consult your health care provider. Ages 75 to 33 years  Blood pressure check.** / Every 1 to 2 years.  Lipid and cholesterol check.** / Every 5 years beginning at age 40 years.  Lung cancer screening. / Every year if you are aged 108-80 years and have a 30-pack-year history of smoking and currently smoke or have quit within the past 15 years. Yearly screening is stopped once you have quit smoking for at least 15 years or develop a health problem that would prevent you from having lung cancer treatment.  Clinical breast exam.** / Every year after age 12 years.  BRCA-related cancer risk assessment.** / For women who have family members with a BRCA-related cancer (breast, ovarian, tubal, or peritoneal cancers).  Mammogram.** / Every year beginning at age 38 years and continuing for as long as you are in good health. Consult with your health care provider.  Pap test.** / Every 3 years starting at age 58 years through age 39 or 60 years with a history of 3 consecutive normal Pap tests.  HPV screening.** / Every 3 years from ages 36 years through ages 35 to 17 years with a history of 3 consecutive normal Pap tests.  Fecal occult blood test (FOBT) of stool. / Every year beginning at age 73 years and continuing until age 38 years. You may not need to do this test if you get a colonoscopy every 10 years.  Flexible sigmoidoscopy or colonoscopy.** / Every 5 years for a flexible sigmoidoscopy or every 10 years for a colonoscopy beginning at age 43 years and continuing until age 43 years.  Hepatitis C blood test.** / For all people  born from [birth year] through 1965 and any individual with known risks for hepatitis C.  Skin self-exam. / Monthly.  Influenza vaccine. / Every year.  Tetanus, diphtheria, and acellular pertussis (Tdap/Td) vaccine.** / Consult your health care provider. Pregnant women should receive 1 dose of Tdap vaccine during each pregnancy. 1 dose of Td every 10 years.  Varicella vaccine.** / Consult your health care provider. Pregnant females who do not have evidence of immunity should receive the first dose after pregnancy.  Zoster vaccine.** / 1 dose for adults aged 89 years or older.  Measles, mumps, rubella (MMR) vaccine.** / You need at least 1 dose of MMR if you were born in 1957 or later. You may also need a 2nd dose. For females of childbearing age, rubella immunity should be determined. If there is no evidence of immunity, females who are not pregnant should be vaccinated. If there is no evidence of immunity, females who are pregnant should delay immunization until after pregnancy.  Pneumococcal 13-valent conjugate (PCV13) vaccine.** / Consult your health care provider.  Pneumococcal polysaccharide (PPSV23) vaccine.** / 1 to 2 doses if you smoke cigarettes or if you have certain conditions.  Meningococcal vaccine.** / Consult your health care provider.  Hepatitis A vaccine.** / Consult your health care provider.  Hepatitis B vaccine.** / Consult your health care provider.  Haemophilus influenzae type b (Hib) vaccine.** /  Consult your health care provider. Ages 71 years and over  Blood pressure check.** / Every 1 to 2 years.  Lipid and cholesterol check.** / Every 5 years beginning at age 27 years.  Lung cancer screening. / Every year if you are aged 101-80 years and have a 30-pack-year history of smoking and currently smoke or have quit within the past 15 years. Yearly screening is stopped once you have quit smoking for at least 15 years or develop a health problem that would prevent you from  having lung cancer treatment.  Clinical breast exam.** / Every year after age 27 years.  BRCA-related cancer risk assessment.** / For women who have family members with a BRCA-related cancer (breast, ovarian, tubal, or peritoneal cancers).  Mammogram.** / Every year beginning at age 39 years and continuing for as long as you are in good health. Consult with your health care provider.  Pap test.** / Every 3 years starting at age 1 years through age 71 or 21 years with 3 consecutive normal Pap tests. Testing can be stopped between 65 and 70 years with 3 consecutive normal Pap tests and no abnormal Pap or HPV tests in the past 10 years.  HPV screening.** / Every 3 years from ages 98 years through ages 23 or 70 years with a history of 3 consecutive normal Pap tests. Testing can be stopped between 65 and 70 years with 3 consecutive normal Pap tests and no abnormal Pap or HPV tests in the past 10 years.  Fecal occult blood test (FOBT) of stool. / Every year beginning at age 54 years and continuing until age 74 years. You may not need to do this test if you get a colonoscopy every 10 years.  Flexible sigmoidoscopy or colonoscopy.** / Every 5 years for a flexible sigmoidoscopy or every 10 years for a colonoscopy beginning at age 67 years and continuing until age 8 years.  Hepatitis C blood test.** / For all people born from [birth year] through 1965 and any individual with known risks for hepatitis C.  Osteoporosis screening.** / A one-time screening for women ages 32 years and over and women at risk for fractures or osteoporosis.  Skin self-exam. / Monthly.  Influenza vaccine. / Every year.  Tetanus, diphtheria, and acellular pertussis (Tdap/Td) vaccine.** / 1 dose of Td every 10 years.  Varicella vaccine.** / Consult your health care provider.  Zoster vaccine.** / 1 dose for adults aged 84 years or older.  Pneumococcal 13-valent conjugate (PCV13) vaccine.** / Consult your health care  provider.  Pneumococcal polysaccharide (PPSV23) vaccine.** / 1 dose for all adults aged 75 years and older.  Meningococcal vaccine.** / Consult your health care provider.  Hepatitis A vaccine.** / Consult your health care provider.  Hepatitis B vaccine.** / Consult your health care provider.  Haemophilus influenzae type b (Hib) vaccine.** / Consult your health care provider. ** Family history and personal history of risk and conditions may change your health care provider's recommendations. Document Released: 05/19/2001 Document Revised: 08/07/2013 Document Reviewed: 08/18/2010 [facility] Patient Information 2015 [place], [place]. This information is not intended to replace advice given to you by your health care provider. Make sure you discuss any questions you have with your health care provider.

## 2014-07-05 NOTE — Assessment & Plan Note (Addendum)
Follows with Dr Patton Salles and Karen Hoffman in [place] for her melanoma Last pap march 2015, needs repeat in 2018  Declines colonoscopy and Cologuard Last MGM on 11/16/12 agrees to repeat in August 2016 Sees dermatology at [facility]  Patient encouraged to maintain heart healthy diet, regular exercise, adequate sleep. Consider daily probiotics. Take medications as prescribed Immunizations up to date Dexa scan ordered See patient problem list for risk factors and AVS for further recommendations for preventative care and screening

## 2014-07-05 NOTE — Assessment & Plan Note (Signed)
Encouraged heart healthy diet, increase exercise, avoid trans fats, consider a krill oil cap daily 

## 2014-07-05 NOTE — Assessment & Plan Note (Signed)
Denies CP/palp/SOB/HA/congestion/fevers/GI or GU c/o. Taking meds as prescribed 

## 2014-07-05 NOTE — Progress Notes (Signed)
Karen Hoffman  [id number] 13-May-1940 07/05/2014      Progress Note-Follow Up  Subjective  Chief Complaint  Chief Complaint  Patient presents with  . Medicare Wellness    HPI  Patient is a 74 y.o. female in today for routine medical care. Patient is in today for annual exam. 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 She also was noted to have glaucoma and cataracts and follows closely with ophthalmology. No recent changes. She denies any recent illness or acute concerns. Continues to decline colonoscopy and colorectal cancer screening. Denies CP/palp/SOB/HA/congestion/fevers/GI or GU c/o. Taking meds as prescribed  Past Medical History  Diagnosis Date  . Malignant melanoma of eye     left   . Cataracts, bilateral   . Glaucoma   . Elevated BP   . Osteoporosis   . Carcinoma in situ of skin of back   . Allergy     improved since move to [place]  . Asthma     mild, intermittent, improved since moving to [place]  . Overweight(278.02) 07/02/2010  . Thyroid disease 07/02/2010  . History of chicken pox 07/02/2010  . History of measles 07/02/2010  . Elevated LFTs 07/02/2010  . HTN (hypertension) 08/06/2010  . Hyperlipidemia 09/04/2010  . URI (upper respiratory infection) 11/17/2011  . Hypokalemia 11/18/2011  . Cervical cancer screening 06/19/2013  . Medicare annual wellness visit, subsequent 04/21/2011    Past Surgical History  Procedure Laterality Date  . Broken ankle  1997    right, screws and plates in place  . Tonsillectomy    . Left eye surgery    . Trunk skin lesion excisional biopsy      x twice    Family History  Problem Relation Age of Onset  . Cancer Mother     lung/ smoker  . Heart attack Paternal Grandmother   . Heart disease Father   . Hypertension Father   . Kidney disease Father 64    dialysis  . Obesity Father   . Diabetes type II Father   . Graves' disease Daughter   . Hypothyroidism Daughter    h/o Hashimoto's now hypothyroid  . Cholecystitis Son 18    cholecystectomy    History   Social History  . Marital Status: Widowed    Spouse Name: N/A  . Number of Children: N/A  . Years of Education: N/A   Occupational History  . Not on file.   Social History Main Topics  . Smoking status: Former Smoker -- 0.20 packs/day for 10 years    Types: Cigarettes    Quit date: 04/07/1975  . Smokeless tobacco: Never Used  . Alcohol Use: Yes     Comment: 1 glass of wine or coctail with dinner  . Drug Use: No  . Sexual Activity: No     Comment: lives with dog, no major dietary restrictions, wears seat  belt   Other Topics Concern  . Not on file   Social History Narrative    Current Outpatient Prescriptions on File Prior to Visit  Medication Sig Dispense Refill  . Albuterol Sulfate 108 (90 BASE) MCG/ACT AEPB Inhale 2 puffs into the lungs as needed.    . Calcium Citrate-Vitamin D (CALCIUM CITRATE + PO) Take 1 tablet by mouth daily.    . chlorthalidone (HYGROTON) 25 MG tablet Take 25 mg by mouth daily.   2  . dorzolamide-timolol (COSOPT) 22.3-6.8 MG/ML ophthalmic solution  Place 1 drop into both eyes 2 (two) times daily.   5  . Flaxseed, Linseed, (FLAXSEED OIL) 1000 MG CAPS Take 1 capsule by mouth daily.    Marland Kitchen latanoprost (XALATAN) 0.005 % ophthalmic solution Place 1 drop into both eyes at bedtime.   5  . losartan (COZAAR) 25 MG tablet Take 25 mg by mouth daily.  5  . milk thistle 175 MG tablet Take 250 mg by mouth daily.    . Multiple Vitamins-Minerals (CENTRUM ADULTS PO) Take 1 tablet by mouth daily.    . Probiotic Product (ALIGN PO) Take 1 capsule by mouth daily.    . timolol (TIMOPTIC) 0.5 % ophthalmic solution 1 drop 2 (two) times daily.    . vitamin C (ASCORBIC ACID) 500 MG tablet Take 500 mg by mouth daily.     No current facility-administered medications on file prior to visit.    Allergies  Allergen Reactions  . Ace Inhibitors Cough    Review of Systems  Review of  Systems  Constitutional: Negative for fever, chills and malaise/fatigue.  HENT: Negative for congestion, hearing loss and nosebleeds.   Eyes: Negative for discharge.  Respiratory: Negative for cough, sputum production, shortness of breath and wheezing.   Cardiovascular: Negative for chest pain, palpitations and leg swelling.  Gastrointestinal: Negative for heartburn, nausea, vomiting, abdominal pain, diarrhea, constipation and blood in stool.  Genitourinary: Negative for dysuria, urgency, frequency and hematuria.  Musculoskeletal: Negative for myalgias, back pain and falls.  Skin: Negative for rash.  Neurological: Negative for dizziness, tremors, sensory change, focal weakness, loss of consciousness, weakness and headaches.  Endo/Heme/Allergies: Negative for polydipsia. Does not bruise/bleed easily.  Psychiatric/Behavioral: Negative for depression and suicidal ideas. The patient is not nervous/anxious and does not have insomnia.     Objective  BP 122/76 mmHg  Pulse 59  Temp(Src) 98 F (36.7 C) (Oral)  Resp 16  Ht 5\' 5"  (1.651 m)  Wt 178 lb (80.74 kg)  BMI 29.62 kg/m2  SpO2 97%  Physical Exam  Physical Exam  Constitutional: She is oriented to person, place, and time and well-developed, well-nourished, and in no distress. No distress.  HENT:  Head: Normocephalic and atraumatic.  Right Ear: External ear normal.  Left Ear: External ear normal.  Nose: Nose normal.  Mouth/Throat: Oropharynx is clear and moist. No oropharyngeal exudate.  Eyes: Conjunctivae are normal. Pupils are equal, round, and reactive to light. Right eye exhibits no discharge. Left eye exhibits no discharge. No scleral icterus.  Neck: Normal range of motion. Neck supple. No thyromegaly present.  Cardiovascular: Normal rate, regular rhythm, normal heart sounds and intact distal pulses.   No murmur heard. Pulmonary/Chest: Effort normal and breath sounds normal. No respiratory distress. She has no wheezes. She has  no rales.  Abdominal: Soft. Bowel sounds are normal. She exhibits no distension and no mass. There is no tenderness.  Musculoskeletal: Normal range of motion. She exhibits no edema or tenderness.  Lymphadenopathy:    She has no cervical adenopathy.  Neurological: She is alert and oriented to person, place, and time. She has normal reflexes. No cranial nerve deficit. Coordination normal.  Skin: Skin is warm and dry. No rash noted. She is not diaphoretic.  Psychiatric: Mood, memory and affect normal.    Lab Results  Component Value Date   TSH 1.81 06/18/2014   Lab Results  Component Value Date   WBC 6.9 06/18/2014   HGB 15.1* 06/18/2014   HCT 44.3 06/18/2014   MCV 88.8 06/18/2014  PLT 185.0 06/18/2014   Lab Results  Component Value Date   CREATININE 0.81 06/25/2014   BUN 16 06/25/2014   NA 138 06/25/2014   K 3.9 06/25/2014   CL 100 06/25/2014   CO2 32 06/25/2014   Lab Results  Component Value Date   ALT 20 06/25/2014   AST 20 06/25/2014   ALKPHOS 82 06/25/2014   BILITOT 0.7 06/25/2014   Lab Results  Component Value Date   CHOL 202* 06/18/2014   Lab Results  Component Value Date   HDL 52.10 06/18/2014   Lab Results  Component Value Date   LDLCALC 122* 06/18/2014   Lab Results  Component Value Date   TRIG 139.0 06/18/2014   Lab Results  Component Value Date   CHOLHDL 4 06/18/2014     Assessment & Plan  HTN (hypertension) Denies CP/palp/SOB/HA/congestion/fevers/GI or GU c/o. Taking meds as prescribed   Hyperlipidemia Encouraged heart healthy diet, increase exercise, avoid trans fats, consider a krill oil cap daily   Medicare annual wellness visit, subsequent Follows with Dr Patton Salles and Corena Herter in [place] for her melanoma Last pap march 2015, needs repeat in 2018  Declines colonoscopy and Cologuard Last MGM on 11/16/12 agrees to repeat in August 2016 Sees [facility]ology at [facility]  Patient encouraged to maintain heart healthy diet, regular exercise,  adequate sleep. Consider daily probiotics. Take medications as prescribed Immunizations up to date Dexa scan ordered See patient problem list for risk factors and AVS for further recommendations for preventative care and screening    Osteoporosis Agrees to repeat Dexa scan, needs calcium and vitamin d tid and maintain adequate exercise

## 2014-07-05 NOTE — Assessment & Plan Note (Signed)
Agrees to repeat Dexa scan, needs calcium and vitamin d tid and maintain adequate exercise

## 2014-07-19 ENCOUNTER — Telehealth: Payer: Self-pay | Admitting: Family Medicine

## 2014-07-19 MED ORDER — CHLORTHALIDONE 25 MG PO TABS: 25.0000 mg | ORAL_TABLET | Freq: Every day | ORAL | Status: DC

## 2014-07-19 NOTE — Telephone Encounter (Signed)
Refill done.  

## 2014-07-25 ENCOUNTER — Other Ambulatory Visit: Payer: Self-pay | Admitting: Family Medicine

## 2014-09-12 DIAGNOSIS — H4011X1 Primary open-angle glaucoma, mild stage: Secondary | ICD-10-CM | POA: Diagnosis not present

## 2014-10-16 ENCOUNTER — Other Ambulatory Visit: Payer: Self-pay | Admitting: Family Medicine

## 2014-11-26 ENCOUNTER — Other Ambulatory Visit: Payer: Self-pay | Admitting: Family Medicine

## 2014-12-04 ENCOUNTER — Encounter: Payer: Self-pay | Admitting: Physician Assistant

## 2014-12-04 ENCOUNTER — Ambulatory Visit (INDEPENDENT_AMBULATORY_CARE_PROVIDER_SITE_OTHER): Payer: Medicare Other | Admitting: Physician Assistant

## 2014-12-04 VITALS — BP 122/90 | HR 68 | Temp 98.1°F | Resp 16 | Ht 65.0 in | Wt 176.0 lb

## 2014-12-04 DIAGNOSIS — J00 Acute nasopharyngitis [common cold]: Secondary | ICD-10-CM | POA: Diagnosis not present

## 2014-12-04 DIAGNOSIS — J4521 Mild intermittent asthma with (acute) exacerbation: Secondary | ICD-10-CM | POA: Diagnosis not present

## 2014-12-04 DIAGNOSIS — J208 Acute bronchitis due to other specified organisms: Principal | ICD-10-CM

## 2014-12-04 DIAGNOSIS — B9689 Other specified bacterial agents as the cause of diseases classified elsewhere: Secondary | ICD-10-CM | POA: Insufficient documentation

## 2014-12-04 MED ORDER — PREDNISONE 20 MG PO TABS: 40.0000 mg | ORAL_TABLET | Freq: Every day | ORAL | Status: DC

## 2014-12-04 MED ORDER — IPRATROPIUM-ALBUTEROL 0.5-2.5 (3) MG/3ML IN SOLN
3.0000 mL | Freq: Four times a day (QID) | RESPIRATORY_TRACT | Status: DC
  Administered 2014-12-04: 3 mL via RESPIRATORY_TRACT

## 2014-12-04 MED ORDER — HYDROCODONE-HOMATROPINE 5-1.5 MG/5ML PO SYRP: 5.0000 mL | ORAL_SOLUTION | Freq: Three times a day (TID) | ORAL | Status: DC | PRN

## 2014-12-04 MED ORDER — AZITHROMYCIN 250 MG PO TABS: ORAL_TABLET | ORAL | Status: DC

## 2014-12-04 NOTE — Progress Notes (Signed)
Patient presents to clinic today c/o chest congestion, productive cough, fever, ear pressure and fatigue x 2 weeks. Endorses some SOB with exertion. Denies fever, chest pain, recent travel or sick contact.    Past Medical History  Diagnosis Date  . Malignant melanoma of eye     left   . Cataracts, bilateral   . Glaucoma   . Elevated BP   . Osteoporosis   . Carcinoma in situ of skin of back   . Allergy     improved since move to [place]  . Asthma     mild, intermittent, improved since moving to [place]  . Overweight(278.02) 07/02/2010  . Thyroid disease 07/02/2010  . History of chicken pox 07/02/2010  . History of measles 07/02/2010  . Elevated LFTs 07/02/2010  . HTN (hypertension) 08/06/2010  . Hyperlipidemia 09/04/2010  . URI (upper respiratory infection) 11/17/2011  . Hypokalemia 11/18/2011  . Cervical cancer screening 06/19/2013  . Medicare annual wellness visit, subsequent 04/21/2011    Current Outpatient Prescriptions on File Prior to Visit  Medication Sig Dispense Refill  . Albuterol Sulfate 108 (90 BASE) MCG/ACT AEPB Inhale 2 puffs into the lungs as needed.    . Calcium Citrate-Vitamin D (CALCIUM CITRATE + PO) Take 1 tablet by mouth daily.    . chlorthalidone (HYGROTON) 25 MG tablet TAKE 1 TABLET (25 MG TOTAL) BY MOUTH DAILY. 30 tablet 6  . dorzolamide-timolol (COSOPT) 22.3-6.8 MG/ML ophthalmic solution Place 1 drop into both eyes 2 (two) times daily.   5  . Flaxseed, Linseed, (FLAXSEED OIL) 1000 MG CAPS Take 1 capsule by mouth daily.    Marland Kitchen KLOR-CON M20 20 MEQ tablet Take 1 tablet (20 mEq total) by mouth 2 (two) times daily. 60 tablet 3  . KLOR-CON M20 20 MEQ tablet TAKE 1 TABLET BY MOUTH EVERY DAY 30 tablet 2  . latanoprost (XALATAN) 0.005 % ophthalmic solution Place 1 drop into both eyes at bedtime.   5  . losartan (COZAAR) 25 MG tablet Take 25 mg by mouth daily.  5  . losartan (COZAAR) 25 MG tablet TAKE 1 TABLET BY MOUTH EVERY DAY 30 tablet 6  . milk thistle 175 MG tablet Take 250 mg  by mouth daily.    . Multiple Vitamins-Minerals (CENTRUM ADULTS PO) Take 1 tablet by mouth daily.    Marland Kitchen PROAIR HFA 108 (90 BASE) MCG/ACT inhaler INHALE 2 PUFFS INTO THE LUNGS EVERY 6 HOURS AS NEEDED FOR WHEEZING OR SHORTNESS OF BREATH 8.5 Inhaler 4  . Probiotic Product (ALIGN PO) Take 1 capsule by mouth daily.    . timolol (TIMOPTIC) 0.5 % ophthalmic solution 1 drop 2 (two) times daily.    . vitamin C (ASCORBIC ACID) 500 MG tablet Take 500 mg by mouth daily.     No current facility-administered medications on file prior to visit.    Allergies  Allergen Reactions  . Ace Inhibitors Cough    Family History  Problem Relation Age of Onset  . Cancer Mother     lung/ smoker  . Heart attack Paternal Grandmother   . Heart disease Father   . Hypertension Father   . Kidney disease Father 34    dialysis  . Obesity Father   . Diabetes type II Father   . Graves' disease Daughter   . Hypothyroidism Daughter     h/o Hashimoto's now hypothyroid  . Cholecystitis Son 50    cholecystectomy    Social History   Social History  . Marital Status: Widowed  Spouse Name: N/A  . Number of Children: N/A  . Years of Education: N/A   Social History Main Topics  . Smoking status: Former Smoker -- 0.20 packs/day for 10 years    Types: Cigarettes    Quit date: 04/07/1975  . Smokeless tobacco: Never Used  . Alcohol Use: Yes     Comment: 1 glass of wine or coctail with dinner  . Drug Use: No  . Sexual Activity: No     Comment: lives with dog, no major dietary restrictions, wears seat  belt   Other Topics Concern  . None   Social History Narrative   Review of Systems - See HPI.  All other ROS are negative.  BP 122/90 mmHg  Pulse 68  Temp(Src) 98.1 F (36.7 C) (Oral)  Resp 16  Ht 5\' 5"  (1.651 m)  Wt 176 lb (79.833 kg)  BMI 29.29 kg/m2  SpO2 97%  Physical Exam  Constitutional: She is oriented to person, place, and time and well-developed, well-nourished, and in no distress.  HENT:    Head: Normocephalic and atraumatic.  Eyes: Conjunctivae are normal.  Cardiovascular: Normal rate, regular rhythm, normal heart sounds and intact distal pulses.   Pulmonary/Chest: Effort normal. No respiratory distress. She has wheezes. She has no rales. She exhibits no tenderness.  Neurological: She is alert and oriented to person, place, and time.  Skin: Skin is warm and dry. No rash noted.  Psychiatric: Affect normal.  Vitals reviewed.  Assessment/Plan: Acute bacterial bronchitis With reactive airway disease. Rx Azithromycin. Take as directed. Increase fluids. Continue Proair. Hycodan refilled. Duoneb x 1 given in office. Rx prednisone 5-day burst of 40 mg. Follow-up if symptoms are not improving.

## 2014-12-04 NOTE — Patient Instructions (Signed)
Please take antibiotic as directed. Stay well hydrated. Use Mucinex twice daily. Continue cough syrup ( a new prescription has been sent in). Continue inhaler. Take prednisone as directed. Rest.  Call or return to clinic if symptoms are not resolving.

## 2014-12-04 NOTE — Progress Notes (Signed)
Pre visit review using our clinic review tool, if applicable. No additional management support is needed unless otherwise documented below in the visit note/SLS  

## 2014-12-04 NOTE — Assessment & Plan Note (Signed)
With reactive airway disease. Rx Azithromycin. Take as directed. Increase fluids. Continue Proair. Hycodan refilled. Duoneb x 1 given in office. Rx prednisone 5-day burst of 40 mg. Follow-up if symptoms are not improving.

## 2014-12-17 DIAGNOSIS — H3589 Other specified retinal disorders: Secondary | ICD-10-CM | POA: Diagnosis not present

## 2014-12-17 DIAGNOSIS — C6932 Malignant neoplasm of left choroid: Secondary | ICD-10-CM | POA: Diagnosis not present

## 2014-12-17 DIAGNOSIS — T66XXXS Radiation sickness, unspecified, sequela: Secondary | ICD-10-CM | POA: Diagnosis not present

## 2014-12-17 DIAGNOSIS — C6992 Malignant neoplasm of unspecified site of left eye: Secondary | ICD-10-CM | POA: Diagnosis not present

## 2014-12-26 ENCOUNTER — Other Ambulatory Visit: Payer: Self-pay | Admitting: Family Medicine

## 2014-12-26 NOTE — Telephone Encounter (Signed)
LOV and labs- 06/25/14  Lab msg states no changes to meds.

## 2015-03-05 ENCOUNTER — Telehealth: Payer: Self-pay | Admitting: Family Medicine

## 2015-03-05 NOTE — Telephone Encounter (Signed)
Called patient to schedule Flu Shot on 11/29. Patient declined because she is getting her Flu Shot at the Pharmacy.

## 2015-03-05 NOTE — Telephone Encounter (Signed)
Called the patient to inform to have the pharmacy or her to let us know date/pharmacy name where she gets flu shot done at.

## 2015-03-07 ENCOUNTER — Ambulatory Visit (INDEPENDENT_AMBULATORY_CARE_PROVIDER_SITE_OTHER): Payer: Medicare Other | Admitting: Medical

## 2015-03-07 ENCOUNTER — Encounter: Payer: Self-pay | Admitting: Medical

## 2015-03-07 VITALS — BP 120/80 | HR 77 | Temp 97.9°F | Ht 64.75 in | Wt 178.0 lb

## 2015-03-07 DIAGNOSIS — J309 Allergic rhinitis, unspecified: Secondary | ICD-10-CM | POA: Diagnosis not present

## 2015-03-07 DIAGNOSIS — J0100 Acute maxillary sinusitis, unspecified: Secondary | ICD-10-CM | POA: Diagnosis not present

## 2015-03-07 MED ORDER — AZITHROMYCIN 250 MG PO TABS: ORAL_TABLET | ORAL | Status: DC

## 2015-03-07 MED ORDER — BENZONATATE 100 MG PO CAPS: 100.0000 mg | ORAL_CAPSULE | Freq: Three times a day (TID) | ORAL | Status: DC | PRN

## 2015-03-07 MED ORDER — FLUTICASONE PROPIONATE 50 MCG/ACT NA SUSP: 2.0000 | Freq: Every day | NASAL | Status: DC

## 2015-03-07 NOTE — Progress Notes (Signed)
Pre visit review using our clinic review tool, if applicable. No additional management support is needed unless otherwise documented below in the visit note. 

## 2015-03-07 NOTE — Patient Instructions (Addendum)
For allergic rhinitis will rx flonase nasal spray.  For sinus infection will rx azithromycin. I would recommend use flonase and see if this clears sinus pressure. Then start antibiotic.  For cough rx benzonatate.  Follow up in 7 days or as needed  Recommend to pt get flu vaccine next week when better.

## 2015-03-07 NOTE — Progress Notes (Signed)
Subjective:    Patient ID: Karen Hoffman, female    DOB: 1941/03/21, 74 y.o.   MRN: [id number]  HPI  Pt in onset of illness since last night. She has sinus pressure, pnd, tickle in throat and some itchy ears. Some mild occasional cough. Pt is taking mucinex dm and claritin.   Some sneezing. No itching eyes.  Pt in early since she has bronchitis type symptoms for about 8 week early September to late October. So she want to catch this early.  Review of Systems  Constitutional: Negative for fever, chills and fatigue.  HENT: Positive for congestion, postnasal drip, sinus pressure and sneezing. Negative for sore throat.   Respiratory: Positive for cough. Negative for wheezing.        Rare cough.  Cardiovascular: Negative for chest pain and palpitations.  Musculoskeletal: Negative for neck pain.  Neurological: Negative for dizziness and headaches.  Hematological: Negative for adenopathy. Does not bruise/bleed easily.    Past Medical History  Diagnosis Date  . Malignant melanoma of eye ([place])     left   . Cataracts, bilateral   . Glaucoma   . Elevated BP   . Osteoporosis   . Carcinoma in situ of skin of back   . Allergy     improved since move to [place]  . Asthma     mild, intermittent, improved since moving to [place]  . Overweight(278.02) 07/02/2010  . Thyroid disease 07/02/2010  . History of chicken pox 07/02/2010  . History of measles 07/02/2010  . Elevated LFTs 07/02/2010  . HTN (hypertension) 08/06/2010  . Hyperlipidemia 09/04/2010  . URI (upper respiratory infection) 11/17/2011  . Hypokalemia 11/18/2011  . Cervical cancer screening 06/19/2013  . Medicare annual wellness visit, subsequent 04/21/2011    Social History   Social History  . Marital Status: Widowed    Spouse Name: N/A  . Number of Children: N/A  . Years of Education: N/A   Occupational History  . Not on file.   Social History Main Topics  . Smoking status: Former Smoker -- 0.20 packs/day for 10 years   Types: Cigarettes    Quit date: 04/07/1975  . Smokeless tobacco: Never Used  . Alcohol Use: Yes     Comment: 1 glass of wine or coctail with dinner  . Drug Use: No  . Sexual Activity: No     Comment: lives with dog, no major dietary restrictions, wears seat  belt   Other Topics Concern  . Not on file   Social History Narrative    Past Surgical History  Procedure Laterality Date  . Broken ankle  1997    right, screws and plates in place  . Tonsillectomy    . Left eye surgery    . Trunk skin lesion excisional biopsy      x twice    Family History  Problem Relation Age of Onset  . Cancer Mother     lung/ smoker  . Heart attack Paternal Grandmother   . Heart disease Father   . Hypertension Father   . Kidney disease Father 23    dialysis  . Obesity Father   . Diabetes type II Father   . Graves' disease Daughter   . Hypothyroidism Daughter     h/o Hashimoto's now hypothyroid  . Cholecystitis Son 63    cholecystectomy    Allergies  Allergen Reactions  . Ace Inhibitors Cough    Current Outpatient Prescriptions on File Prior to Visit  Medication Sig  Dispense Refill  . Albuterol Sulfate 108 (90 BASE) MCG/ACT AEPB Inhale 2 puffs into the lungs as needed.    . Calcium Citrate-Vitamin D (CALCIUM CITRATE + PO) Take 1 tablet by mouth daily.    . chlorthalidone (HYGROTON) 25 MG tablet TAKE 1 TABLET (25 MG TOTAL) BY MOUTH DAILY. 30 tablet 6  . dextromethorphan-guaiFENesin (MUCINEX DM) 30-600 MG per 12 hr tablet Take 1 tablet by mouth 2 (two) times daily.    . dorzolamide-timolol (COSOPT) 22.3-6.8 MG/ML ophthalmic solution Place 1 drop into both eyes 2 (two) times daily.   5  . Flaxseed, Linseed, (FLAXSEED OIL) 1000 MG CAPS Take 1 capsule by mouth daily.    Marland Kitchen HYDROcodone-homatropine (HYCODAN) 5-1.5 MG/5ML syrup Take 5 mLs by mouth every 8 (eight) hours as needed for cough. 120 mL 0  . KLOR-CON M20 20 MEQ tablet TAKE 1 TABLET BY MOUTH EVERY DAY 30 tablet 2  . KLOR-CON M20 20  MEQ tablet TAKE 1 TABLET (20 MEQ TOTAL) BY MOUTH 2 (TWO) TIMES DAILY. 60 tablet 3  . latanoprost (XALATAN) 0.005 % ophthalmic solution Place 1 drop into both eyes at bedtime.   5  . loratadine (CLARITIN) 10 MG tablet Take 10 mg by mouth daily.    Marland Kitchen losartan (COZAAR) 25 MG tablet Take 25 mg by mouth daily.  5  . losartan (COZAAR) 25 MG tablet TAKE 1 TABLET BY MOUTH EVERY DAY 30 tablet 6  . milk thistle 175 MG tablet Take 250 mg by mouth daily.    . Multiple Vitamins-Minerals (CENTRUM ADULTS PO) Take 1 tablet by mouth daily.    . predniSONE (DELTASONE) 20 MG tablet Take 2 tablets (40 mg total) by mouth daily with breakfast. 10 tablet 0  . PROAIR HFA 108 (90 BASE) MCG/ACT inhaler INHALE 2 PUFFS INTO THE LUNGS EVERY 6 HOURS AS NEEDED FOR WHEEZING OR SHORTNESS OF BREATH 8.5 Inhaler 4  . Probiotic Product (ALIGN PO) Take 1 capsule by mouth daily.    . timolol (TIMOPTIC) 0.5 % ophthalmic solution 1 drop 2 (two) times daily.    . vitamin C (ASCORBIC ACID) 500 MG tablet Take 500 mg by mouth daily.     No current facility-administered medications on file prior to visit.    BP 120/80 mmHg  Pulse 77  Temp(Src) 97.9 F (36.6 C) (Oral)  Ht 5' 4.75" (1.645 m)  Wt 178 lb (80.74 kg)  BMI 29.84 kg/m2  SpO2 97%       Objective:   Physical Exam   General  Mental Status - Alert. General Appearance - Well groomed. Not in acute distress.  Skin Rashes- No Rashes.  HEENT Head- Normal. Ear Auditory Canal - Left- Normal. Right - Normal.Tympanic Membrane- Left- Normal. Right- Normal. Eye Sclera/Conjunctiva- Left- Normal. Right- Normal. Nose & Sinuses Nasal Mucosa- Left-  Boggy and Congested. Right-  Boggy and  Congested.Bilateral maxillary and frontal sinus pressure. Mouth & Throat Lips: Upper Lip- Normal: no dryness, cracking, pallor, cyanosis, or vesicular eruption. Lower Lip-Normal: no dryness, cracking, pallor, cyanosis or vesicular eruption. Buccal Mucosa- Bilateral- No Aphthous  ulcers. Oropharynx- No Discharge or Erythema. Tonsils: Characteristics- Bilateral- No Erythema or Congestion. Size/Enlargement- Bilateral- No enlargement. Discharge- bilateral-None.  Neck Neck- Supple. No Masses.   Chest and Lung Exam Auscultation: Breath Sounds:-Clear even and unlabored.  Cardiovascular Auscultation:Rythm- Regular, rate and rhythm. Murmurs & Other Heart Sounds:Ausculatation of the heart reveal- No Murmurs.  Lymphatic Head & Neck General Head & Neck Lymphatics: Bilateral: Description- No Localized lymphadenopathy.  Assessment & Plan:  For allergic rhinitis will rx flonase nasal spray.  For sinus infection will rx azithromycin. I would recommend use flonase and see if this clears sinus pressure. Then start antibiotic.  For cough rx benzonatate.  Follow up in 7 days or as needed

## 2015-03-12 DIAGNOSIS — H25813 Combined forms of age-related cataract, bilateral: Secondary | ICD-10-CM | POA: Diagnosis not present

## 2015-03-12 DIAGNOSIS — H401112 Primary open-angle glaucoma, right eye, moderate stage: Secondary | ICD-10-CM | POA: Diagnosis not present

## 2015-03-12 DIAGNOSIS — H401123 Primary open-angle glaucoma, left eye, severe stage: Secondary | ICD-10-CM | POA: Diagnosis not present

## 2015-03-12 DIAGNOSIS — H02724 Madarosis of left upper eyelid and periocular area: Secondary | ICD-10-CM | POA: Diagnosis not present

## 2015-03-12 DIAGNOSIS — H5213 Myopia, bilateral: Secondary | ICD-10-CM | POA: Diagnosis not present

## 2015-03-14 ENCOUNTER — Ambulatory Visit: Payer: Medicare Other

## 2015-04-25 DIAGNOSIS — H401123 Primary open-angle glaucoma, left eye, severe stage: Secondary | ICD-10-CM | POA: Diagnosis not present

## 2015-05-03 ENCOUNTER — Other Ambulatory Visit: Payer: Self-pay | Admitting: Family Medicine

## 2015-05-06 ENCOUNTER — Telehealth: Payer: Self-pay | Admitting: Family Medicine

## 2015-05-06 NOTE — Telephone Encounter (Signed)
LM for pt to call and schedule flu shot or update records. °

## 2015-05-21 DIAGNOSIS — L821 Other seborrheic keratosis: Secondary | ICD-10-CM | POA: Diagnosis not present

## 2015-05-21 DIAGNOSIS — L814 Other melanin hyperpigmentation: Secondary | ICD-10-CM | POA: Diagnosis not present

## 2015-05-21 DIAGNOSIS — Z85820 Personal history of malignant melanoma of skin: Secondary | ICD-10-CM | POA: Diagnosis not present

## 2015-05-21 DIAGNOSIS — L309 Dermatitis, unspecified: Secondary | ICD-10-CM | POA: Diagnosis not present

## 2015-05-31 ENCOUNTER — Other Ambulatory Visit: Payer: Self-pay | Admitting: Family Medicine

## 2015-06-03 ENCOUNTER — Encounter: Payer: Self-pay | Admitting: Family Medicine

## 2015-06-04 ENCOUNTER — Other Ambulatory Visit: Payer: Self-pay

## 2015-06-04 DIAGNOSIS — E785 Hyperlipidemia, unspecified: Secondary | ICD-10-CM

## 2015-06-04 DIAGNOSIS — M810 Age-related osteoporosis without current pathological fracture: Secondary | ICD-10-CM

## 2015-06-04 DIAGNOSIS — E782 Mixed hyperlipidemia: Secondary | ICD-10-CM

## 2015-06-04 DIAGNOSIS — I10 Essential (primary) hypertension: Secondary | ICD-10-CM

## 2015-06-04 NOTE — Telephone Encounter (Signed)
Patients labs have been put in for march 16th and pt has been notified and apt is scheduled for patients labs.

## 2015-06-13 DIAGNOSIS — H401123 Primary open-angle glaucoma, left eye, severe stage: Secondary | ICD-10-CM | POA: Diagnosis not present

## 2015-06-13 DIAGNOSIS — H401112 Primary open-angle glaucoma, right eye, moderate stage: Secondary | ICD-10-CM | POA: Diagnosis not present

## 2015-06-20 ENCOUNTER — Other Ambulatory Visit (INDEPENDENT_AMBULATORY_CARE_PROVIDER_SITE_OTHER): Payer: Medicare Other

## 2015-06-20 DIAGNOSIS — E782 Mixed hyperlipidemia: Secondary | ICD-10-CM

## 2015-06-20 DIAGNOSIS — E785 Hyperlipidemia, unspecified: Secondary | ICD-10-CM | POA: Diagnosis not present

## 2015-06-20 DIAGNOSIS — M810 Age-related osteoporosis without current pathological fracture: Secondary | ICD-10-CM

## 2015-06-20 DIAGNOSIS — I10 Essential (primary) hypertension: Secondary | ICD-10-CM

## 2015-06-20 LAB — COMPREHENSIVE METABOLIC PANEL
ALK PHOS: 97 U/L (ref 39–117)
ALT: 34 U/L (ref 0–35)
AST: 27 U/L (ref 0–37)
Albumin: 4.1 g/dL (ref 3.5–5.2)
BUN: 16 mg/dL (ref 6–23)
CALCIUM: 9.5 mg/dL (ref 8.4–10.5)
CO2: 32 mEq/L (ref 19–32)
Chloride: 98 mEq/L (ref 96–112)
Creatinine, Ser: 0.79 mg/dL (ref 0.40–1.20)
GFR: 75.48 mL/min (ref >60.00)
GLUCOSE: 101 mg/dL — AB (ref 70–99)
POTASSIUM: 3.4 meq/L — AB (ref 3.5–5.1)
Sodium: 136 mEq/L (ref 135–145)
TOTAL PROTEIN: 7.1 g/dL (ref 6.0–8.3)
Total Bilirubin: 0.8 mg/dL (ref 0.2–1.2)

## 2015-06-20 LAB — TSH: TSH: 2.26 u[IU]/mL (ref 0.35–4.50)

## 2015-06-20 LAB — CBC
HCT: 43.9 % (ref 36.0–46.0)
HEMOGLOBIN: 14.8 g/dL (ref 12.0–15.0)
MCHC: 33.6 g/dL (ref 30.0–36.0)
MCV: 89.1 fl (ref 78.0–100.0)
PLATELETS: 239 10*3/uL (ref 150.0–400.0)
RBC: 4.92 Mil/uL (ref 3.87–5.11)
RDW: 13.7 % (ref 11.5–15.5)
WBC: 8.9 10*3/uL (ref 4.0–10.5)

## 2015-06-20 LAB — LIPID PANEL
CHOL/HDL RATIO: 4
Cholesterol: 198 mg/dL (ref 0–200)
HDL: 49.3 mg/dL (ref >39.00)
LDL CALC: 119 mg/dL — AB (ref 0–99)
NONHDL: 148.78
TRIGLYCERIDES: 150 mg/dL — AB (ref 0.0–149.0)
VLDL: 30 mg/dL (ref 0.0–40.0)

## 2015-06-20 LAB — VITAMIN D 25 HYDROXY (VIT D DEFICIENCY, FRACTURES): VITD: 28.42 ng/mL — AB (ref 30.00–100.00)

## 2015-06-27 ENCOUNTER — Encounter: Payer: Self-pay | Admitting: Family Medicine

## 2015-06-27 ENCOUNTER — Ambulatory Visit (INDEPENDENT_AMBULATORY_CARE_PROVIDER_SITE_OTHER): Payer: Medicare Other | Admitting: Family Medicine

## 2015-06-27 VITALS — BP 120/82 | HR 62 | Temp 97.9°F | Ht 65.0 in | Wt 174.0 lb

## 2015-06-27 DIAGNOSIS — Z23 Encounter for immunization: Secondary | ICD-10-CM | POA: Diagnosis not present

## 2015-06-27 DIAGNOSIS — E663 Overweight: Secondary | ICD-10-CM | POA: Diagnosis not present

## 2015-06-27 DIAGNOSIS — Z0000 Encounter for general adult medical examination without abnormal findings: Secondary | ICD-10-CM | POA: Diagnosis not present

## 2015-06-27 DIAGNOSIS — E876 Hypokalemia: Secondary | ICD-10-CM | POA: Diagnosis not present

## 2015-06-27 DIAGNOSIS — C6992 Malignant neoplasm of unspecified site of left eye: Secondary | ICD-10-CM

## 2015-06-27 DIAGNOSIS — H269 Unspecified cataract: Secondary | ICD-10-CM

## 2015-06-27 DIAGNOSIS — M810 Age-related osteoporosis without current pathological fracture: Secondary | ICD-10-CM | POA: Diagnosis not present

## 2015-06-27 DIAGNOSIS — E559 Vitamin D deficiency, unspecified: Secondary | ICD-10-CM

## 2015-06-27 DIAGNOSIS — I10 Essential (primary) hypertension: Secondary | ICD-10-CM | POA: Diagnosis not present

## 2015-06-27 HISTORY — DX: Vitamin D deficiency, unspecified: E55.9

## 2015-06-27 LAB — COMPREHENSIVE METABOLIC PANEL
ALBUMIN: 4 g/dL (ref 3.5–5.2)
ALT: 24 U/L (ref 0–35)
AST: 21 U/L (ref 0–37)
Alkaline Phosphatase: 83 U/L (ref 39–117)
BUN: 19 mg/dL (ref 6–23)
CHLORIDE: 100 meq/L (ref 96–112)
CO2: 32 mEq/L (ref 19–32)
Calcium: 9.8 mg/dL (ref 8.4–10.5)
Creatinine, Ser: 0.79 mg/dL (ref 0.40–1.20)
GFR: 75.47 mL/min (ref >60.00)
GLUCOSE: 100 mg/dL — AB (ref 70–99)
POTASSIUM: 3.3 meq/L — AB (ref 3.5–5.1)
SODIUM: 139 meq/L (ref 135–145)
Total Bilirubin: 0.8 mg/dL (ref 0.2–1.2)
Total Protein: 7 g/dL (ref 6.0–8.3)

## 2015-06-27 NOTE — Assessment & Plan Note (Signed)
Patient denies any difficulties at home. No trouble with ADLs, depression or falls. See EMR for functional status screen and depression screen. No recent changes to vision or hearing. Is UTD with immunizations. Is UTD with screening. Discussed Advanced Directives. Encouraged heart healthy diet, exercise as tolerated and adequate sleep. See patient's problem list for health risk factors to monitor. See AVS for preventative healthcare recommendation schedule. Preferred Pharmacy and which med where:Local 90 day supply/mail order: na Local pharmacy: [facility] [place]  Allergies verified:UTD  Immunization Status: Prompted for insurance verification: na Flu vaccine--9.10.15 Tdap--1.15.2013 PNA--(23) 1.12007 (13) 3.16.2015 Shingles--8.1.2013  A/P:  Changes to FH, PSH or Personal Hx:UTD Pap--na MMG--8.13.2014--neg--q 2 years Bone Density--not on file CCS--not on file  Care Teams Updated:Updated ED/Hospital/Urgent Care Visits:none Prompted for: Updated insurance, contact information, forms:UTD Remind to bring: DPR information, advance directives: has Living will and POA

## 2015-06-27 NOTE — Assessment & Plan Note (Signed)
Continues to [place] at [facility] annually, doing very well, tumor is regressing. Was initially above 5 and is now down to 2. 3 ish. She is tolerating treatment well except for lost some eye lashes and her eyes are dry. Planning cataract surgery in the fall in her right eye

## 2015-06-27 NOTE — Assessment & Plan Note (Signed)
Encouraged increased potassium in diet, given handout

## 2015-06-27 NOTE — Patient Instructions (Signed)
[employer] 10 strain probiotics.daily, [email or web address] Vitamin D 1000 to 2000 IU daily Bone density shows osteopenia, which is thinner than normal but not as bad as osteoporosis. Recommend calcium intake of 1200 to 1500 mg daily, divided into roughly 3 doses. Best source is the diet and a single dairy serving is about 500 mg, a supplement of calcium citrate once or twice daily Preventive Care for Adults, Female A healthy lifestyle and preventive care can promote health and wellness. Preventive health guidelines for womendaily include the following key practices.  A routine yearly physical is a good way to check with your health care provider about your health and preventive screening. It is a chance to share any concerns and updates on your health and to receive a thorough exam.  Visit your dentist for a routine exam and preventive care every 6 months. Brush your teeth twice a day and floss once a day. Good oral hygiene prevents tooth decay and gum disease.  The frequency of eye exams is based on your age, health, family medical history, use of contact lenses, and other factors. Follow your health care provider's recommendations for frequency of eye exams.  Eat a healthy diet. Foods like vegetables, fruits, whole grains, low-fat dairy products, and lean protein foods contain the nutrients you need without too many calories. Decrease your intake of foods high in solid fats, added sugars, and salt. Eat the right amount of calories for you.Get information about a proper diet from your health care provider, if necessary.  Regular physical exercise is one of the most important things you can do for your health. Most adults should get at least 150 minutes of moderate-intensity exercise (any activity that increases your heart rate and causes you to sweat) each week. In addition, most adults need muscle-strengthening exercises on 2 or more days a week.  Maintain a healthy weight. The body mass index  (BMI) is a screening tool to identify possible weight problems. It provides an estimate of body fat based on height and weight. Your health care provider can find your BMI and can help you achieve or maintain a healthy weight.For adults 20 years and older:  A BMI below 18.5 is considered underweight.  A BMI of 18.5 to 24.9 is normal.  A BMI of 25 to 29.9 is considered overweight.  A BMI of 30 and above is considered obese.  Maintain normal blood lipids and cholesterol levels by exercising and minimizing your intake of saturated fat. Eat a balanced diet with plenty of fruit and vegetables. Blood tests for lipids and cholesterol should begin at age 31 and be repeated every 5 years. If your lipid or cholesterol levels are high, you are over 50, or you are at high risk for heart disease, you may need your cholesterol levels checked more frequently.Ongoing high lipid and cholesterol levels should be treated with medicines if diet and exercise are not working.  If you smoke, find out from your health care provider how to quit. If you do not use tobacco, do not start.  Lung cancer screening is recommended for adults aged 12-80 years who are at high risk for developing lung cancer because of a history of smoking. A yearly low-dose CT scan of the lungs is recommended for people who have at least a 30-pack-year history of smoking and are a current smoker or have quit within the past 15 years. A pack year of smoking is smoking an average of 1 pack of cigarettes a day for 1 year (  for example: 1 pack a day for 30 years or 2 packs a day for 15 years). Yearly screening should continue until the smoker has stopped smoking for at least 15 years. Yearly screening should be stopped for people who develop a health problem that would prevent them from having lung cancer treatment.  If you are pregnant, do not drink alcohol. If you are breastfeeding, be very cautious about drinking alcohol. If you are not pregnant and  choose to drink alcohol, do not have more than 1 drink per day. One drink is considered to be 12 ounces (355 mL) of beer, 5 ounces (148 mL) of wine, or 1.5 ounces (44 mL) of liquor.  Avoid use of street drugs. Do not share needles with anyone. Ask for help if you need support or instructions about stopping the use of drugs.  High blood pressure causes heart disease and increases the risk of stroke. Your blood pressure should be checked at least every 1 to 2 years. Ongoing high blood pressure should be treated with medicines if weight loss and exercise do not work.  If you are 26-57 years old, ask your health care provider if you should take aspirin to prevent strokes.  Diabetes screening is done by taking a blood sample to check your blood glucose level after you have not eaten for a certain period of time (fasting). If you are not overweight and you do not have risk factors for diabetes, you should be screened once every 3 years starting at age 12. If you are overweight or obese and you are 15-22 years of age, you should be screened for diabetes every year as part of your cardiovascular risk assessment.  Breast cancer screening is essential preventive care for women. You should practice "breast self-awareness." This means understanding the normal appearance and feel of your breasts and may include breast self-examination. Any changes detected, no matter how small, should be reported to a health care provider. Women in their 12s and 30s should have a clinical breast exam (CBE) by a health care provider as part of a regular health exam every 1 to 3 years. After age 21, women should have a CBE every year. Starting at age 61, women should consider having a mammogram (breast X-ray test) every year. Women who have a family history of breast cancer should talk to their health care provider about genetic screening. Women at a high risk of breast cancer should talk to their health care providers about having an  MRI and a mammogram every year.  Breast cancer gene (BRCA)-related cancer risk assessment is recommended for women who have family members with BRCA-related cancers. BRCA-related cancers include breast, ovarian, tubal, and peritoneal cancers. Having family members with these cancers may be associated with an increased risk for harmful changes (mutations) in the breast cancer genes BRCA1 and BRCA2. Results of the assessment will determine the need for genetic counseling and BRCA1 and BRCA2 testing.  Your health care provider may recommend that you be screened regularly for cancer of the pelvic organs (ovaries, uterus, and vagina). This screening involves a pelvic examination, including checking for microscopic changes to the surface of your cervix (Pap test). You may be encouraged to have this screening done every 3 years, beginning at age 26.  For women ages 51-65, health care providers may recommend pelvic exams and Pap testing every 3 years, or they may recommend the Pap and pelvic exam, combined with testing for human papilloma virus (HPV), every 5 years. Some types  of HPV increase your risk of cervical cancer. Testing for HPV may also be done on women of any age with unclear Pap test results.  Other health care providers may not recommend any screening for nonpregnant women who are considered low risk for pelvic cancer and who do not have symptoms. Ask your health care provider if a screening pelvic exam is right for you.  If you have had past treatment for cervical cancer or a condition that could lead to cancer, you need Pap tests and screening for cancer for at least 20 years after your treatment. If Pap tests have been discontinued, your risk factors (such as having a new sexual partner) need to be reassessed to determine if screening should resume. Some women have medical problems that increase the chance of getting cervical cancer. In these cases, your health care provider may recommend more  frequent screening and Pap tests.  Colorectal cancer can be detected and often prevented. Most routine colorectal cancer screening begins at the age of 63 years and continues through age 51 years. However, your health care provider may recommend screening at an earlier age if you have risk factors for colon cancer. On a yearly basis, your health care provider may provide home test kits to check for hidden blood in the stool. Use of a small camera at the end of a tube, to directly examine the colon (sigmoidoscopy or colonoscopy), can detect the earliest forms of colorectal cancer. Talk to your health care provider about this at age 83, when routine screening begins. Direct exam of the colon should be repeated every 5-10 years through age 37 years, unless early forms of precancerous polyps or small growths are found.  People who are at an increased risk for hepatitis B should be screened for this virus. You are considered at high risk for hepatitis B if:  You were born in a country where hepatitis B occurs often. Talk with your health care provider about which countries are considered high risk.  Your parents were born in a high-risk country and you have not received a shot to protect against hepatitis B (hepatitis B vaccine).  You have HIV or AIDS.  You use needles to inject street drugs.  You live with, or have sex with, someone who has hepatitis B.  You get hemodialysis treatment.  You take certain medicines for conditions like cancer, organ transplantation, and autoimmune conditions.  Hepatitis C blood testing is recommended for all people born from [birth year] through 1965 and any individual with known risks for hepatitis C.  Practice safe sex. Use condoms and avoid high-risk sexual practices to reduce the spread of sexually transmitted infections (STIs). STIs include gonorrhea, chlamydia, syphilis, trichomonas, herpes, HPV, and human immunodeficiency virus (HIV). Herpes, HIV, and HPV are viral  illnesses that have no cure. They can result in disability, cancer, and death.  You should be screened for sexually transmitted illnesses (STIs) including gonorrhea and chlamydia if:  You are sexually active and are younger than 24 years.  You are older than 24 years and your health care provider tells you that you are at risk for this type of infection.  Your sexual activity has changed since you were last screened and you are at an increased risk for chlamydia or gonorrhea. Ask your health care provider if you are at risk.  If you are at risk of being infected with HIV, it is recommended that you take a prescription medicine daily to prevent HIV infection. This is called  preexposure prophylaxis (PrEP). You are considered at risk if:  You are sexually active and do not regularly use condoms or know the HIV status of your partner(s).  You take drugs by injection.  You are sexually active with a partner who has HIV.  Talk with your health care provider about whether you are at high risk of being infected with HIV. If you choose to begin PrEP, you should first be tested for HIV. You should then be tested every 3 months for as long as you are taking PrEP.  Osteoporosis is a disease in which the bones lose minerals and strength with aging. This can result in serious bone fractures or breaks. The risk of osteoporosis can be identified using a bone density scan. Women ages 63 years and over and women at risk for fractures or osteoporosis should discuss screening with their health care providers. Ask your health care provider whether you should take a calcium supplement or vitamin D to reduce the rate of osteoporosis.  Menopause can be associated with physical symptoms and risks. Hormone replacement therapy is available to decrease symptoms and risks. You should talk to your health care provider about whether hormone replacement therapy is right for you.  Use sunscreen. Apply sunscreen liberally and  repeatedly throughout the day. You should seek shade when your shadow is shorter than you. Protect yourself by wearing long sleeves, pants, a wide-brimmed hat, and sunglasses year round, whenever you are outdoors.  Once a month, do a whole body skin exam, using a mirror to look at the skin on your back. Tell your health care provider of new moles, moles that have irregular borders, moles that are larger than a pencil eraser, or moles that have changed in shape or color.  Stay current with required vaccines (immunizations).  Influenza vaccine. All adults should be immunized every year.  Tetanus, diphtheria, and acellular pertussis (Td, Tdap) vaccine. Pregnant women should receive 1 dose of Tdap vaccine during each pregnancy. The dose should be obtained regardless of the length of time since the last dose. Immunization is preferred during the 27th-36th week of gestation. An adult who has not previously received Tdap or who does not know her vaccine status should receive 1 dose of Tdap. This initial dose should be followed by tetanus and diphtheria toxoids (Td) booster doses every 10 years. Adults with an unknown or incomplete history of completing a 3-dose immunization series with Td-containing vaccines should begin or complete a primary immunization series including a Tdap dose. Adults should receive a Td booster every 10 years.  Varicella vaccine. An adult without evidence of immunity to varicella should receive 2 doses or a second dose if she has previously received 1 dose. Pregnant females who do not have evidence of immunity should receive the first dose after pregnancy. This first dose should be obtained before leaving the health care facility. The second dose should be obtained 4-8 weeks after the first dose.  Human papillomavirus (HPV) vaccine. Females aged 13-26 years who have not received the vaccine previously should obtain the 3-dose series. The vaccine is not recommended for use in pregnant  females. However, pregnancy testing is not needed before receiving a dose. If a female is found to be pregnant after receiving a dose, no treatment is needed. In that case, the remaining doses should be delayed until after the pregnancy. Immunization is recommended for any person with an immunocompromised condition through the age of 44 years if she did not get any or all  doses earlier. During the 3-dose series, the second dose should be obtained 4-8 weeks after the first dose. The third dose should be obtained 24 weeks after the first dose and 16 weeks after the second dose.  Zoster vaccine. One dose is recommended for adults aged 50 years or older unless certain conditions are present.  Measles, mumps, and rubella (MMR) vaccine. Adults born before [birth year] generally are considered immune to measles and mumps. Adults born in [birth year] or later should have 1 or more doses of MMR vaccine unless there is a contraindication to the vaccine or there is laboratory evidence of immunity to each of the three diseases. A routine second dose of MMR vaccine should be obtained at least 28 days after the first dose for students attending postsecondary schools, health care workers, or international travelers. People who received inactivated measles vaccine or an unknown type of measles vaccine during 1963-1967 should receive 2 doses of MMR vaccine. People who received inactivated mumps vaccine or an unknown type of mumps vaccine before 1979 and are at high risk for mumps infection should consider immunization with 2 doses of MMR vaccine. For females of childbearing age, rubella immunity should be determined. If there is no evidence of immunity, females who are not pregnant should be vaccinated. If there is no evidence of immunity, females who are pregnant should delay immunization until after pregnancy. Unvaccinated health care workers born before [birth year] who lack laboratory evidence of measles, mumps, or rubella immunity or laboratory  confirmation of disease should consider measles and mumps immunization with 2 doses of MMR vaccine or rubella immunization with 1 dose of MMR vaccine.  Pneumococcal 13-valent conjugate (PCV13) vaccine. When indicated, a person who is uncertain of his immunization history and has no record of immunization should receive the PCV13 vaccine. All adults 49 years of age and older should receive this vaccine. An adult aged 84 years or older who has certain medical conditions and has not been previously immunized should receive 1 dose of PCV13 vaccine. This PCV13 should be followed with a dose of pneumococcal polysaccharide (PPSV23) vaccine. Adults who are at high risk for pneumococcal disease should obtain the PPSV23 vaccine at least 8 weeks after the dose of PCV13 vaccine. Adults older than 75 years of age who have normal immune system function should obtain the PPSV23 vaccine dose at least 1 year after the dose of PCV13 vaccine.  Pneumococcal polysaccharide (PPSV23) vaccine. When PCV13 is also indicated, PCV13 should be obtained first. All adults aged 17 years and older should be immunized. An adult younger than age 32 years who has certain medical conditions should be immunized. Any person who resides in a nursing home or long-term care facility should be immunized. An adult smoker should be immunized. People with an immunocompromised condition and certain other conditions should receive both PCV13 and PPSV23 vaccines. People with human immunodeficiency virus (HIV) infection should be immunized as soon as possible after diagnosis. Immunization during chemotherapy or radiation therapy should be avoided. Routine use of PPSV23 vaccine is not recommended for American Indians, [place] Natives, or people younger than 65 years unless there are medical conditions that require PPSV23 vaccine. When indicated, people who have unknown immunization and have no record of immunization should receive PPSV23 vaccine. One-time  revaccination 5 years after the first dose of PPSV23 is recommended for people aged 19-64 years who have chronic kidney failure, nephrotic syndrome, asplenia, or immunocompromised conditions. People who received 1-2 doses of PPSV23 before age 52 years should receive  another dose of PPSV23 vaccine at age 1 years or later if at least 5 years have passed since the previous dose. Doses of PPSV23 are not needed for people immunized with PPSV23 at or after age 23 years.  Meningococcal vaccine. Adults with asplenia or persistent complement component deficiencies should receive 2 doses of quadrivalent meningococcal conjugate (MenACWY-D) vaccine. The doses should be obtained at least 2 months apart. Microbiologists working with certain meningococcal bacteria, [place] recruits, people at risk during an outbreak, and people who travel to or live in countries with a high rate of meningitis should be immunized. A first-year college student up through age 66 years who is living in a residence hall should receive a dose if she did not receive a dose on or after her 16th birthday. Adults who have certain high-risk conditions should receive one or more doses of vaccine.  Hepatitis A vaccine. Adults who wish to be protected from this disease, have certain high-risk conditions, work with hepatitis A-infected animals, work in hepatitis A research labs, or travel to or work in countries with a high rate of hepatitis A should be immunized. Adults who were previously unvaccinated and who anticipate close contact with an international adoptee during the first 60 days after arrival in the [place] from a country with a high rate of hepatitis A should be immunized.  Hepatitis B vaccine. Adults who wish to be protected from this disease, have certain high-risk conditions, may be exposed to blood or other infectious body fluids, are household contacts or sex partners of hepatitis B positive people, are clients or workers in  certain care facilities, or travel to or work in countries with a high rate of hepatitis B should be immunized.  Haemophilus influenzae type b (Hib) vaccine. A previously unvaccinated person with asplenia or sickle cell disease or having a scheduled splenectomy should receive 1 dose of Hib vaccine. Regardless of previous immunization, a recipient of a hematopoietic stem cell transplant should receive a 3-dose series 6-12 months after her successful transplant. Hib vaccine is not recommended for adults with HIV infection. Preventive Services / Frequency Ages 1 to 70 years  Blood pressure check.** / Every 3-5 years.  Lipid and cholesterol check.** / Every 5 years beginning at age 24.  Clinical breast exam.** / Every 3 years for women in their 52s and 78s.  BRCA-related cancer risk assessment.** / For women who have family members with a BRCA-related cancer (breast, ovarian, tubal, or peritoneal cancers).  Pap test.** / Every 2 years from ages 51 through 84. Every 3 years starting at age 40 through age 80 or 64 with a history of 3 consecutive normal Pap tests.  HPV screening.** / Every 3 years from ages 54 through ages 59 to 19 with a history of 3 consecutive normal Pap tests.  Hepatitis C blood test.** / For any individual with known risks for hepatitis C.  Skin self-exam. / Monthly.  Influenza vaccine. / Every year.  Tetanus, diphtheria, and acellular pertussis (Tdap, Td) vaccine.** / Consult your health care provider. Pregnant women should receive 1 dose of Tdap vaccine during each pregnancy. 1 dose of Td every 10 years.  Varicella vaccine.** / Consult your health care provider. Pregnant females who do not have evidence of immunity should receive the first dose after pregnancy.  HPV vaccine. / 3 doses over 6 months, if 66 and younger. The vaccine is not recommended for use in pregnant females. However, pregnancy testing is not needed before receiving a dose.  Measles, mumps, rubella  (MMR) vaccine.** / You need at least 1 dose of MMR if you were born in 1957 or later. You may also need a 2nd dose. For females of childbearing age, rubella immunity should be determined. If there is no evidence of immunity, females who are not pregnant should be vaccinated. If there is no evidence of immunity, females who are pregnant should delay immunization until after pregnancy.  Pneumococcal 13-valent conjugate (PCV13) vaccine.** / Consult your health care provider.  Pneumococcal polysaccharide (PPSV23) vaccine.** / 1 to 2 doses if you smoke cigarettes or if you have certain conditions.  Meningococcal vaccine.** / 1 dose if you are age 47 to 13 years and a Market researcher living in a residence hall, or have one of several medical conditions, you need to get vaccinated against meningococcal disease. You may also need additional booster doses.  Hepatitis A vaccine.** / Consult your health care provider.  Hepatitis B vaccine.** / Consult your health care provider.  Haemophilus influenzae type b (Hib) vaccine.** / Consult your health care provider. Ages 65 to 76 years  Blood pressure check.** / Every year.  Lipid and cholesterol check.** / Every 5 years beginning at age 60 years.  Lung cancer screening. / Every year if you are aged 61-80 years and have a 30-pack-year history of smoking and currently smoke or have quit within the past 15 years. Yearly screening is stopped once you have quit smoking for at least 15 years or develop a health problem that would prevent you from having lung cancer treatment.  Clinical breast exam.** / Every year after age 26 years.  BRCA-related cancer risk assessment.** / For women who have family members with a BRCA-related cancer (breast, ovarian, tubal, or peritoneal cancers).  Mammogram.** / Every year beginning at age 67 years and continuing for as long as you are in good health. Consult with your health care provider.  Pap test.** / Every 3  years starting at age 43 years through age 75 or 48 years with a history of 3 consecutive normal Pap tests.  HPV screening.** / Every 3 years from ages 82 years through ages 6 to 48 years with a history of 3 consecutive normal Pap tests.  Fecal occult blood test (FOBT) of stool. / Every year beginning at age 74 years and continuing until age 50 years. You may not need to do this test if you get a colonoscopy every 10 years.  Flexible sigmoidoscopy or colonoscopy.** / Every 5 years for a flexible sigmoidoscopy or every 10 years for a colonoscopy beginning at age 43 years and continuing until age 84 years.  Hepatitis C blood test.** / For all people born from [birth year] through 1965 and any individual with known risks for hepatitis C.  Skin self-exam. / Monthly.  Influenza vaccine. / Every year.  Tetanus, diphtheria, and acellular pertussis (Tdap/Td) vaccine.** / Consult your health care provider. Pregnant women should receive 1 dose of Tdap vaccine during each pregnancy. 1 dose of Td every 10 years.  Varicella vaccine.** / Consult your health care provider. Pregnant females who do not have evidence of immunity should receive the first dose after pregnancy.  Zoster vaccine.** / 1 dose for adults aged 10 years or older.  Measles, mumps, rubella (MMR) vaccine.** / You need at least 1 dose of MMR if you were born in 1957 or later. You may also need a second dose. For females of childbearing age, rubella immunity should be determined. If there is no  evidence of immunity, females who are not pregnant should be vaccinated. If there is no evidence of immunity, females who are pregnant should delay immunization until after pregnancy.  Pneumococcal 13-valent conjugate (PCV13) vaccine.** / Consult your health care provider.  Pneumococcal polysaccharide (PPSV23) vaccine.** / 1 to 2 doses if you smoke cigarettes or if you have certain conditions.  Meningococcal vaccine.** / Consult your health care  provider.  Hepatitis A vaccine.** / Consult your health care provider.  Hepatitis B vaccine.** / Consult your health care provider.  Haemophilus influenzae type b (Hib) vaccine.** / Consult your health care provider. Ages 23 years and over  Blood pressure check.** / Every year.  Lipid and cholesterol check.** / Every 5 years beginning at age 58 years.  Lung cancer screening. / Every year if you are aged 37-80 years and have a 30-pack-year history of smoking and currently smoke or have quit within the past 15 years. Yearly screening is stopped once you have quit smoking for at least 15 years or develop a health problem that would prevent you from having lung cancer treatment.  Clinical breast exam.** / Every year after age 32 years.  BRCA-related cancer risk assessment.** / For women who have family members with a BRCA-related cancer (breast, ovarian, tubal, or peritoneal cancers).  Mammogram.** / Every year beginning at age 25 years and continuing for as long as you are in good health. Consult with your health care provider.  Pap test.** / Every 3 years starting at age 71 years through age 61 or 73 years with 3 consecutive normal Pap tests. Testing can be stopped between 65 and 70 years with 3 consecutive normal Pap tests and no abnormal Pap or HPV tests in the past 10 years.  HPV screening.** / Every 3 years from ages 40 years through ages 70 or 62 years with a history of 3 consecutive normal Pap tests. Testing can be stopped between 65 and 70 years with 3 consecutive normal Pap tests and no abnormal Pap or HPV tests in the past 10 years.  Fecal occult blood test (FOBT) of stool. / Every year beginning at age 61 years and continuing until age 2 years. You may not need to do this test if you get a colonoscopy every 10 years.  Flexible sigmoidoscopy or colonoscopy.** / Every 5 years for a flexible sigmoidoscopy or every 10 years for a colonoscopy beginning at age 83 years and continuing  until age 48 years.  Hepatitis C blood test.** / For all people born from [birth year] through 1965 and any individual with known risks for hepatitis C.  Osteoporosis screening.** / A one-time screening for women ages 79 years and over and women at risk for fractures or osteoporosis.  Skin self-exam. / Monthly.  Influenza vaccine. / Every year.  Tetanus, diphtheria, and acellular pertussis (Tdap/Td) vaccine.** / 1 dose of Td every 10 years.  Varicella vaccine.** / Consult your health care provider.  Zoster vaccine.** / 1 dose for adults aged 18 years or older.  Pneumococcal 13-valent conjugate (PCV13) vaccine.** / Consult your health care provider.  Pneumococcal polysaccharide (PPSV23) vaccine.** / 1 dose for all adults aged 57 years and older.  Meningococcal vaccine.** / Consult your health care provider.  Hepatitis A vaccine.** / Consult your health care provider.  Hepatitis B vaccine.** / Consult your health care provider.  Haemophilus influenzae type b (Hib) vaccine.** / Consult your health care provider. ** Family history and personal history of risk and conditions may change your health  care provider's recommendations.   This information is not intended to replace advice given to you by your health care provider. Make sure you discuss any questions you have with your health care provider.   Document Released: 05/19/2001 Document Revised: 04/13/2014 Document Reviewed: 08/18/2010 Elsevier Interactive Patient Education [employer].

## 2015-06-27 NOTE — Assessment & Plan Note (Signed)
Well controlled, no changes to meds. Encouraged heart healthy diet such as the DASH diet and exercise as tolerated.  °

## 2015-06-27 NOTE — Assessment & Plan Note (Signed)
Encouraged DASH diet, decrease po intake and increase exercise as tolerated. Needs 7-8 hours of sleep nightly. Avoid trans fats, eat small, frequent meals every 4-5 hours with lean proteins, complex carbs and healthy fats. Minimize simple carbs 

## 2015-06-27 NOTE — Progress Notes (Signed)
Patient ID: Karen Hoffman, female   DOB: 08-Jan-1941, 75 y.o.   MRN: [id number]   Subjective:    Patient ID: Karen Hoffman, female    DOB: 01-25-1941, 75 y.o.   MRN: [id number]  Chief Complaint  Patient presents with  . Annual Exam    HPI Patient is in today for annual exam and follow up on numerous concerns. She feels well today but acknowledges some intermittent trouble with allergies and asthma. No recent hospitalizations. Is doing well with ADLs at home and tries to maintain a heart healthy diet. Denies CP/palp/SOB/HA/congestion/fevers/GI or GU c/o. Taking meds as prescribed  Past Medical History  Diagnosis Date  . 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 Cataracts, bilateral   . Glaucoma   . Elevated BP   . Osteoporosis   . Carcinoma in situ of skin of back   . Allergy     improved since move to [place]  . Asthma     mild, intermittent, improved since moving to [place]  . Overweight(278.02) 07/02/2010  . Thyroid disease 07/02/2010  . History of chicken pox 07/02/2010  . History of measles 07/02/2010  . Elevated LFTs 07/02/2010  . HTN (hypertension) 08/06/2010  . Hyperlipidemia 09/04/2010  . URI (upper respiratory infection) 11/17/2011  . Hypokalemia 11/18/2011  . Cervical cancer screening 06/19/2013  . Medicare annual wellness visit, subsequent 04/21/2011  . Vitamin D deficiency 06/27/2015    Past Surgical History  Procedure Laterality Date  . Broken ankle  1997    right, screws and plates in place  . Tonsillectomy    . Left eye surgery    . Trunk skin lesion excisional biopsy      x twice    Family History  Problem Relation Age of Onset  . Cancer Mother     lung/ smoker  . Heart attack Paternal Grandmother   . Heart disease Father   . Hypertension Father   . Kidney disease Father 65    dialysis  . Obesity Father   . Diabetes type II Father   . Graves' disease Daughter   . Hypothyroidism Daughter     h/o Hashimoto's now hypothyroid  . Cholecystitis Son 55   cholecystectomy    Social History   Social History  . Marital Status: Widowed    Spouse Name: N/A  . Number of Children: N/A  . Years of Education: N/A   Occupational History  . Not on file.   Social History Main Topics  . Smoking status: Former Smoker -- 0.20 packs/day for 10 years    Types: Cigarettes    Quit date: 04/07/1975  . Smokeless tobacco: Never Used  . Alcohol Use: Yes     Comment: 1 glass of wine or coctail with dinner  . Drug Use: No  . Sexual Activity: No     Comment: lives with dog, no major dietary restrictions, wears seat  belt   Other Topics Concern  . Not on file   Social History Narrative    Outpatient Prescriptions Prior to Visit  Medication Sig Dispense Refill  . Albuterol Sulfate 108 (90 BASE) MCG/ACT AEPB Inhale 2 puffs into the lungs as needed.    . Calcium Citrate-Vitamin D (CALCIUM CITRATE + PO) Take 1 tablet by mouth daily.    . chlorthalidone (HYGROTON) 25 MG tablet TAKE 1 TABLET (25 MG TOTAL) BY MOUTH DAILY. 30 tablet 6  . dorzolamide-timolol (COSOPT) 22.3-6.8 MG/ML ophthalmic solution Place 1 drop  into both eyes 2 (two) times daily.   5  . Flaxseed, Linseed, (FLAXSEED OIL) 1000 MG CAPS Take 1 capsule by mouth daily.    Marland Kitchen KLOR-CON M20 20 MEQ tablet TAKE 1 TABLET (20 MEQ TOTAL) BY MOUTH 2 (TWO) TIMES DAILY. 60 tablet 0  . latanoprost (XALATAN) 0.005 % ophthalmic solution Place 1 drop into both eyes at bedtime.   5  . loratadine (CLARITIN) 10 MG tablet Take 10 mg by mouth daily.    Marland Kitchen losartan (COZAAR) 25 MG tablet TAKE 1 TABLET BY MOUTH EVERY DAY 30 tablet 6  . milk thistle 175 MG tablet Take 250 mg by mouth daily.    . Multiple Vitamins-Minerals (CENTRUM ADULTS PO) Take 1 tablet by mouth daily.    . Probiotic Product (ALIGN PO) Take 1 capsule by mouth daily.    . timolol (TIMOPTIC) 0.5 % ophthalmic solution 1 drop 2 (two) times daily.    . vitamin C (ASCORBIC ACID) 500 MG tablet Take 500 mg by mouth daily.    . fluticasone (FLONASE) 50  MCG/ACT nasal spray Place 2 sprays into both nostrils daily. (Patient not taking: Reported on 06/27/2015) 16 g 1  . PROAIR HFA 108 (90 BASE) MCG/ACT inhaler INHALE 2 PUFFS INTO THE LUNGS EVERY 6 HOURS AS NEEDED FOR WHEEZING OR SHORTNESS OF BREATH (Patient not taking: Reported on 06/27/2015) 8.5 Inhaler 4  . azithromycin (ZITHROMAX) 250 MG tablet Take 2 tablets by mouth on day 1, followed by 1 tablet by mouth daily for 4 days. 6 tablet 0  . benzonatate (TESSALON) 100 MG capsule Take 1 capsule (100 mg total) by mouth 3 (three) times daily as needed. 21 capsule 0  . dextromethorphan-guaiFENesin (MUCINEX DM) 30-600 MG per 12 hr tablet Take 1 tablet by mouth 2 (two) times daily.    Marland Kitchen HYDROcodone-homatropine (HYCODAN) 5-1.5 MG/5ML syrup Take 5 mLs by mouth every 8 (eight) hours as needed for cough. 120 mL 0  . KLOR-CON M20 20 MEQ tablet TAKE 1 TABLET BY MOUTH EVERY DAY 30 tablet 2  . losartan (COZAAR) 25 MG tablet Take 25 mg by mouth daily.  5  . predniSONE (DELTASONE) 20 MG tablet Take 2 tablets (40 mg total) by mouth daily with breakfast. 10 tablet 0   No facility-administered medications prior to visit.    Allergies  Allergen Reactions  . Ace Inhibitors Cough    Review of Systems  Constitutional: Negative for fever, chills and malaise/fatigue.  HENT: Negative for congestion and hearing loss.   Eyes: Negative for discharge.  Respiratory: Negative for cough, sputum production and shortness of breath.   Cardiovascular: Negative for chest pain, palpitations and leg swelling.  Gastrointestinal: Negative for heartburn, nausea, vomiting, abdominal pain, diarrhea, constipation and blood in stool.  Genitourinary: Negative for dysuria, urgency, frequency and hematuria.  Musculoskeletal: Negative for myalgias, back pain and falls.  Skin: Negative for rash.  Neurological: Negative for dizziness, sensory change, loss of consciousness, weakness and headaches.  Endo/Heme/Allergies: Negative for  environmental allergies. Does not bruise/bleed easily.  Psychiatric/Behavioral: Negative for depression and suicidal ideas. The patient is not nervous/anxious and does not have insomnia.        Objective:    Physical Exam  Constitutional: She is oriented to person, place, and time. She appears well-developed and well-nourished. No distress.  HENT:  Head: Normocephalic and atraumatic.  Right Ear: External ear normal.  Left Ear: External ear normal.  Nose: Nose normal.  Mouth/Throat: Oropharynx is clear and moist.  Eyes: Conjunctivae  and EOM are normal. Pupils are equal, round, and reactive to light. Right eye exhibits no discharge. Left eye exhibits no discharge.  Neck: Normal range of motion. Neck supple. No JVD present. No thyromegaly present.  Cardiovascular: Normal rate, regular rhythm, normal heart sounds and intact distal pulses.   No murmur heard. Pulmonary/Chest: Effort normal and breath sounds normal. No respiratory distress. She has no wheezes. She has no rales. She exhibits no tenderness.  Abdominal: Soft. Bowel sounds are normal. She exhibits no distension and no mass. There is no tenderness. There is no rebound and no guarding.  Genitourinary: Vagina normal and uterus normal. Guaiac negative stool. No vaginal discharge found.  Musculoskeletal: Normal range of motion. She exhibits no edema or tenderness.  Lymphadenopathy:    She has no cervical adenopathy.  Neurological: She is alert and oriented to person, place, and time. She has normal reflexes. No cranial nerve deficit.  Skin: Skin is warm and dry. No rash noted. She is not diaphoretic. No erythema.  Psychiatric: She has a normal mood and affect. Her behavior is normal. Judgment and thought content normal.  Nursing note and vitals reviewed.   BP 120/82 mmHg  Pulse 62  Temp(Src) 97.9 F (36.6 C) (Oral)  Ht 5' 5" (1.651 m)  Wt 174 lb (78.926 kg)  BMI 28.96 kg/m2  SpO2 92% Wt Readings from Last 3 Encounters:    06/27/15 174 lb (78.926 kg)  03/07/15 178 lb (80.74 kg)  12/04/14 176 lb (79.833 kg)     Lab Results  Component Value Date   WBC 8.9 06/20/2015   HGB 14.8 06/20/2015   HCT 43.9 06/20/2015   PLT 239.0 06/20/2015   GLUCOSE 100* 06/27/2015   CHOL 198 06/20/2015   TRIG 150.0* 06/20/2015   HDL 49.30 06/20/2015   LDLDIRECT 131.6 12/12/2012   LDLCALC 119* 06/20/2015   ALT 24 06/27/2015   AST 21 06/27/2015   NA 139 06/27/2015   K 3.3* 06/27/2015   CL 100 06/27/2015   CREATININE 0.79 06/27/2015   BUN 19 06/27/2015   CO2 32 06/27/2015   TSH 2.26 06/20/2015    Lab Results  Component Value Date   TSH 2.26 06/20/2015   Lab Results  Component Value Date   WBC 8.9 06/20/2015   HGB 14.8 06/20/2015   HCT 43.9 06/20/2015   MCV 89.1 06/20/2015   PLT 239.0 06/20/2015   Lab Results  Component Value Date   NA 139 06/27/2015   K 3.3* 06/27/2015   CO2 32 06/27/2015   GLUCOSE 100* 06/27/2015   BUN 19 06/27/2015   CREATININE 0.79 06/27/2015   BILITOT 0.8 06/27/2015   ALKPHOS 83 06/27/2015   AST 21 06/27/2015   ALT 24 06/27/2015   PROT 7.0 06/27/2015   ALBUMIN 4.0 06/27/2015   CALCIUM 9.8 06/27/2015   GFR 75.47 06/27/2015   Lab Results  Component Value Date   CHOL 198 06/20/2015   Lab Results  Component Value Date   HDL 49.30 06/20/2015   Lab Results  Component Value Date   LDLCALC 119* 06/20/2015   Lab Results  Component Value Date   TRIG 150.0* 06/20/2015   Lab Results  Component Value Date   CHOLHDL 4 06/20/2015   No results found for: HGBA1C     Assessment & Plan:   Problem List Items Addressed This Visit    Cataracts, bilateral    Planning right cataract removal in fall with [facility], Dr Arlina Robes      HTN (  hypertension)    Well controlled, no changes to meds. Encouraged heart healthy diet such as the DASH diet and exercise as tolerated.       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 Was initially above 5 and is now down to 2. 3 ish. She is tolerating treatment well except for lost some eye lashes and her eyes are dry. Planning cataract surgery in the fall in her right eye      Medicare annual wellness visit, subsequent    Patient denies any difficulties at home. No trouble with ADLs, depression or falls. See EMR for functional status screen and depression screen. No recent changes to vision or hearing. Is UTD with immunizations. Is UTD with screening. Discussed Advanced Directives. Encouraged heart healthy diet, exercise as tolerated and adequate sleep. See patient's problem list for health risk factors to monitor. See AVS for preventative healthcare recommendation schedule. Preferred Pharmacy and which med where:Local 90 day supply/mail order: na Local pharmacy: [facility]  Allergies verified:UTD  Immunization Status: Prompted for insurance verification: na Flu vaccine--9.10.15 Tdap--1.15.2013 PNA--(23) 1.12007 (13) 3.16.2015 Shingles--8.1.2013  A/P:  Changes to FH, PSH or Personal Hx:UTD Pap--na MMG--8.13.2014--neg--q 2 years Bone Density--not on file CCS--not on file  Care Teams Updated:Updated ED/Hospital/Urgent Care Visits:none Prompted for: Updated insurance, contact information, forms:UTD Remind to bring: DPR information, advance directives: has Living will and POA      Osteoporosis    vitamin D 1000 to 2000 IU daily, continue daily exercise. Encouraged to get adequate exercise, calcium and vitamin d intake      Overweight - Primary    Encouraged DASH diet, decrease po intake and increase exercise as tolerated. Needs 7-8 hours of sleep nightly. Avoid trans fats, eat small, frequent meals every 4-5 hours with lean proteins, complex carbs and healthy fats. Minimize simple  carbs.      Vitamin D deficiency    Encouraged supplementation       Other Visit Diagnoses    Need for 23-polyvalent pneumococcal polysaccharide vaccine        Relevant Orders    Pneumococcal polysaccharide vaccine 23-valent greater than or equal to 2yo subcutaneous/IM (Completed)       I have discontinued Ms. Guirguis's dextromethorphan-guaiFENesin, HYDROcodone-homatropine, predniSONE, azithromycin, and benzonatate. I am also having her maintain her dorzolamide-timolol, latanoprost, Albuterol Sulfate, vitamin C, Calcium Citrate-Vitamin D (CALCIUM CITRATE + PO), Flaxseed Oil, milk thistle, Multiple Vitamins-Minerals (CENTRUM ADULTS PO), Probiotic Product (ALIGN PO), timolol, PROAIR HFA, loratadine, fluticasone, KLOR-CON M20, losartan, chlorthalidone, and brimonidine.  Meds ordered this encounter  Medications  . brimonidine (ALPHAGAN) 0.15 % ophthalmic solution    Sig: Place 1 drop into the left eye 3 (three) times daily.     Penni Homans, MD

## 2015-06-27 NOTE — Assessment & Plan Note (Addendum)
vitamin D 1000 to 2000 IU daily, continue daily exercise. Encouraged to get adequate exercise, calcium and vitamin d intake

## 2015-06-27 NOTE — Progress Notes (Signed)
Pre visit review using our clinic review tool, if applicable. No additional management support is needed unless otherwise documented below in the visit note. 

## 2015-06-27 NOTE — Assessment & Plan Note (Signed)
Encouraged supplementation 

## 2015-06-27 NOTE — Assessment & Plan Note (Addendum)
Planning right cataract removal in fall with [facility], Dr Arlina Robes

## 2015-06-28 ENCOUNTER — Other Ambulatory Visit: Payer: Self-pay | Admitting: Family Medicine

## 2015-06-28 DIAGNOSIS — E875 Hyperkalemia: Secondary | ICD-10-CM

## 2015-06-28 DIAGNOSIS — E876 Hypokalemia: Secondary | ICD-10-CM

## 2015-07-22 ENCOUNTER — Other Ambulatory Visit: Payer: Self-pay | Admitting: Family Medicine

## 2015-07-22 ENCOUNTER — Other Ambulatory Visit: Payer: Self-pay | Admitting: Medical

## 2015-08-01 ENCOUNTER — Other Ambulatory Visit (INDEPENDENT_AMBULATORY_CARE_PROVIDER_SITE_OTHER): Payer: Medicare Other

## 2015-08-01 DIAGNOSIS — E876 Hypokalemia: Secondary | ICD-10-CM

## 2015-08-01 LAB — COMPREHENSIVE METABOLIC PANEL
ALK PHOS: 78 U/L (ref 39–117)
ALT: 23 U/L (ref 0–35)
AST: 20 U/L (ref 0–37)
Albumin: 4.1 g/dL (ref 3.5–5.2)
BUN: 14 mg/dL (ref 6–23)
CO2: 33 mEq/L — ABNORMAL HIGH (ref 19–32)
Calcium: 9.3 mg/dL (ref 8.4–10.5)
Chloride: 101 mEq/L (ref 96–112)
Creatinine, Ser: 0.84 mg/dL (ref 0.40–1.20)
GFR: 70.29 mL/min (ref >60.00)
GLUCOSE: 98 mg/dL (ref 70–99)
POTASSIUM: 3.8 meq/L (ref 3.5–5.1)
Sodium: 139 mEq/L (ref 135–145)
TOTAL PROTEIN: 6.8 g/dL (ref 6.0–8.3)
Total Bilirubin: 0.7 mg/dL (ref 0.2–1.2)

## 2015-09-09 ENCOUNTER — Ambulatory Visit (INDEPENDENT_AMBULATORY_CARE_PROVIDER_SITE_OTHER): Payer: Medicare Other | Admitting: Internal Medicine

## 2015-09-09 ENCOUNTER — Encounter: Payer: Self-pay | Admitting: Internal Medicine

## 2015-09-09 VITALS — BP 122/64 | HR 70 | Temp 98.0°F | Ht 65.0 in | Wt 175.4 lb

## 2015-09-09 DIAGNOSIS — H109 Unspecified conjunctivitis: Secondary | ICD-10-CM

## 2015-09-09 DIAGNOSIS — J209 Acute bronchitis, unspecified: Secondary | ICD-10-CM | POA: Diagnosis not present

## 2015-09-09 MED ORDER — CIPROFLOXACIN HCL 0.3 % OP SOLN: 1.0000 [drp] | OPHTHALMIC | Status: DC

## 2015-09-09 MED ORDER — AMOXICILLIN 875 MG PO TABS: 875.0000 mg | ORAL_TABLET | Freq: Two times a day (BID) | ORAL | Status: DC

## 2015-09-09 NOTE — Patient Instructions (Signed)
Use the eyedrops as prescribed  Call immediately your eye doctor if you get worse  If not improving in the next 48 hours--see your eye doctor this week  For bronchitis: Tylenol as needed Mucinex DM twice a day until better Amoxicillin as prescribed  Use albuterol as needed if cough, wheezing or chest congestion Call if not improving in the next few days

## 2015-09-09 NOTE — Progress Notes (Signed)
Subjective:    Patient ID: Karen Hoffman, female    DOB: 08-Jan-1941, 75 y.o.   MRN: [id number]  DOS:  09/09/2015 Type of visit - description : Acute visit Interval history: History of left dry eye due to previous radiation therapy, symptoms started to get worse about 3 days ago with eye irritation, a small amount of discharge was also noted. Her vision is poor on the left eye but unchanged. Additionally has noted some cough for the last 7 days..   Review of Systems  denies fever chills, no sore throat or runny nose. No sinus pain or congestion.  Past Medical History  Diagnosis Date  . 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 Cataracts, bilateral   . Glaucoma   . Elevated BP   . Osteoporosis   . Carcinoma in situ of skin of back   . Allergy     improved since move to [place]  . Asthma     mild, intermittent, improved since moving to [place]  . Overweight(278.02) 07/02/2010  . Thyroid disease 07/02/2010  . History of chicken pox 07/02/2010  . History of measles 07/02/2010  . Elevated LFTs 07/02/2010  . HTN (hypertension) 08/06/2010  . Hyperlipidemia 09/04/2010  . URI (upper respiratory infection) 11/17/2011  . Hypokalemia 11/18/2011  . Cervical cancer screening 06/19/2013  . Medicare annual wellness visit, subsequent 04/21/2011  . Vitamin D deficiency 06/27/2015    Past Surgical History  Procedure Laterality Date  . Broken ankle  1997    right, screws and plates in place  . Tonsillectomy    . Left eye surgery    . Trunk skin lesion excisional biopsy      x twice    Social History   Social History  . Marital Status: Widowed    Spouse Name: N/A  . Number of Children: N/A  . Years of Education: N/A   Occupational History  . Not on file.   Social History Main Topics  . Smoking status: Former Smoker -- 0.20 packs/day for 10 years    Types: Cigarettes    Quit date: 04/07/1975  . Smokeless tobacco: Never Used  . Alcohol Use: Yes     Comment: 1 glass of wine or coctail  with dinner  . Drug Use: No  . Sexual Activity: No     Comment: lives with dog, no major dietary restrictions, wears seat  belt   Other Topics Concern  . Not on file   Social History Narrative        Medication List       This list is accurate as of: 09/09/15  4:37 PM.  Always use your most recent med list.               Albuterol Sulfate 108 (90 Base) MCG/ACT Aepb  Inhale 2 puffs into the lungs as needed.     ALIGN PO  Take 1 capsule by mouth daily.     amoxicillin 875 MG tablet  Commonly known as:  AMOXIL  Take 1 tablet (875 mg total) by mouth 2 (two) times daily.     brimonidine 0.15 % ophthalmic solution  Commonly known as:  ALPHAGAN  Place 1 drop into the left eye 3 (three) times daily.     CALCIUM CITRATE + PO  Take 1 tablet by mouth daily.     CENTRUM ADULTS PO  Take 1 tablet by mouth daily.     chlorthalidone 25 MG  tablet  Commonly known as:  HYGROTON  TAKE 1 TABLET (25 MG TOTAL) BY MOUTH DAILY.     ciprofloxacin 0.3 % ophthalmic solution  Commonly known as:  CILOXAN  Place 1 drop into the left eye every 4 (four) hours while awake. Administer 1 drop, every 2 hours, while awake, for 2 days. Then 1 drop, every 4 hours, while awake, for the next 5 days.     dorzolamide-timolol 22.3-6.8 MG/ML ophthalmic solution  Commonly known as:  COSOPT  Place 1 drop into both eyes 2 (two) times daily.     Flaxseed Oil 1000 MG Caps  Take 1 capsule by mouth daily.     fluticasone 50 MCG/ACT nasal spray  Commonly known as:  FLONASE  SPRAY TWICE INTO BOTH NOSTRILS EVERY DAY     KLOR-CON M20 20 MEQ tablet  Generic drug:  potassium chloride SA  TAKE 1 TABLET (20 MEQ TOTAL) BY MOUTH 2 (TWO) TIMES DAILY.     latanoprost 0.005 % ophthalmic solution  Commonly known as:  XALATAN  Place 1 drop into both eyes at bedtime.     loratadine 10 MG tablet  Commonly known as:  CLARITIN  Take 10 mg by mouth daily.     losartan 25 MG tablet  Commonly known as:  COZAAR  TAKE 1  TABLET BY MOUTH EVERY DAY     milk thistle 175 MG tablet  Take 250 mg by mouth daily.     timolol 0.5 % ophthalmic solution  Commonly known as:  TIMOPTIC  1 drop 2 (two) times daily.     vitamin C 500 MG tablet  Commonly known as:  ASCORBIC ACID  Take 500 mg by mouth daily.           Objective:   Physical Exam BP 122/64 mmHg  Pulse 70  Temp(Src) 98 F (36.7 C) (Oral)  Ht 5\' 5"  (1.651 m)  Wt 175 lb 6 oz (79.55 kg)  BMI 29.18 kg/m2  SpO2 97% General:   Well developed, well nourished . NAD.  HEENT:  Normocephalic . Face symmetric, atraumatic EOMI. Right eye normal Left eye slightly dry, conjunctiva is erythematous, no actual discharge at the time of the exam. Pupils are reactive, slightly less so on the left. Nose not congested. Lungs:  Wheezing bilaterally, few rhonchi Normal respiratory effort, no intercostal retractions, no accessory muscle use. Heart: RRR,  no murmur.  No pretibial edema bilaterally  Skin: Not pale. Not jaundice Neurologic:  alert & oriented X3.  Speech normal, gait appropriate for age and unassisted Psych--  Cognition and judgment appear intact.  Cooperative with normal attention span and concentration.  Behavior appropriate. No anxious or depressed appearing.      Assessment & Plan:   Conjunctivitis, left eye Patient with a history of ocular melanoma s/p XRT  2010 presents w/  left eye redness and some discharge for the last 3 days. In addition to start her on an antibiotic I liked her to see her local eye doctor, patient preference is to wait and see if she gets better with the antibiotic. See instructions. Mild bronchitis. History of asthma, some wheezing. Will treat with Mucinex, albuterol and amoxicillin. Call if no better

## 2015-09-09 NOTE — Progress Notes (Signed)
Pre visit review using our clinic review tool, if applicable. No additional management support is needed unless otherwise documented below in the visit note. 

## 2015-11-20 ENCOUNTER — Other Ambulatory Visit: Payer: Self-pay | Admitting: Family Medicine

## 2015-12-19 ENCOUNTER — Other Ambulatory Visit: Payer: Self-pay | Admitting: Family Medicine

## 2015-12-23 DIAGNOSIS — C6932 Malignant neoplasm of left choroid: Secondary | ICD-10-CM | POA: Diagnosis not present

## 2016-01-09 DIAGNOSIS — C6932 Malignant neoplasm of left choroid: Secondary | ICD-10-CM | POA: Diagnosis not present

## 2016-01-09 DIAGNOSIS — H52203 Unspecified astigmatism, bilateral: Secondary | ICD-10-CM | POA: Diagnosis not present

## 2016-01-09 DIAGNOSIS — H401123 Primary open-angle glaucoma, left eye, severe stage: Secondary | ICD-10-CM | POA: Diagnosis not present

## 2016-01-13 ENCOUNTER — Ambulatory Visit (HOSPITAL_BASED_OUTPATIENT_CLINIC_OR_DEPARTMENT_OTHER)
Admission: RE | Admit: 2016-01-13 | Discharge: 2016-01-13 | Disposition: A | Discharge status: Home / Self Care | Payer: Medicare Other | Source: Ambulatory Visit | Attending: Medical | Admitting: Medical

## 2016-01-13 ENCOUNTER — Encounter: Payer: Self-pay | Admitting: Medical

## 2016-01-13 ENCOUNTER — Ambulatory Visit (INDEPENDENT_AMBULATORY_CARE_PROVIDER_SITE_OTHER): Payer: Medicare Other | Admitting: Medical

## 2016-01-13 VITALS — BP 150/70 | HR 60 | Temp 97.9°F | Wt 178.0 lb

## 2016-01-13 DIAGNOSIS — R05 Cough: Secondary | ICD-10-CM | POA: Insufficient documentation

## 2016-01-13 DIAGNOSIS — R918 Other nonspecific abnormal finding of lung field: Secondary | ICD-10-CM | POA: Diagnosis not present

## 2016-01-13 DIAGNOSIS — R062 Wheezing: Secondary | ICD-10-CM | POA: Insufficient documentation

## 2016-01-13 DIAGNOSIS — J209 Acute bronchitis, unspecified: Secondary | ICD-10-CM | POA: Diagnosis not present

## 2016-01-13 DIAGNOSIS — R059 Cough, unspecified: Secondary | ICD-10-CM

## 2016-01-13 DIAGNOSIS — I700 Atherosclerosis of aorta: Secondary | ICD-10-CM | POA: Insufficient documentation

## 2016-01-13 MED ORDER — PREDNISONE 10 MG PO TABS: ORAL_TABLET | ORAL | Status: DC

## 2016-01-13 MED ORDER — BENZONATATE 100 MG PO CAPS: 100.0000 mg | ORAL_CAPSULE | Freq: Three times a day (TID) | ORAL | 0 refills | Status: DC | PRN

## 2016-01-13 MED ORDER — AZITHROMYCIN 250 MG PO TABS: ORAL_TABLET | ORAL | 0 refills | Status: DC

## 2016-01-13 NOTE — Progress Notes (Signed)
Subjective:    Patient ID: Karen Hoffman, female    DOB: 1940-04-21, 75 y.o.   MRN: [id number]  HPI   Pt is in for evaluation of for possible cataract surgery for Monday upcoming week.  Pt will have iv sedation procedure. Pt had preop visit today and they noted some wheezing on exam. Pt had some mild productive cough for last 2 weeks.  Eye MD office wanted her evaluated here before finalizing the surgery date.  Pt has remote history of smoking 36 years. Pt was only pack a week back then.  Hx of bronchitis and occasional asthma flare since moving to [place] from [place].  Pt has cataract and glaucoma surgery.  Review of Systems  Constitutional: Negative for chills, fatigue and fever.  Respiratory: Positive for cough and wheezing. Negative for chest tightness and shortness of breath.   Cardiovascular: Negative for chest pain and palpitations.  Gastrointestinal: Negative for abdominal pain.  Musculoskeletal: Negative for back pain and myalgias.  Hematological: Negative for adenopathy. Does not bruise/bleed easily.  Psychiatric/Behavioral: Negative for behavioral problems and confusion.   Past Medical History:  Diagnosis Date  . Allergy    improved since move to [place]  . Asthma    mild, intermittent, improved since moving to [place]  . Carcinoma in situ of skin of back   . Cataracts, bilateral   . Cervical cancer screening 06/19/2013  . Elevated BP   . Elevated LFTs 07/02/2010  . Glaucoma   . History of chicken pox 07/02/2010  . History of measles 07/02/2010  . HTN (hypertension) 08/06/2010  . Hyperlipidemia 09/04/2010  . Hypokalemia 11/18/2011  . Malignant melanoma of eye ([place])    left   . Medicare annual wellness visit, subsequent 04/21/2011  . Osteoporosis   . Overweight(278.02) 07/02/2010  . Thyroid disease 07/02/2010  . URI (upper respiratory infection) 11/17/2011  . Vitamin D deficiency 06/27/2015     Social History   Social History  . Marital status: Widowed    Spouse name: N/A    . Number of children: N/A  . Years of education: N/A   Occupational History  . Not on file.   Social History Main Topics  . Smoking status: Former Smoker    Packs/day: 0.20    Years: 10.00    Types: Cigarettes    Quit date: 04/07/1975  . Smokeless tobacco: Never Used  . Alcohol use Yes     Comment: 1 glass of wine or coctail with dinner  . Drug use: No  . Sexual activity: No     Comment: lives with dog, no major dietary restrictions, wears seat  belt   Other Topics Concern  . Not on file   Social History Narrative  . No narrative on file    Past Surgical History:  Procedure Laterality Date  . broken ankle  1997   right, screws and plates in place  . left eye surgery    . TONSILLECTOMY    . TRUNK SKIN LESION EXCISIONAL BIOPSY     x twice    Family History  Problem Relation Age of Onset  . Cancer Mother     lung/ smoker  . Heart attack Paternal Grandmother   . Heart disease Father   . Hypertension Father   . Kidney disease Father 76    dialysis  . Obesity Father   . Diabetes type II Father   . Graves' disease Daughter   . Hypothyroidism Daughter     h/o Hashimoto's  now hypothyroid  . Cholecystitis Son 42    cholecystectomy    Allergies  Allergen Reactions  . Ace Inhibitors Cough    Current Outpatient Prescriptions on File Prior to Visit  Medication Sig Dispense Refill  . Albuterol Sulfate 108 (90 BASE) MCG/ACT AEPB Inhale 2 puffs into the lungs as needed.    . brimonidine (ALPHAGAN) 0.15 % ophthalmic solution Place 1 drop into the left eye 3 (three) times daily.    . Calcium Citrate-Vitamin D (CALCIUM CITRATE + PO) Take 1 tablet by mouth daily.    . chlorthalidone (HYGROTON) 25 MG tablet TAKE 1 TABLET (25 MG TOTAL) BY MOUTH DAILY. 30 tablet 6  . dorzolamide-timolol (COSOPT) 22.3-6.8 MG/ML ophthalmic solution Place 1 drop into both eyes 2 (two) times daily.   5  . Flaxseed, Linseed, (FLAXSEED OIL) 1000 MG CAPS Take 1 capsule by mouth daily.    .  fluticasone (FLONASE) 50 MCG/ACT nasal spray SPRAY TWICE INTO BOTH NOSTRILS EVERY DAY 16 g 1  . KLOR-CON M20 20 MEQ tablet TAKE 1 TABLET (20 MEQ TOTAL) BY MOUTH 2 (TWO) TIMES DAILY. 60 tablet 3  . latanoprost (XALATAN) 0.005 % ophthalmic solution Place 1 drop into both eyes at bedtime.   5  . loratadine (CLARITIN) 10 MG tablet Take 10 mg by mouth daily.    Marland Kitchen losartan (COZAAR) 25 MG tablet TAKE 1 TABLET BY MOUTH EVERY DAY 30 tablet 6  . milk thistle 175 MG tablet Take 250 mg by mouth daily.    . Multiple Vitamins-Minerals (CENTRUM ADULTS PO) Take 1 tablet by mouth daily.    Marland Kitchen PROAIR HFA 108 (90 Base) MCG/ACT inhaler INHALE 2 PUFFS INTO THE LUNGS EVERY 6 HOURS AS NEEDED FOR WHEEZING OR SHORTNESS OF BREATH 8.5 Inhaler 3  . Probiotic Product (ALIGN PO) Take 1 capsule by mouth daily.    . timolol (TIMOPTIC) 0.5 % ophthalmic solution 1 drop 2 (two) times daily.    . vitamin C (ASCORBIC ACID) 500 MG tablet Take 500 mg by mouth daily.    Marland Kitchen amoxicillin (AMOXIL) 875 MG tablet Take 1 tablet (875 mg total) by mouth 2 (two) times daily. (Patient not taking: Reported on 01/13/2016) 14 tablet 0  . ciprofloxacin (CILOXAN) 0.3 % ophthalmic solution Place 1 drop into the left eye every 4 (four) hours while awake. Administer 1 drop, every 2 hours, while awake, for 2 days. Then 1 drop, every 4 hours, while awake, for the next 5 days. (Patient not taking: Reported on 01/13/2016) 5 mL 0   No current facility-administered medications on file prior to visit.     BP (!) 150/70   Pulse 60   Temp 97.9 F (36.6 C)   Wt 178 lb (80.7 kg)   SpO2 94%   BMI 29.62 kg/m       Objective:   Physical Exam   General  Mental Status - Alert. General Appearance - Well groomed. Not in acute distress.  Skin Rashes- No Rashes.  HEENT Head- Normal. Ear Auditory Canal - Left- Normal. Right - Normal.Tympanic Membrane- Left- Normal. Right- Normal. Eye Sclera/Conjunctiva- Left- Normal. Right- Normal. Nose & Sinuses Nasal  Mucosa- Left-  Boggy and Congested. Right-  Boggy and  Congested.Bilateral no maxillary and  nofrontal sinus pressure. Mouth & Throat Lips: Upper Lip- Normal: no dryness, cracking, pallor, cyanosis, or vesicular eruption. Lower Lip-Normal: no dryness, cracking, pallor, cyanosis or vesicular eruption. Buccal Mucosa- Bilateral- No Aphthous ulcers. Oropharynx- No Discharge or Erythema. Tonsils: Characteristics- Bilateral- No Erythema  or Congestion. Size/Enlargement- Bilateral- No enlargement. Discharge- bilateral-None.  Neck Neck- Supple. No Masses.   Chest and Lung Exam Auscultation: Breath Sounds:- even and unlabored but mild symmetric wheeze heard both sides. More at bases.  Cardiovascular Auscultation:Rythm- Regular, rate and rhythm. Murmurs & Other Heart Sounds:Ausculatation of the heart reveal- No Murmurs.  Lymphatic Head & Neck General Head & Neck Lymphatics: Bilateral: Description- No Localized lymphadenopathy.     Assessment & Plan:  For your cough will rx benzonatate.   Wheezing will rx prednisone 4 day taper dose.  Continue to use your albuterol as needed.  Will get cxr and decide which antibiotic I will prescribe. Decided on azithromycin after reviewed the chest xray.  Follow up this Friday am or as needed  Please bring your form back on follow. Determine at that point if can get surgery.  Tabby Beaston, Percell Miller, PA-C

## 2016-01-13 NOTE — Patient Instructions (Addendum)
For your cough will rx benzonatate.   Wheezing will rx prednisone 4 day taper dose.  Continue to use your albuterol as needed.  Will get cxr and decide which antibiotic I will prescribe. Decided on azithromycin after reviewed the chest xray.  Follow up this Friday am or as needed  Please bring your form back on follow. Determine at that point if can get surgery.

## 2016-01-17 ENCOUNTER — Ambulatory Visit (INDEPENDENT_AMBULATORY_CARE_PROVIDER_SITE_OTHER): Payer: Medicare Other | Admitting: Medical

## 2016-01-17 ENCOUNTER — Encounter: Payer: Self-pay | Admitting: Medical

## 2016-01-17 VITALS — HR 63 | Temp 98.0°F | Ht 65.0 in | Wt 173.2 lb

## 2016-01-17 DIAGNOSIS — J209 Acute bronchitis, unspecified: Secondary | ICD-10-CM | POA: Diagnosis not present

## 2016-01-17 DIAGNOSIS — R062 Wheezing: Secondary | ICD-10-CM | POA: Diagnosis not present

## 2016-01-17 MED ORDER — ALBUTEROL SULFATE HFA 108 (90 BASE) MCG/ACT IN AERS: INHALATION_SPRAY | RESPIRATORY_TRACT | 3 refills | Status: DC

## 2016-01-17 NOTE — Progress Notes (Signed)
Pre visit review using our clinic tool,if applicable. No additional management support is needed unless otherwise documented below in the visit note.  

## 2016-01-17 NOTE — Progress Notes (Signed)
Subjective:    Patient ID: Karen Hoffman, female    DOB: [birth year], 75 y.o.   MRN: [id number]  HPI  Pt in for follow up. Pt needs clearance fo cataract surgery. Pt feels like new person. Pt cough and wheezing resolved. Only faint ticklish sensation to her throat. She takes halls lozenge and clears up. No fever, no chills and no sweats.  Review of Systems  Constitutional: Negative for chills, fatigue and fever.  HENT: Negative for congestion, dental problem, nosebleeds, postnasal drip, sinus pressure and sore throat.   Respiratory: Negative for cough, chest tightness, shortness of breath and wheezing.   Cardiovascular: Negative for chest pain and palpitations.  Gastrointestinal: Negative for abdominal pain.  Musculoskeletal: Negative for back pain.  Skin: Negative for rash.  Neurological: Negative for dizziness, light-headedness and headaches.  Hematological: Negative for adenopathy. Does not bruise/bleed easily.  Psychiatric/Behavioral: Negative for behavioral problems and confusion.    Past Medical History:  Diagnosis Date  . Allergy    improved since move to [place]  . Asthma    mild, intermittent, improved since moving to [place]  . Carcinoma in situ of skin of back   . Cataracts, bilateral   . Cervical cancer screening 06/19/2013  . Elevated BP   . Elevated LFTs 07/02/2010  . Glaucoma   . History of chicken pox 07/02/2010  . History of measles 07/02/2010  . HTN (hypertension) 08/06/2010  . Hyperlipidemia 09/04/2010  . Hypokalemia 11/18/2011  . Malignant melanoma of eye ([place])    left   . Medicare annual wellness visit, subsequent 04/21/2011  . Osteoporosis   . Overweight(278.02) 07/02/2010  . Thyroid disease 07/02/2010  . URI (upper respiratory infection) 11/17/2011  . Vitamin D deficiency 06/27/2015     Social History   Social History  . Marital status: Widowed    Spouse name: N/A  . Number of children: N/A  . Years of education: N/A   Occupational History  . Not on  file.   Social History Main Topics  . Smoking status: Former Smoker    Packs/day: 0.20    Years: 10.00    Types: Cigarettes    Quit date: 04/07/1975  . Smokeless tobacco: Never Used  . Alcohol use Yes     Comment: 1 glass of wine or coctail with dinner  . Drug use: No  . Sexual activity: No     Comment: lives with dog, no major dietary restrictions, wears seat  belt   Other Topics Concern  . Not on file   Social History Narrative  . No narrative on file    Past Surgical History:  Procedure Laterality Date  . broken ankle  1997   right, screws and plates in place  . left eye surgery    . TONSILLECTOMY    . TRUNK SKIN LESION EXCISIONAL BIOPSY     x twice    Family History  Problem Relation Age of Onset  . Cancer Mother     lung/ smoker  . Heart attack Paternal Grandmother   . Heart disease Father   . Hypertension Father   . Kidney disease Father 53    dialysis  . Obesity Father   . Diabetes type II Father   . Graves' disease Daughter   . Hypothyroidism Daughter     h/o Hashimoto's now hypothyroid  . Cholecystitis Son 65    cholecystectomy    Allergies  Allergen Reactions  . Ace Inhibitors Cough    Current Outpatient Prescriptions  on File Prior to Visit  Medication Sig Dispense Refill  . Albuterol Sulfate 108 (90 BASE) MCG/ACT AEPB Inhale 2 puffs into the lungs as needed.    Marland Kitchen azithromycin (ZITHROMAX) 250 MG tablet Take 2 tablets by mouth on day 1, followed by 1 tablet by mouth daily for 4 days. 6 tablet 0  . benzonatate (TESSALON) 100 MG capsule Take 1 capsule (100 mg total) by mouth 3 (three) times daily as needed for cough. 21 capsule 0  . brimonidine (ALPHAGAN) 0.15 % ophthalmic solution Place 1 drop into the left eye 3 (three) times daily.    . Calcium Citrate-Vitamin D (CALCIUM CITRATE + PO) Take 1 tablet by mouth daily.    . chlorthalidone (HYGROTON) 25 MG tablet TAKE 1 TABLET (25 MG TOTAL) BY MOUTH DAILY. 30 tablet 6  . ciprofloxacin (CILOXAN) 0.3 %  ophthalmic solution Place 1 drop into the left eye every 4 (four) hours while awake. Administer 1 drop, every 2 hours, while awake, for 2 days. Then 1 drop, every 4 hours, while awake, for the next 5 days. 5 mL 0  . dorzolamide-timolol (COSOPT) 22.3-6.8 MG/ML ophthalmic solution Place 1 drop into both eyes 2 (two) times daily.   5  . Flaxseed, Linseed, (FLAXSEED OIL) 1000 MG CAPS Take 1 capsule by mouth daily.    . fluticasone (FLONASE) 50 MCG/ACT nasal spray SPRAY TWICE INTO BOTH NOSTRILS EVERY DAY 16 g 1  . KLOR-CON M20 20 MEQ tablet TAKE 1 TABLET (20 MEQ TOTAL) BY MOUTH 2 (TWO) TIMES DAILY. 60 tablet 3  . latanoprost (XALATAN) 0.005 % ophthalmic solution Place 1 drop into both eyes at bedtime.   5  . loratadine (CLARITIN) 10 MG tablet Take 10 mg by mouth daily.    Marland Kitchen losartan (COZAAR) 25 MG tablet TAKE 1 TABLET BY MOUTH EVERY DAY 30 tablet 6  . milk thistle 175 MG tablet Take 250 mg by mouth daily.    . Multiple Vitamins-Minerals (CENTRUM ADULTS PO) Take 1 tablet by mouth daily.    . predniSONE (DELTASONE) 10 MG tablet 4 tab po day 1, 3 tab po day 2, 2 tab po day 2 then 1 tab po day 4. 10 tablet 0-  . PROAIR HFA 108 (90 Base) MCG/ACT inhaler INHALE 2 PUFFS INTO THE LUNGS EVERY 6 HOURS AS NEEDED FOR WHEEZING OR SHORTNESS OF BREATH 8.5 Inhaler 3  . Probiotic Product (ALIGN PO) Take 1 capsule by mouth daily.    . timolol (TIMOPTIC) 0.5 % ophthalmic solution 1 drop 2 (two) times daily.    . vitamin C (ASCORBIC ACID) 500 MG tablet Take 500 mg by mouth daily.     No current facility-administered medications on file prior to visit.     Pulse 63   Temp 98 F (36.7 C) (Oral)   Ht 5\' 5"  (1.651 m)   Wt 173 lb 3.2 oz (78.6 kg)   SpO2 99%   BMI 28.82 kg/m       Objective:   Physical Exam  General  Mental Status - Alert. General Appearance - Well groomed. Not in acute distress.  Skin Rashes- No Rashes.  HEENT Head- Normal. Ear Auditory Canal - Left- Normal. Right - Normal.Tympanic  Membrane- Left- Normal. Right- Normal. Eye Sclera/Conjunctiva- Left- Normal. Right- Normal. Nose & Sinuses Nasal Mucosa- Left-   Not Boggy and Congested. Right-   Not Boggy and  Congested.Bilateral no maxillary and no  frontal sinus pressure. Mouth & Throat Lips: Upper Lip- Normal: no dryness, cracking,  pallor, cyanosis, or vesicular eruption. Lower Lip-Normal: no dryness, cracking, pallor, cyanosis or vesicular eruption. Buccal Mucosa- Bilateral- No Aphthous ulcers. Oropharynx- No Discharge or Erythema. No pnd. Tonsils: Characteristics- Bilateral- No Erythema or Congestion. Size/Enlargement- Bilateral- No enlargement. Discharge- bilateral-None.  Neck Neck- Supple. No Masses.   Chest and Lung Exam Auscultation: Breath Sounds:-Clear even and unlabored.  Cardiovascular Auscultation:Rythm- Regular, rate and rhythm. Murmurs & Other Heart Sounds:Ausculatation of the heart reveal- No Murmurs.  Lymphatic Head & Neck General Head & Neck Lymphatics: Bilateral: Description- No Localized lymphadenopathy.       Assessment & Plan:  Your wheezing and bronchitis has cleared. We discussed today to try to treat allergy symptoms early in fall and spring as this may precipitate wheezing and bronchitis type signs and symptoms.(can use flonase and claritin.) Also having albuterol available in future based on xray finding may helpful to use if needed as well.  I am filling your your medical clearance procedure form. Can proceed with procedure provided no recurrent signs or symptoms.  Follow up as regularly scheduled with pcp or as needed  Joey Hudock, Percell Miller, PA-C

## 2016-01-17 NOTE — Patient Instructions (Addendum)
Your wheezing and bronchitis has cleared. We discussed today to try to treat allergy symptoms early in fall and spring as this may precipitate wheezing and bronchitis type signs and symptoms.(can use flonase and claritin.) Also having albuterol available in future based on xray finding may helpful to use if needed as well.  I am filling your your medical clearance procedure form. Can proceed with procedure provided no recurrent signs or symptoms.  Follow up as regularly scheduled with pcp or as needed

## 2016-01-19 ENCOUNTER — Other Ambulatory Visit: Payer: Self-pay | Admitting: Family Medicine

## 2016-01-20 DIAGNOSIS — H401112 Primary open-angle glaucoma, right eye, moderate stage: Secondary | ICD-10-CM | POA: Diagnosis not present

## 2016-01-20 DIAGNOSIS — H25811 Combined forms of age-related cataract, right eye: Secondary | ICD-10-CM | POA: Diagnosis not present

## 2016-01-20 DIAGNOSIS — H5211 Myopia, right eye: Secondary | ICD-10-CM | POA: Diagnosis not present

## 2016-01-20 DIAGNOSIS — H2511 Age-related nuclear cataract, right eye: Secondary | ICD-10-CM | POA: Diagnosis not present

## 2016-01-29 DIAGNOSIS — Z23 Encounter for immunization: Secondary | ICD-10-CM | POA: Diagnosis not present

## 2016-02-13 DIAGNOSIS — L570 Actinic keratosis: Secondary | ICD-10-CM | POA: Diagnosis not present

## 2016-02-13 DIAGNOSIS — L820 Inflamed seborrheic keratosis: Secondary | ICD-10-CM | POA: Diagnosis not present

## 2016-02-24 DIAGNOSIS — H25012 Cortical age-related cataract, left eye: Secondary | ICD-10-CM | POA: Diagnosis not present

## 2016-02-24 DIAGNOSIS — H2512 Age-related nuclear cataract, left eye: Secondary | ICD-10-CM | POA: Diagnosis not present

## 2016-03-04 DIAGNOSIS — H04123 Dry eye syndrome of bilateral lacrimal glands: Secondary | ICD-10-CM | POA: Diagnosis not present

## 2016-03-12 ENCOUNTER — Other Ambulatory Visit: Payer: Self-pay | Admitting: Family Medicine

## 2016-05-22 ENCOUNTER — Ambulatory Visit (HOSPITAL_BASED_OUTPATIENT_CLINIC_OR_DEPARTMENT_OTHER)
Admission: RE | Admit: 2016-05-22 | Discharge: 2016-05-22 | Disposition: A | Discharge status: Home / Self Care | Payer: Medicare Other | Source: Ambulatory Visit | Attending: Medical | Admitting: Medical

## 2016-05-22 ENCOUNTER — Encounter: Payer: Self-pay | Admitting: Medical

## 2016-05-22 ENCOUNTER — Ambulatory Visit (INDEPENDENT_AMBULATORY_CARE_PROVIDER_SITE_OTHER): Payer: Medicare Other | Admitting: Medical

## 2016-05-22 VITALS — BP 113/69 | HR 60 | Temp 97.8°F | Ht 65.0 in | Wt 178.4 lb

## 2016-05-22 DIAGNOSIS — R059 Cough, unspecified: Secondary | ICD-10-CM

## 2016-05-22 DIAGNOSIS — J40 Bronchitis, not specified as acute or chronic: Secondary | ICD-10-CM

## 2016-05-22 DIAGNOSIS — R05 Cough: Secondary | ICD-10-CM | POA: Insufficient documentation

## 2016-05-22 DIAGNOSIS — R062 Wheezing: Secondary | ICD-10-CM | POA: Insufficient documentation

## 2016-05-22 LAB — CBC WITH DIFFERENTIAL/PLATELET
BASOS ABS: 79 {cells}/uL (ref 0–200)
Basophils Relative: 1 %
EOS ABS: 316 {cells}/uL (ref 15–500)
Eosinophils Relative: 4 %
HEMATOCRIT: 43.3 % (ref 35.0–45.0)
HEMOGLOBIN: 14.7 g/dL (ref 11.7–15.5)
LYMPHS ABS: 2449 {cells}/uL (ref 850–3900)
Lymphocytes Relative: 31 %
MCH: 30.6 pg (ref 27.0–33.0)
MCHC: 33.9 g/dL (ref 32.0–36.0)
MCV: 90.2 fL (ref 80.0–100.0)
MONO ABS: 711 {cells}/uL (ref 200–950)
MPV: 9.7 fL (ref 7.5–12.5)
Monocytes Relative: 9 %
Neutro Abs: 4345 cells/uL (ref 1500–7800)
Neutrophils Relative %: 55 %
Platelets: 219 10*3/uL (ref 140–400)
RBC: 4.8 MIL/uL (ref 3.80–5.10)
RDW: 13.1 % (ref 11.0–15.0)
WBC: 7.9 10*3/uL (ref 3.8–10.8)

## 2016-05-22 MED ORDER — PREDNISONE 10 MG PO TABS: ORAL_TABLET | ORAL | 0 refills | Status: DC

## 2016-05-22 MED ORDER — BENZONATATE 100 MG PO CAPS: 100.0000 mg | ORAL_CAPSULE | Freq: Three times a day (TID) | ORAL | 0 refills | Status: DC | PRN

## 2016-05-22 MED ORDER — DOXYCYCLINE HYCLATE 100 MG PO TABS: 100.0000 mg | ORAL_TABLET | Freq: Two times a day (BID) | ORAL | 0 refills | Status: DC

## 2016-05-22 MED ORDER — METHYLPREDNISOLONE ACETATE 40 MG/ML IJ SUSP
40.0000 mg | Freq: Once | INTRAMUSCULAR | Status: AC
  Administered 2016-05-22: 40 mg via INTRAMUSCULAR

## 2016-05-22 NOTE — Progress Notes (Signed)
Pre visit review using our clinic review tool, if applicable. No additional management support is needed unless otherwise documented below in the visit note. 

## 2016-05-22 NOTE — Progress Notes (Signed)
Subjective:    Patient ID: Karen Hoffman, female    DOB: 05-28-1940, 76 y.o.   MRN: [id number]  HPI  Pt in states she is using claritin, mucinex and flonase.   Pt states she cough, chest congestion, and wheezing for 2 weeks. Pt bringing up some mucous. When walks get sob.  Pt does have history of smoking 10 years. Pack a week. Some second hand smoke exposure as a child.   Pt has no fever, no chills or sweats.  Pt not diabetic.  Pt is using albuterol every 6 hours. She states typically unless she is gets sick.    Review of Systems  Constitutional: Negative for chills, fatigue and fever.  Respiratory: Positive for cough and wheezing. Negative for shortness of breath.        Chest congestion.  Cardiovascular: Negative for chest pain and palpitations.  Gastrointestinal: Negative for abdominal pain, nausea and vomiting.  Musculoskeletal: Negative for back pain, gait problem and myalgias.  Skin: Negative for rash.  Neurological: Negative for dizziness and headaches.  Hematological: Negative for adenopathy. Does not bruise/bleed easily.  Psychiatric/Behavioral: Negative for behavioral problems, confusion and suicidal ideas. The patient is not nervous/anxious.     Past Medical History:  Diagnosis Date  . Allergy    improved since move to [place]  . Asthma    mild, intermittent, improved since moving to [place]  . Carcinoma in situ of skin of back   . Cataracts, bilateral   . Cervical cancer screening 06/19/2013  . Elevated BP   . Elevated LFTs 07/02/2010  . Glaucoma   . History of chicken pox 07/02/2010  . History of measles 07/02/2010  . HTN (hypertension) 08/06/2010  . Hyperlipidemia 09/04/2010  . Hypokalemia 11/18/2011  . Malignant melanoma of eye ([place])    left   . Medicare annual wellness visit, subsequent 04/21/2011  . Osteoporosis   . Overweight(278.02) 07/02/2010  . Thyroid disease 07/02/2010  . URI (upper respiratory infection) 11/17/2011  . Vitamin D deficiency 06/27/2015      Social History   Social History  . Marital status: Widowed    Spouse name: N/A  . Number of children: N/A  . Years of education: N/A   Occupational History  . Not on file.   Social History Main Topics  . Smoking status: Former Smoker    Packs/day: 0.20    Years: 10.00    Types: Cigarettes    Quit date: 04/07/1975  . Smokeless tobacco: Never Used  . Alcohol use Yes     Comment: 1 glass of wine or coctail with dinner  . Drug use: No  . Sexual activity: No     Comment: lives with dog, no major dietary restrictions, wears seat  belt   Other Topics Concern  . Not on file   Social History Narrative  . No narrative on file    Past Surgical History:  Procedure Laterality Date  . broken ankle  1997   right, screws and plates in place  . left eye surgery    . TONSILLECTOMY    . TRUNK SKIN LESION EXCISIONAL BIOPSY     x twice    Family History  Problem Relation Age of Onset  . Cancer Mother     lung/ smoker  . Heart attack Paternal Grandmother   . Heart disease Father   . Hypertension Father   . Kidney disease Father 63    dialysis  . Obesity Father   . Diabetes type  II Father   . Graves' disease Daughter   . Hypothyroidism Daughter     h/o Hashimoto's now hypothyroid  . Cholecystitis Son 33    cholecystectomy    Allergies  Allergen Reactions  . Ace Inhibitors Cough    Current Outpatient Prescriptions on File Prior to Visit  Medication Sig Dispense Refill  . albuterol (PROAIR HFA) 108 (90 Base) MCG/ACT inhaler INHALE 2 PUFFS INTO THE LUNGS EVERY 6 HOURS AS NEEDED FOR WHEEZING OR SHORTNESS OF BREATH 8.5 Inhaler 3  . brimonidine (ALPHAGAN) 0.15 % ophthalmic solution Place 1 drop into the left eye 3 (three) times daily.    . Calcium Citrate-Vitamin D (CALCIUM CITRATE + PO) Take 1 tablet by mouth daily.    . chlorthalidone (HYGROTON) 25 MG tablet TAKE 1 TABLET (25 MG TOTAL) BY MOUTH DAILY. 30 tablet 6  . dorzolamide-timolol (COSOPT) 22.3-6.8 MG/ML  ophthalmic solution Place 1 drop into both eyes 2 (two) times daily.   5  . Flaxseed, Linseed, (FLAXSEED OIL) 1000 MG CAPS Take 1 capsule by mouth daily.    . fluticasone (FLONASE) 50 MCG/ACT nasal spray SPRAY TWICE INTO BOTH NOSTRILS EVERY DAY 16 g 1  . KLOR-CON M20 20 MEQ tablet TAKE 1 TABLET (20 MEQ TOTAL) BY MOUTH 2 (TWO) TIMES DAILY. 60 tablet 3  . latanoprost (XALATAN) 0.005 % ophthalmic solution Place 1 drop into both eyes at bedtime.   5  . loratadine (CLARITIN) 10 MG tablet Take 10 mg by mouth daily.    Marland Kitchen losartan (COZAAR) 25 MG tablet TAKE 1 TABLET BY MOUTH EVERY DAY 30 tablet 6  . milk thistle 175 MG tablet Take 250 mg by mouth daily.    . Multiple Vitamins-Minerals (CENTRUM ADULTS PO) Take 1 tablet by mouth daily.    . Probiotic Product (ALIGN PO) Take 1 capsule by mouth daily.    . timolol (TIMOPTIC) 0.5 % ophthalmic solution 1 drop 2 (two) times daily.    . vitamin C (ASCORBIC ACID) 500 MG tablet Take 500 mg by mouth daily.     No current facility-administered medications on file prior to visit.     BP 113/69   Pulse 60   Temp 97.8 F (36.6 C) (Oral)   Ht 5\' 5"  (1.651 m)   Wt 178 lb 6.4 oz (80.9 kg)   SpO2 96%   BMI 29.69 kg/m       Objective:   Physical Exam  General  Mental Status - Alert. General Appearance - Well groomed. Not in acute distress.  Skin Rashes- No Rashes.  HEENT Head- Normal. Ear Auditory Canal - Left- Normal. Right - Normal.Tympanic Membrane- Left- Normal. Right- Normal. Eye Sclera/Conjunctiva- Left- Normal. Right- Normal. Nose & Sinuses Nasal Mucosa- Left-  Boggy and Congested. Right-  Boggy and  Congested.Bilateral no  maxillary and  No frontal sinus pressure. Mouth & Throat Lips: Upper Lip- Normal: no dryness, cracking, pallor, cyanosis, or vesicular eruption. Lower Lip-Normal: no dryness, cracking, pallor, cyanosis or vesicular eruption. Buccal Mucosa- Bilateral- No Aphthous ulcers. Oropharynx- No Discharge or Erythema. Tonsils:  Characteristics- Bilateral- No Erythema or Congestion. Size/Enlargement- Bilateral- No enlargement. Discharge- bilateral-None.  Neck Neck- Supple. No Masses.   Chest and Lung Exam Auscultation: Breath Sounds:-even and unlabored. But diffuse coarse breath sounds.  Cardiovascular Auscultation:Rythm- Regular, rate and rhythm. Murmurs & Other Heart Sounds:Ausculatation of the heart reveal- No Murmurs.  Lymphatic Head & Neck General Head & Neck Lymphatics: Bilateral: Description- No Localized lymphadenopathy.  Lower ext- no edema.  Assessment & Plan:  [phone].  Depomedrol 40 mg im  You appear to have bronchitis with wheezing. Rest hydrate and tylenol for fever. I am prescribing cough medicine benzonatate, and doxycycline antibiotic.  For wheezing taper dose prednisone and use proair if needed.  Get cxr today and cbc.  Follow up in 7-10 days or as needed  Robson Trickey, Percell Miller, [employer]

## 2016-05-22 NOTE — Patient Instructions (Signed)
You appear to have bronchitis with wheezing. Rest hydrate and tylenol for fever. I am prescribing cough medicine benzonatate, and doxycycline antibiotic.  For wheezing taper dose prednisone and use proair if needed.  Get cxr today and cbc.  Follow up in 7-10 days or as needed

## 2016-07-09 ENCOUNTER — Encounter: Payer: Medicare Other | Admitting: Family Medicine

## 2016-07-13 DIAGNOSIS — L820 Inflamed seborrheic keratosis: Secondary | ICD-10-CM | POA: Diagnosis not present

## 2016-07-13 DIAGNOSIS — D235 Other benign neoplasm of skin of trunk: Secondary | ICD-10-CM | POA: Diagnosis not present

## 2016-07-13 DIAGNOSIS — L570 Actinic keratosis: Secondary | ICD-10-CM | POA: Diagnosis not present

## 2016-07-13 DIAGNOSIS — Z85820 Personal history of malignant melanoma of skin: Secondary | ICD-10-CM | POA: Diagnosis not present

## 2016-07-13 DIAGNOSIS — L814 Other melanin hyperpigmentation: Secondary | ICD-10-CM | POA: Diagnosis not present

## 2016-07-20 ENCOUNTER — Other Ambulatory Visit: Payer: Self-pay | Admitting: Family Medicine

## 2016-07-20 MED ORDER — CHLORTHALIDONE 25 MG PO TABS: 25.0000 mg | ORAL_TABLET | Freq: Every day | ORAL | 1 refills | Status: DC

## 2016-07-22 ENCOUNTER — Telehealth: Payer: Self-pay | Admitting: Family Medicine

## 2016-07-22 NOTE — Telephone Encounter (Signed)
Relation to [id number] Call back number:[phone]   Reason for call:  Patient requesting pre visit labs prior to 07/27/16 physical appointment, please advise

## 2016-07-23 ENCOUNTER — Other Ambulatory Visit: Payer: Medicare Other

## 2016-07-23 ENCOUNTER — Other Ambulatory Visit: Payer: Self-pay | Admitting: Family Medicine

## 2016-07-23 DIAGNOSIS — E785 Hyperlipidemia, unspecified: Secondary | ICD-10-CM

## 2016-07-23 DIAGNOSIS — E559 Vitamin D deficiency, unspecified: Secondary | ICD-10-CM

## 2016-07-23 DIAGNOSIS — I10 Essential (primary) hypertension: Secondary | ICD-10-CM

## 2016-07-23 NOTE — Telephone Encounter (Signed)
Patient contacted and scheduled her a lab appointment today 07/23/16 and also put in lab order.

## 2016-07-23 NOTE — Telephone Encounter (Signed)
OK to order cmp, cbc, tsh, lipid, vit d for HTN, hyperlipidemia, vit D def

## 2016-07-24 ENCOUNTER — Other Ambulatory Visit (INDEPENDENT_AMBULATORY_CARE_PROVIDER_SITE_OTHER): Payer: Medicare Other

## 2016-07-24 DIAGNOSIS — I10 Essential (primary) hypertension: Secondary | ICD-10-CM | POA: Diagnosis not present

## 2016-07-24 DIAGNOSIS — E559 Vitamin D deficiency, unspecified: Secondary | ICD-10-CM | POA: Diagnosis not present

## 2016-07-24 DIAGNOSIS — E785 Hyperlipidemia, unspecified: Secondary | ICD-10-CM

## 2016-07-24 LAB — COMPREHENSIVE METABOLIC PANEL
ALBUMIN: 3.9 g/dL (ref 3.5–5.2)
ALK PHOS: 82 U/L (ref 39–117)
ALT: 27 U/L (ref 0–35)
AST: 20 U/L (ref 0–37)
BILIRUBIN TOTAL: 0.6 mg/dL (ref 0.2–1.2)
BUN: 16 mg/dL (ref 6–23)
CO2: 33 mEq/L — ABNORMAL HIGH (ref 19–32)
Calcium: 9.2 mg/dL (ref 8.4–10.5)
Chloride: 100 mEq/L (ref 96–112)
Creatinine, Ser: 0.89 mg/dL (ref 0.40–1.20)
GFR: 65.58 mL/min (ref >60.00)
Glucose, Bld: 102 mg/dL — ABNORMAL HIGH (ref 70–99)
POTASSIUM: 3.1 meq/L — AB (ref 3.5–5.1)
Sodium: 139 mEq/L (ref 135–145)
Total Protein: 6.6 g/dL (ref 6.0–8.3)

## 2016-07-24 LAB — LIPID PANEL
CHOLESTEROL: 200 mg/dL (ref 0–200)
HDL: 54.7 mg/dL (ref >39.00)
LDL CALC: 124 mg/dL — AB (ref 0–99)
NonHDL: 145.03
Total CHOL/HDL Ratio: 4
Triglycerides: 107 mg/dL (ref 0.0–149.0)
VLDL: 21.4 mg/dL (ref 0.0–40.0)

## 2016-07-24 LAB — VITAMIN D 25 HYDROXY (VIT D DEFICIENCY, FRACTURES): VITD: 44.75 ng/mL (ref 30.00–100.00)

## 2016-07-24 LAB — CBC
HCT: 42.3 % (ref 36.0–46.0)
HEMOGLOBIN: 14.3 g/dL (ref 12.0–15.0)
MCHC: 33.8 g/dL (ref 30.0–36.0)
MCV: 90.6 fl (ref 78.0–100.0)
PLATELETS: 214 10*3/uL (ref 150.0–400.0)
RBC: 4.67 Mil/uL (ref 3.87–5.11)
RDW: 13.6 % (ref 11.5–15.5)
WBC: 8.2 10*3/uL (ref 4.0–10.5)

## 2016-07-24 LAB — TSH: TSH: 2.79 u[IU]/mL (ref 0.35–4.50)

## 2016-07-26 ENCOUNTER — Other Ambulatory Visit: Payer: Self-pay | Admitting: Family Medicine

## 2016-07-26 MED ORDER — POTASSIUM CHLORIDE CRYS ER 20 MEQ PO TBCR: EXTENDED_RELEASE_TABLET | ORAL | 1 refills | Status: DC

## 2016-07-27 ENCOUNTER — Ambulatory Visit (INDEPENDENT_AMBULATORY_CARE_PROVIDER_SITE_OTHER): Payer: Medicare Other | Admitting: Family Medicine

## 2016-07-27 ENCOUNTER — Encounter: Payer: Self-pay | Admitting: Family Medicine

## 2016-07-27 VITALS — BP 131/72 | HR 60 | Temp 98.1°F | Ht 65.0 in | Wt 181.8 lb

## 2016-07-27 DIAGNOSIS — E079 Disorder of thyroid, unspecified: Secondary | ICD-10-CM

## 2016-07-27 DIAGNOSIS — E782 Mixed hyperlipidemia: Secondary | ICD-10-CM

## 2016-07-27 DIAGNOSIS — M810 Age-related osteoporosis without current pathological fracture: Secondary | ICD-10-CM

## 2016-07-27 DIAGNOSIS — I10 Essential (primary) hypertension: Secondary | ICD-10-CM

## 2016-07-27 DIAGNOSIS — Z0000 Encounter for general adult medical examination without abnormal findings: Secondary | ICD-10-CM | POA: Diagnosis not present

## 2016-07-27 DIAGNOSIS — H04129 Dry eye syndrome of unspecified lacrimal gland: Secondary | ICD-10-CM

## 2016-07-27 DIAGNOSIS — E876 Hypokalemia: Secondary | ICD-10-CM | POA: Diagnosis not present

## 2016-07-27 MED ORDER — LOSARTAN POTASSIUM 50 MG PO TABS
50.0000 mg | ORAL_TABLET | Freq: Every day | ORAL | 2 refills | Status: DC
Start: 2016-07-27 — End: 2016-09-25

## 2016-07-27 NOTE — Progress Notes (Signed)
Pre visit review using our clinic review tool, if applicable. No additional management support is needed unless otherwise documented below in the visit note. 

## 2016-07-27 NOTE — Progress Notes (Signed)
Patient ID: Karen Hoffman, female   DOB: 02/27/1941, 76 y.o.   MRN: [id number]   Subjective:  I acted as a Education administrator for Penni Homans, [place], [place]   Patient ID: Karen Hoffman, female    DOB: 05-19-1940, 76 y.o.   MRN: [id number]  Chief Complaint  Patient presents with  . Medicare Wellness Visit  . Hyperlipidemia  . Hypertension    HPI  Patient is in today for a Medicare Wellness Examination. States that she has stopped taking Klor-Con; stating that it was causing stomach pain and that she felt acid rising. Patient reports no recent Hx of Falls, issues with depression. Patient has a Hx of hyperlipidemia, HTN, Vitamin D Deficiency. Patient has no acute concerns noted at this time. She notes her nausea has improved. Doing well with ADLOS  Patient Care Team: Mosie Lukes, MD as PCP - General (Family Medicine) Audrie Lia, MD (Dermatology) Mosie Lukes, MD as Consulting Physician (Gynecology) Despina Hick, MD as Consulting Physician (Ophthalmology)   Past Medical History:  Diagnosis Date  . Allergy    improved since move to [place]  . Asthma    mild, intermittent, improved since moving to [place]  . Carcinoma in situ of skin of back   . Cataracts, bilateral   . Cervical cancer screening 06/19/2013  . Elevated BP   . Elevated LFTs 07/02/2010  . Glaucoma   . History of chicken pox 07/02/2010  . History of measles 07/02/2010  . HTN (hypertension) 08/06/2010  . Hyperlipidemia 09/04/2010  . Hypokalemia 11/18/2011  . 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 Medicare annual wellness visit, subsequent 04/21/2011  . Osteoporosis   . Overweight(278.02) 07/02/2010  . Thyroid disease 07/02/2010  . URI (upper respiratory infection) 11/17/2011  . Vitamin D deficiency 06/27/2015    Past Surgical History:  Procedure Laterality Date  . broken ankle  1997   right, screws and plates in place  . left eye surgery    . TONSILLECTOMY    . TRUNK SKIN LESION EXCISIONAL BIOPSY     x twice     Family History  Problem Relation Age of Onset  . Cancer Mother     lung/ smoker  . Heart attack Paternal Grandmother   . Heart disease Father   . Hypertension Father   . Kidney disease Father 24    dialysis  . Obesity Father   . Diabetes type II Father   . Graves' disease Daughter   . Hypothyroidism Daughter     h/o Hashimoto's now hypothyroid  . Cholecystitis Son 71    cholecystectomy    Social History   Social History  . Marital status: Widowed    Spouse name: N/A  . Number of children: N/A  . Years of education: N/A   Occupational History  . Not on file.   Social History Main Topics  . Smoking status: Former Smoker    Packs/day: 0.20    Years: 10.00    Types: Cigarettes    Quit date: 04/07/1975  . Smokeless tobacco: Never Used  . Alcohol use Yes     Comment: 1 glass of wine or coctail with dinner  . Drug use: No  . Sexual activity: No     Comment: lives with dog, no major dietary restrictions, wears seat  belt   Other Topics Concern  . Not on file   Social History Narrative  . No narrative on file  Outpatient Medications Prior to Visit  Medication Sig Dispense Refill  . albuterol (PROAIR HFA) 108 (90 Base) MCG/ACT inhaler INHALE 2 PUFFS INTO THE LUNGS EVERY 6 HOURS AS NEEDED FOR WHEEZING OR SHORTNESS OF BREATH 8.5 Inhaler 3  . benzonatate (TESSALON) 100 MG capsule Take 1 capsule (100 mg total) by mouth 3 (three) times daily as needed for cough. 21 capsule 0  . brimonidine (ALPHAGAN) 0.15 % ophthalmic solution Place 1 drop into the left eye 3 (three) times daily.    . Calcium Citrate-Vitamin D (CALCIUM CITRATE + PO) Take 1 tablet by mouth daily.    . chlorthalidone (HYGROTON) 25 MG tablet Take 1 tablet (25 mg total) by mouth daily. 90 tablet 1  . dorzolamide-timolol (COSOPT) 22.3-6.8 MG/ML ophthalmic solution Place 1 drop into both eyes 2 (two) times daily.   5  . doxycycline (VIBRA-TABS) 100 MG tablet Take 1 tablet (100 mg total) by mouth 2 (two)  times daily. Caps or generic ok. 20 tablet 0  . Flaxseed, Linseed, (FLAXSEED OIL) 1000 MG CAPS Take 1 capsule by mouth daily.    . fluticasone (FLONASE) 50 MCG/ACT nasal spray SPRAY TWICE INTO BOTH NOSTRILS EVERY DAY 16 g 1  . latanoprost (XALATAN) 0.005 % ophthalmic solution Place 1 drop into both eyes at bedtime.   5  . loratadine (CLARITIN) 10 MG tablet Take 10 mg by mouth daily.    . milk thistle 175 MG tablet Take 250 mg by mouth daily.    . Multiple Vitamins-Minerals (CENTRUM ADULTS PO) Take 1 tablet by mouth daily.    . predniSONE (DELTASONE) 10 MG tablet 6 TAB PO DAY 1 5 TAB PO DAY 2 4 TAB PO DAY 3 3 TAB PO DAY 4 2 TAB PO DAY 5 1 TAB PO DAY 6 21 tablet 0  . Probiotic Product (ALIGN PO) Take 1 capsule by mouth daily.    . timolol (TIMOPTIC) 0.5 % ophthalmic solution 1 drop 2 (two) times daily.    . vitamin C (ASCORBIC ACID) 500 MG tablet Take 500 mg by mouth daily.    Marland Kitchen losartan (COZAAR) 25 MG tablet TAKE 1 TABLET BY MOUTH EVERY DAY 30 tablet 6  . KLOR-CON M20 20 MEQ tablet TAKE 1 TABLET (20 MEQ TOTAL) BY MOUTH 2 (TWO) TIMES DAILY. (Patient not taking: Reported on 07/27/2016) 60 tablet 3  . potassium chloride SA (K-DUR,KLOR-CON) 20 MEQ tablet 2 tabs po daily x 3 days then 1 tab po daily prn (Patient not taking: Reported on 07/27/2016) 30 tablet 1   No facility-administered medications prior to visit.     Allergies  Allergen Reactions  . Ace Inhibitors Cough    Review of Systems  Constitutional: Negative for fever and malaise/fatigue.  HENT: Negative for congestion.   Eyes: Negative for blurred vision and redness.       Itchy, red eyes.  Respiratory: Negative for cough and shortness of breath.   Cardiovascular: Negative for chest pain, palpitations and leg swelling.  Gastrointestinal: Negative for vomiting.  Musculoskeletal: Negative for back pain.  Skin: Negative for rash.  Neurological: Negative for loss of consciousness and headaches.       Objective:    Physical  Exam  Constitutional: She is oriented to person, place, and time. She appears well-developed and well-nourished. No distress.  HENT:  Head: Normocephalic and atraumatic.  Nose: Nose normal.  Eyes: Conjunctivae are normal. Right eye exhibits no discharge. Left eye exhibits no discharge.  Itchy, red eyes (left eyes)  Neck: Normal  range of motion. Neck supple. No thyromegaly present.  Cardiovascular: Normal rate and regular rhythm.   No murmur heard. Pulmonary/Chest: Effort normal and breath sounds normal. She has no wheezes.  Abdominal: Soft. Bowel sounds are normal. There is no tenderness.  Musculoskeletal: She exhibits no edema or deformity.  Neurological: She is alert and oriented to person, place, and time.  Skin: Skin is warm and dry. She is not diaphoretic.  Psychiatric: She has a normal mood and affect.  Nursing note and vitals reviewed.   BP 131/72 (BP Location: Left Arm, Patient Position: Sitting, Cuff Size: Normal)   Pulse 60   Temp 98.1 F (36.7 C) (Oral)   Ht 5\' 5"  (1.651 m)   Wt 181 lb 12.8 oz (82.5 kg)   SpO2 99% Comment: RA  BMI 30.25 kg/m  Wt Readings from Last 3 Encounters:  07/27/16 181 lb 12.8 oz (82.5 kg)  05/22/16 178 lb 6.4 oz (80.9 kg)  01/17/16 173 lb 3.2 oz (78.6 kg)   BP Readings from Last 3 Encounters:  07/27/16 131/72  05/22/16 113/69  01/13/16 (!) 150/70     Immunization History  Administered Date(s) Administered  . Influenza Split 12/28/2011, 01/29/2016  . Influenza Whole 01/05/2011  . Influenza,inj,Quad PF,36+ Mos 12/26/2012, 12/14/2013  . Influenza-Unspecified 02/06/2015  . Pneumococcal Conjugate-13 06/19/2013  . Pneumococcal Polysaccharide-23 04/06/2005, 06/27/2015  . Tdap 04/21/2011  . Zoster 11/05/2011    Health Maintenance  Topic Date Due  . DEXA SCAN  06/06/2018 (Originally 10/18/2005)  . COLONOSCOPY  07/11/2018 (Originally 10/19/1990)  . INFLUENZA VACCINE  11/04/2016  . TETANUS/TDAP  04/20/2021  . PNA vac Low Risk Adult   Completed    Lab Results  Component Value Date   WBC 8.2 07/24/2016   HGB 14.3 07/24/2016   HCT 42.3 07/24/2016   PLT 214.0 07/24/2016   GLUCOSE 102 (H) 07/24/2016   CHOL 200 07/24/2016   TRIG 107.0 07/24/2016   HDL 54.70 07/24/2016   LDLDIRECT 131.6 12/12/2012   LDLCALC 124 (H) 07/24/2016   ALT 27 07/24/2016   AST 20 07/24/2016   NA 139 07/24/2016   K 3.1 (L) 07/24/2016   CL 100 07/24/2016   CREATININE 0.89 07/24/2016   BUN 16 07/24/2016   CO2 33 (H) 07/24/2016   TSH 2.79 07/24/2016    Lab Results  Component Value Date   TSH 2.79 07/24/2016   Lab Results  Component Value Date   WBC 8.2 07/24/2016   HGB 14.3 07/24/2016   HCT 42.3 07/24/2016   MCV 90.6 07/24/2016   PLT 214.0 07/24/2016   Lab Results  Component Value Date   NA 139 07/24/2016   K 3.1 (L) 07/24/2016   CO2 33 (H) 07/24/2016   GLUCOSE 102 (H) 07/24/2016   BUN 16 07/24/2016   CREATININE 0.89 07/24/2016   BILITOT 0.6 07/24/2016   ALKPHOS 82 07/24/2016   AST 20 07/24/2016   ALT 27 07/24/2016   PROT 6.6 07/24/2016   ALBUMIN 3.9 07/24/2016   CALCIUM 9.2 07/24/2016   GFR 65.58 07/24/2016   Lab Results  Component Value Date   CHOL 200 07/24/2016   Lab Results  Component Value Date   HDL 54.70 07/24/2016   Lab Results  Component Value Date   LDLCALC 124 (H) 07/24/2016   Lab Results  Component Value Date   TRIG 107.0 07/24/2016   Lab Results  Component Value Date   CHOLHDL 4 07/24/2016   No results found for: HGBA1C  Assessment & Plan:   Problem List Items Addressed This Visit    Osteoporosis    Encouraged to get adequate exercise, calcium and vitamin d intake. She declines Dexa scan for now.. Continue Vitamin D and calcium supplements.      Thyroid disease   HTN (hypertension)    Well controlled, no changes to meds. Encouraged heart healthy diet such as the DASH diet and exercise as tolerated. Feels well but her K keeps going down. Will d/c Chlorthalidone and increase  Losartan to 50 mg daily      Relevant Medications   losartan (COZAAR) 50 MG tablet   Other Relevant Orders   Comprehensive metabolic panel   Hyperlipidemia    Encouraged heart healthy diet, increase exercise, avoid trans fats, consider a krill oil cap daily      Relevant Medications   losartan (COZAAR) 50 MG tablet   Medicare annual wellness visit, subsequent    Patient denies any difficulties at home. No trouble with ADLs, depression or falls. See EMR for functional status screen and depression screen. No recent changes to vision or hearing. Is UTD with immunizations. Is UTD with screening. Discussed Advanced Directives. Encouraged heart healthy diet, exercise as tolerated and adequate sleep. See patient's problem list for health risk factors to monitor. See AVS for preventative healthcare recommendation schedule.      Hypokalemia    Cannot tolerate KCL causes nausea      Dry eye syndrome    Scratched eye while putting eye ointment in after a cataract removal. She is now struggling with an itchy, red eye, no pain sees her opthamologist, Dr Arlina Robes on Friday       Other Visit Diagnoses    Preventative health care    -  Primary      I have discontinued Ms. Wojahn's KLOR-CON M20 and losartan. I am also having her start on losartan. Additionally, I am having her maintain her dorzolamide-timolol, latanoprost, vitamin C, Calcium Citrate-Vitamin D (CALCIUM CITRATE + PO), Flaxseed Oil, milk thistle, Multiple Vitamins-Minerals (CENTRUM ADULTS PO), Probiotic Product (ALIGN PO), timolol, loratadine, brimonidine, fluticasone, albuterol, doxycycline, benzonatate, predniSONE, chlorthalidone, and potassium chloride SA.  Meds ordered this encounter  Medications  . potassium chloride SA (K-DUR,KLOR-CON) 20 MEQ tablet    Sig: Take 20 mEq by mouth 2 (two) times daily. Take 2 tabs po x3 days, then 1 tab po prn.  . losartan (COZAAR) 50 MG tablet    Sig: Take 1 tablet (50 mg total) by mouth daily.     Dispense:  30 tablet    Refill:  2    CMA served as scribe during this visit. History, Physical and Plan performed by medical provider. Documentation and orders reviewed and attested to.  Penni Homans, MD

## 2016-07-27 NOTE — Assessment & Plan Note (Signed)
Scratched eye while putting eye ointment in after a cataract removal. She is now struggling with an itchy, red eye, no pain sees her opthamologist, Dr Arlina Robes on Friday

## 2016-07-27 NOTE — Assessment & Plan Note (Addendum)
Increase Losartan to 50 mg daily. Encouraged heart healthy diet such as the DASH diet and exercise as tolerated. Feels well but her K keeps going down. Will d/c Chlorthalidone and increase Losartan to 50 mg daily

## 2016-07-27 NOTE — Patient Instructions (Addendum)
Effective immediately your Losartan (Cozarr) has been increased to 68m (Take 1 tablet by mouth daily). Encouraged to incorporate elderberry, zinc, black aged garlic into your daily diet. Encouraged to have 3 servings of calcium in your daily diet. Preventive Care 76Years and Older, Female Preventive care refers to lifestyle choices and visits with your health care provider that can promote health and wellness. What does preventive care include?  A yearly physical exam. This is also called an annual well check.  Dental exams once or twice a year.  Routine eye exams. Ask your health care provider how often you should have your eyes checked.  Personal lifestyle choices, including:  Daily care of your teeth and gums.  Regular physical activity.  Eating a healthy diet.  Avoiding tobacco and drug use.  Limiting alcohol use.  Practicing safe sex.  Taking low-dose aspirin every day.  Taking vitamin and mineral supplements as recommended by your health care provider. What happens during an annual well check? The services and screenings done by your health care provider during your annual well check will depend on your age, overall health, lifestyle risk factors, and family history of disease. Counseling  Your health care provider may ask you questions about your:  Alcohol use.  Tobacco use.  Drug use.  Emotional well-being.  Home and relationship well-being.  Sexual activity.  Eating habits.  History of falls.  Memory and ability to understand (cognition).  Work and work eStatistician  Reproductive health. Screening  You may have the following tests or measurements:  Height, weight, and BMI.  Blood pressure.  Lipid and cholesterol levels. These may be checked every 5 years, or more frequently if you are over 76years old.  Skin check.  Lung cancer screening. You may have this screening every year starting at age 7690if you have a 30-pack-year history of smoking  and currently smoke or have quit within the past 15 years.  Fecal occult blood test (FOBT) of the stool. You may have this test every year starting at age 76  Flexible sigmoidoscopy or colonoscopy. You may have a sigmoidoscopy every 5 years or a colonoscopy every 10 years starting at age 76  Hepatitis C blood test.  Hepatitis B blood test.  Sexually transmitted disease (STD) testing.  Diabetes screening. This is done by checking your blood sugar (glucose) after you have not eaten for a while (fasting). You may have this done every 1-3 years.  Bone density scan. This is done to screen for osteoporosis. You may have this done starting at age 76342  Mammogram. This may be done every 1-2 years. Talk to your health care provider about how often you should have regular mammograms. Talk with your health care provider about your test results, treatment options, and if necessary, the need for more tests. Vaccines  Your health care provider may recommend certain vaccines, such as:  Influenza vaccine. This is recommended every year.  Tetanus, diphtheria, and acellular pertussis (Tdap, Td) vaccine. You may need a Td booster every 10 years.  Varicella vaccine. You may need this if you have not been vaccinated.  Zoster vaccine. You may need this after age 7672  Measles, mumps, and rubella (MMR) vaccine. You may need at least one dose of MMR if you were born in 1957 or later. You may also need a second dose.  Pneumococcal 13-valent conjugate (PCV13) vaccine. One dose is recommended after age 7676  Pneumococcal polysaccharide (PPSV23) vaccine. One dose is recommended after age 7676  Meningococcal vaccine. You may need this if you have certain conditions.  Hepatitis A vaccine. You may need this if you have certain conditions or if you travel or work in places where you may be exposed to hepatitis A.  Hepatitis B vaccine. You may need this if you have certain conditions or if you travel or work in  places where you may be exposed to hepatitis B.  Haemophilus influenzae type b (Hib) vaccine. You may need this if you have certain conditions. Talk to your health care provider about which screenings and vaccines you need and how often you need them. This information is not intended to replace advice given to you by your health care provider. Make sure you discuss any questions you have with your health care provider. Document Released: 04/19/2015 Document Revised: 12/11/2015 Document Reviewed: 01/22/2015 Elsevier Interactive Patient Education  2017 [employer].

## 2016-07-27 NOTE — Assessment & Plan Note (Signed)
Encouraged heart healthy diet, increase exercise, avoid trans fats, consider a krill oil cap daily 

## 2016-07-27 NOTE — Assessment & Plan Note (Signed)
Patient denies any difficulties at home. No trouble with ADLs, depression or falls. See EMR for functional status screen and depression screen. No recent changes to vision or hearing. Is UTD with immunizations. Is UTD with screening. Discussed Advanced Directives. Encouraged heart healthy diet, exercise as tolerated and adequate sleep. See patient's problem list for health risk factors to monitor. See AVS for preventative healthcare recommendation schedule. 

## 2016-07-27 NOTE — Assessment & Plan Note (Addendum)
Encouraged to get adequate exercise, calcium and vitamin d intake. She declines Dexa scan for now.. Continue Vitamin D and calcium supplements.

## 2016-07-27 NOTE — Assessment & Plan Note (Signed)
Cannot tolerate KCL causes nausea

## 2016-08-25 ENCOUNTER — Other Ambulatory Visit: Payer: Medicare Other

## 2016-08-25 ENCOUNTER — Other Ambulatory Visit: Payer: Self-pay | Admitting: Medical

## 2016-08-25 ENCOUNTER — Ambulatory Visit (INDEPENDENT_AMBULATORY_CARE_PROVIDER_SITE_OTHER): Payer: Medicare Other | Admitting: Family Medicine

## 2016-08-25 ENCOUNTER — Encounter: Payer: Self-pay | Admitting: Family Medicine

## 2016-08-25 DIAGNOSIS — I10 Essential (primary) hypertension: Secondary | ICD-10-CM

## 2016-08-25 LAB — COMPREHENSIVE METABOLIC PANEL
ALBUMIN: 3.9 g/dL (ref 3.5–5.2)
ALK PHOS: 70 U/L (ref 39–117)
ALT: 15 U/L (ref 0–35)
AST: 17 U/L (ref 0–37)
BILIRUBIN TOTAL: 0.7 mg/dL (ref 0.2–1.2)
BUN: 16 mg/dL (ref 6–23)
CALCIUM: 8.9 mg/dL (ref 8.4–10.5)
CO2: 28 mEq/L (ref 19–32)
CREATININE: 0.8 mg/dL (ref 0.40–1.20)
Chloride: 105 mEq/L (ref 96–112)
GFR: 74.15 mL/min (ref >60.00)
Glucose, Bld: 102 mg/dL — ABNORMAL HIGH (ref 70–99)
Potassium: 3.8 mEq/L (ref 3.5–5.1)
Sodium: 140 mEq/L (ref 135–145)
TOTAL PROTEIN: 6.3 g/dL (ref 6.0–8.3)

## 2016-08-25 NOTE — Patient Instructions (Addendum)
Per Dr. Carollee Herter: Continue current medication & regimen. Follow-up with PCP in 2 months (July), but if wheezing continues call the office sooner for an earlier appointment.

## 2016-08-25 NOTE — Progress Notes (Signed)
Pre visit review using our clinic review tool, if applicable. No additional management support is needed unless otherwise documented below in the visit note.  Patient came in the office for a blood pressure check. Verbal order given by Dr. Larose Kells. Patient reported compliance with medication, but having some fatigue & occasional wheezing (once a week, uses ProAir inhaler to resolve symptoms). Today's readings were as follow: BP 138/59 P 62 & BP 131/61 P 59.  Per Dr. Carollee Herter: Continue current medication & regimen. Follow-up with PCP in 2 months (July), but if wheezing continues call the office sooner for an earlier appointment.  Informed patient of the provider's recommendations. She verbalized understanding and did not have any further concerns.  Patient completed CMP lab work before leaving the office.   Reviewed Ann Held, DO

## 2016-09-03 ENCOUNTER — Other Ambulatory Visit: Payer: Self-pay | Admitting: *Deleted

## 2016-09-03 MED ORDER — FLUTICASONE PROPIONATE 50 MCG/ACT NA SUSP: NASAL | 1 refills | Status: DC

## 2016-09-03 NOTE — Progress Notes (Signed)
Faxed refill request received from [facility] for Flonase Refill sent per [facility] refill protocol/SLS

## 2016-09-08 DIAGNOSIS — L820 Inflamed seborrheic keratosis: Secondary | ICD-10-CM | POA: Diagnosis not present

## 2016-09-08 DIAGNOSIS — D0361 Melanoma in situ of right upper limb, including shoulder: Secondary | ICD-10-CM | POA: Diagnosis not present

## 2016-09-09 ENCOUNTER — Encounter: Payer: Self-pay | Admitting: Family Medicine

## 2016-09-10 ENCOUNTER — Telehealth: Payer: Self-pay | Admitting: Family Medicine

## 2016-09-10 NOTE — Telephone Encounter (Signed)
Pt called checking to see if Karen Hoffman is in the office to respond to her My Chart question she sent yesterday concerning side effects with her medication. Pt state she will wait for Karen Hoffman to contact her today.

## 2016-09-11 ENCOUNTER — Other Ambulatory Visit: Payer: Self-pay | Admitting: Family Medicine

## 2016-09-11 DIAGNOSIS — E876 Hypokalemia: Secondary | ICD-10-CM

## 2016-09-15 ENCOUNTER — Other Ambulatory Visit: Payer: Medicare Other

## 2016-09-17 ENCOUNTER — Ambulatory Visit (INDEPENDENT_AMBULATORY_CARE_PROVIDER_SITE_OTHER): Payer: Medicare Other | Admitting: Family Medicine

## 2016-09-17 ENCOUNTER — Other Ambulatory Visit (INDEPENDENT_AMBULATORY_CARE_PROVIDER_SITE_OTHER): Payer: Medicare Other

## 2016-09-17 VITALS — BP 145/61 | HR 57

## 2016-09-17 DIAGNOSIS — I10 Essential (primary) hypertension: Secondary | ICD-10-CM

## 2016-09-17 DIAGNOSIS — E876 Hypokalemia: Secondary | ICD-10-CM | POA: Diagnosis not present

## 2016-09-17 LAB — COMPREHENSIVE METABOLIC PANEL
ALBUMIN: 4.1 g/dL (ref 3.5–5.2)
ALK PHOS: 80 U/L (ref 39–117)
ALT: 21 U/L (ref 0–35)
AST: 18 U/L (ref 0–37)
BUN: 14 mg/dL (ref 6–23)
CO2: 30 mEq/L (ref 19–32)
CREATININE: 0.85 mg/dL (ref 0.40–1.20)
Calcium: 9.3 mg/dL (ref 8.4–10.5)
Chloride: 105 mEq/L (ref 96–112)
GFR: 69.13 mL/min (ref >60.00)
GLUCOSE: 96 mg/dL (ref 70–99)
POTASSIUM: 3.8 meq/L (ref 3.5–5.1)
SODIUM: 141 meq/L (ref 135–145)
TOTAL PROTEIN: 6.6 g/dL (ref 6.0–8.3)
Total Bilirubin: 0.7 mg/dL (ref 0.2–1.2)

## 2016-09-17 NOTE — Patient Instructions (Addendum)
Per Dr. Charlett Blake: Increase Losartan (Cozaar) to 25 MG twice daily. Call the office with any new symptoms. Keep follow-up appointment with PCP on 10/30/16 at 7:45 AM.

## 2016-09-17 NOTE — Progress Notes (Signed)
Pre visit review using our clinic review tool, if applicable. No additional management support is needed unless otherwise documented below in the visit note.  Patient presents in office for blood pressure recheck per patient e-mail note on 09/09/16. She voices adherence to medication & regimen. Patient denies chest pain, dizziness & headache. Readings during today's visit were: BP 138/56 P 58 & BP 145/61 P 57. Patient completed CMP lab work prior to the visit.  Per Dr. Charlett Blake: Increase Losartan (Cozaar) to 25 MG twice daily. Call the office with any new symptoms. Keep follow-up appointment with PCP on 10/30/16 at 7:45 AM.  Patient was made aware of the provider's recommendations & verbalized understanding.    RN blood pressure check note reviewed. Agree with documention and plan.

## 2016-09-18 NOTE — Progress Notes (Signed)
RN blood pressure check note reviewed. Agree with documention and plan. 

## 2016-09-22 ENCOUNTER — Other Ambulatory Visit: Payer: Self-pay | Admitting: Family Medicine

## 2016-09-22 MED ORDER — LOSARTAN POTASSIUM 25 MG PO TABS: 25.0000 mg | ORAL_TABLET | Freq: Two times a day (BID) | ORAL | 4 refills | Status: DC

## 2016-09-25 ENCOUNTER — Other Ambulatory Visit: Payer: Self-pay

## 2016-09-25 MED ORDER — LOSARTAN POTASSIUM 50 MG PO TABS: 25.0000 mg | ORAL_TABLET | Freq: Two times a day (BID) | ORAL | 1 refills | Status: DC

## 2016-09-28 DIAGNOSIS — H401123 Primary open-angle glaucoma, left eye, severe stage: Secondary | ICD-10-CM | POA: Diagnosis not present

## 2016-09-30 DIAGNOSIS — L905 Scar conditions and fibrosis of skin: Secondary | ICD-10-CM | POA: Diagnosis not present

## 2016-09-30 DIAGNOSIS — D0361 Melanoma in situ of right upper limb, including shoulder: Secondary | ICD-10-CM | POA: Diagnosis not present

## 2016-10-06 ENCOUNTER — Telehealth: Payer: Self-pay | Admitting: *Deleted

## 2016-10-06 NOTE — Telephone Encounter (Signed)
Received Pathology Report from [facility], forwarded to provider/SLS 07/03

## 2016-10-19 ENCOUNTER — Encounter: Payer: Self-pay | Admitting: Family Medicine

## 2016-10-23 ENCOUNTER — Telehealth: Payer: Self-pay | Admitting: Family

## 2016-10-23 MED ORDER — AMLODIPINE BESYLATE 5 MG PO TABS: 5.0000 mg | ORAL_TABLET | Freq: Every day | ORAL | 3 refills | Status: DC

## 2016-10-23 NOTE — Telephone Encounter (Signed)
Called to follow up with patient.  Pt states since she started taking losartan she has been experiencing an increase in bronchitis flares.  Now that Dr. Charlett Blake has increased the dose, she is now experiencing the chest tightness, shortness of breath and weakness every day.  Symptoms are relieved once she takes her albuterol (she's taking it every 6 hours), however, pt states she does not want to go through this every day.  She has an appt schedule with Dr. Charlett Blake on the 27th, however given patient's symptoms she was advised to be seen sooner.  Pt would prefer to only see Dr. Charlett Blake, since she's more familiar with her history and the medications tried before the in past.  Pt did not sound to be in acute distress over the phone.    Discussed with Dr. Nani Ravens (Doc of the day).  He agreed that patient needed to be seen sooner.  Pt states she has been experiencing these symptoms for quite some time now, since June when she came in for Blood pressure check on 09/25/16.  Pt states she wish Dr. Charlett Blake would have gotten to the [place] sooner, but since she didn't, she will see her on the 27th.  Pt was advised, if she changes her mind to call back for an appt.  If symptoms worsen to go to the ER.  Pt stated understanding agreed to comply.

## 2016-10-23 NOTE — Telephone Encounter (Signed)
She reports feeling "tight in the chest." Doesn't feel like her heart. "Feels like my breathing." Started after she began losartan. Symptoms relieve with use of albuterol.  Reports that she felt OK on the 25mg .  Reports that she has no chest pain at all. Denies LE edema.   BP Readings from Last 3 Encounters:  09/17/16 (!) 145/61  08/25/16 131/61  07/27/16 131/72    Advised pt as follows:  d/c losartan.  Start amlodipine 5mg . Follow up as scheduled next Friday with Dr. Charlett Blake. Go to ER if worsening chest tightness or if chest pain- pt verbalizes understanding.

## 2016-10-30 ENCOUNTER — Encounter: Payer: Self-pay | Admitting: Family Medicine

## 2016-10-30 ENCOUNTER — Ambulatory Visit (INDEPENDENT_AMBULATORY_CARE_PROVIDER_SITE_OTHER): Payer: Medicare Other | Admitting: Family Medicine

## 2016-10-30 DIAGNOSIS — T7840XD Allergy, unspecified, subsequent encounter: Secondary | ICD-10-CM | POA: Diagnosis not present

## 2016-10-30 DIAGNOSIS — Z8619 Personal history of other infectious and parasitic diseases: Secondary | ICD-10-CM | POA: Diagnosis not present

## 2016-10-30 DIAGNOSIS — E559 Vitamin D deficiency, unspecified: Secondary | ICD-10-CM | POA: Diagnosis not present

## 2016-10-30 DIAGNOSIS — I10 Essential (primary) hypertension: Secondary | ICD-10-CM | POA: Diagnosis not present

## 2016-10-30 DIAGNOSIS — J45909 Unspecified asthma, uncomplicated: Secondary | ICD-10-CM

## 2016-10-30 NOTE — Assessment & Plan Note (Signed)
Encouraged to take Shingrix

## 2016-10-30 NOTE — Assessment & Plan Note (Signed)
Is still wheezing slightly still will have her do Albuterol bid and prn, Mucinex twice daiily and Loratadine twice daily. If symptoms worsen proceed with CXR

## 2016-10-30 NOTE — Progress Notes (Signed)
Subjective:  I acted as a Education administrator for Dr. Charlett Blake. Theoplis Garciagarcia, [place]  Patient ID: Karen Hoffman, female    DOB: [birth year], 76 y.o.   MRN: [id number]  Chief Complaint  Patient presents with  . Follow-up  . Hypertension    HPI  Patient is in today for a hypertension follow up. Patient does not presently record readings at home. She was recently switched  her HTN medication to amlodipine from losartan, she states when on losartan she had chest pain presently denies chest pain, however she complains of chest congestion. No fevers, chills, or acute concerns. Now using Albuterol only every couple of days but does note some wheezing. Denies CP/palp/SOB/HA/fevers/GI or GU c/o. Taking meds as prescribed  Patient Care Team: Mosie Lukes, MD as PCP - General (Family Medicine) Audrie Lia, MD (Dermatology) Mosie Lukes, MD as Consulting Physician (Gynecology) Despina Hick, MD as Consulting Physician (Ophthalmology)   Past Medical History:  Diagnosis Date  . Allergy    improved since move to [place]  . Asthma    mild, intermittent, improved since moving to [place]  . Carcinoma in situ of skin of back   . Cataracts, bilateral   . Cervical cancer screening 06/19/2013  . Elevated BP   . Elevated LFTs 07/02/2010  . Glaucoma   . History of chicken pox 07/02/2010  . History of measles 07/02/2010  . HTN (hypertension) 08/06/2010  . Hyperlipidemia 09/04/2010  . Hypokalemia 11/18/2011  . 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 Medicare annual wellness visit, subsequent 04/21/2011  . Osteoporosis   . Overweight(278.02) 07/02/2010  . Thyroid disease 07/02/2010  . URI (upper respiratory infection) 11/17/2011  . Vitamin D deficiency 06/27/2015    Past Surgical History:  Procedure Laterality Date  . broken ankle  1997   right, screws and plates in place  . left eye surgery    . TONSILLECTOMY    . TRUNK SKIN LESION EXCISIONAL BIOPSY     x twice    Family History  Problem Relation Age of Onset    . Cancer Mother        lung/ smoker  . Heart attack Paternal Grandmother   . Heart disease Father   . Hypertension Father   . Kidney disease Father 20       dialysis  . Obesity Father   . Diabetes type II Father   . Graves' disease Daughter   . Hypothyroidism Daughter        h/o Hashimoto's now hypothyroid  . Cholecystitis Son 12       cholecystectomy    Social History   Social History  . Marital status: Widowed    Spouse name: N/A  . Number of children: N/A  . Years of education: N/A   Occupational History  . Not on file.   Social History Main Topics  . Smoking status: Former Smoker    Packs/day: 0.20    Years: 10.00    Types: Cigarettes    Quit date: 04/07/1975  . Smokeless tobacco: Never Used  . Alcohol use Yes     Comment: 1 glass of wine or coctail with dinner  . Drug use: No  . Sexual activity: No     Comment: lives with dog, no major dietary restrictions, wears seat  belt   Other Topics Concern  . Not on file   Social History Narrative  . No narrative on file    Outpatient Medications Prior  to Visit  Medication Sig Dispense Refill  . albuterol (PROAIR HFA) 108 (90 Base) MCG/ACT inhaler INHALE 2 PUFFS INTO THE LUNGS EVERY 6 HOURS AS NEEDED FOR WHEEZING OR SHORTNESS OF BREATH 8.5 Inhaler 3  . amLODipine (NORVASC) 5 MG tablet Take 1 tablet (5 mg total) by mouth daily. 30 tablet 3  . benzonatate (TESSALON) 100 MG capsule Take 1 capsule (100 mg total) by mouth 3 (three) times daily as needed for cough. 21 capsule 0  . Calcium Citrate-Vitamin D (CALCIUM CITRATE + PO) Take 1 tablet by mouth daily.    . dorzolamide-timolol (COSOPT) 22.3-6.8 MG/ML ophthalmic solution Place 1 drop into both eyes 2 (two) times daily.   5  . doxycycline (VIBRA-TABS) 100 MG tablet Take 1 tablet (100 mg total) by mouth 2 (two) times daily. Caps or generic ok. 20 tablet 0  . Flaxseed, Linseed, (FLAXSEED OIL) 1000 MG CAPS Take 1 capsule by mouth daily.    . fluticasone (FLONASE) 50  MCG/ACT nasal spray SPRAY TWICE INTO BOTH NOSTRILS EVERY DAY 16 g 1  . latanoprost (XALATAN) 0.005 % ophthalmic solution Place 1 drop into both eyes at bedtime.   5  . loratadine (CLARITIN) 10 MG tablet Take 10 mg by mouth daily.    . milk thistle 175 MG tablet Take 250 mg by mouth daily.    . Multiple Vitamins-Minerals (CENTRUM ADULTS PO) Take 1 tablet by mouth daily.    . potassium chloride SA (K-DUR,KLOR-CON) 20 MEQ tablet Take 20 mEq by mouth 2 (two) times daily. Take 2 tabs po x3 days, then 1 tab po prn.    . predniSONE (DELTASONE) 10 MG tablet 6 TAB PO DAY 1 5 TAB PO DAY 2 4 TAB PO DAY 3 3 TAB PO DAY 4 2 TAB PO DAY 5 1 TAB PO DAY 6 21 tablet 0  . Probiotic Product (ALIGN PO) Take 1 capsule by mouth daily.    . timolol (TIMOPTIC) 0.5 % ophthalmic solution 1 drop 2 (two) times daily.    . vitamin C (ASCORBIC ACID) 500 MG tablet Take 500 mg by mouth daily.     No facility-administered medications prior to visit.     Allergies  Allergen Reactions  . Brimonidine Tartrate-Timolol Other (See Comments)    Pt. reports eye redness & itchiness.  . Losartan     Shortness of breath   . Ace Inhibitors Cough    Review of Systems  Constitutional: Negative for fever and malaise/fatigue.  HENT: Positive for congestion.   Eyes: Negative for blurred vision.  Respiratory: Positive for cough, sputum production and wheezing. Negative for shortness of breath.   Cardiovascular: Negative for chest pain, palpitations and leg swelling.  Gastrointestinal: Negative for vomiting.  Musculoskeletal: Negative for back pain.  Skin: Negative for rash.  Neurological: Negative for loss of consciousness and headaches.       Objective:    Physical Exam  Constitutional: She is oriented to person, place, and time. She appears well-developed and well-nourished. No distress.  HENT:  Head: Normocephalic and atraumatic.  Eyes: Conjunctivae are normal.  Neck: Normal range of motion. No thyromegaly present.    Cardiovascular: Normal rate and regular rhythm.   Pulmonary/Chest: Effort normal. She has wheezes.  Abdominal: Soft. Bowel sounds are normal. There is no tenderness.  Musculoskeletal: Normal range of motion. She exhibits no edema or deformity.  Neurological: She is alert and oriented to person, place, and time.  Skin: Skin is warm and dry. She is not diaphoretic.  Psychiatric: She has a normal mood and affect.    BP 138/82 (BP Location: Left Arm, Patient Position: Sitting, Cuff Size: Normal)   Pulse (!) 53   Temp 98.1 F (36.7 C) (Oral)   Resp 18   Wt 180 lb 3.2 oz (81.7 kg)   SpO2 97%   BMI 29.99 kg/m  Wt Readings from Last 3 Encounters:  10/30/16 180 lb 3.2 oz (81.7 kg)  07/27/16 181 lb 12.8 oz (82.5 kg)  05/22/16 178 lb 6.4 oz (80.9 kg)   BP Readings from Last 3 Encounters:  10/30/16 138/82  09/17/16 (!) 145/61  08/25/16 131/61     Immunization History  Administered Date(s) Administered  . Influenza Split 12/28/2011, 01/29/2016  . Influenza Whole 01/05/2011  . Influenza,inj,Quad PF,36+ Mos 12/26/2012, 12/14/2013  . Influenza-Unspecified 02/06/2015  . Pneumococcal Conjugate-13 06/19/2013  . Pneumococcal Polysaccharide-23 04/06/2005, 06/27/2015  . Tdap 04/21/2011  . Zoster 11/05/2011    Health Maintenance  Topic Date Due  . DEXA SCAN  06/06/2018 (Originally 10/18/2005)  . INFLUENZA VACCINE  11/04/2016  . TETANUS/TDAP  04/20/2021  . PNA vac Low Risk Adult  Completed    Lab Results  Component Value Date   WBC 8.2 07/24/2016   HGB 14.3 07/24/2016   HCT 42.3 07/24/2016   PLT 214.0 07/24/2016   GLUCOSE 96 09/17/2016   CHOL 200 07/24/2016   TRIG 107.0 07/24/2016   HDL 54.70 07/24/2016   LDLDIRECT 131.6 12/12/2012   LDLCALC 124 (H) 07/24/2016   ALT 21 09/17/2016   AST 18 09/17/2016   NA 141 09/17/2016   K 3.8 09/17/2016   CL 105 09/17/2016   CREATININE 0.85 09/17/2016   BUN 14 09/17/2016   CO2 30 09/17/2016   TSH 2.79 07/24/2016    Lab Results   Component Value Date   TSH 2.79 07/24/2016   Lab Results  Component Value Date   WBC 8.2 07/24/2016   HGB 14.3 07/24/2016   HCT 42.3 07/24/2016   MCV 90.6 07/24/2016   PLT 214.0 07/24/2016   Lab Results  Component Value Date   NA 141 09/17/2016   K 3.8 09/17/2016   CO2 30 09/17/2016   GLUCOSE 96 09/17/2016   BUN 14 09/17/2016   CREATININE 0.85 09/17/2016   BILITOT 0.7 09/17/2016   ALKPHOS 80 09/17/2016   AST 18 09/17/2016   ALT 21 09/17/2016   PROT 6.6 09/17/2016   ALBUMIN 4.1 09/17/2016   CALCIUM 9.3 09/17/2016   GFR 69.13 09/17/2016   Lab Results  Component Value Date   CHOL 200 07/24/2016   Lab Results  Component Value Date   HDL 54.70 07/24/2016   Lab Results  Component Value Date   LDLCALC 124 (H) 07/24/2016   Lab Results  Component Value Date   TRIG 107.0 07/24/2016   Lab Results  Component Value Date   CHOLHDL 4 07/24/2016   No results found for: HGBA1C       Assessment & Plan:   Problem List Items Addressed This Visit    None      I am having Ms. Saxby maintain her dorzolamide-timolol, latanoprost, vitamin C, Calcium Citrate-Vitamin D (CALCIUM CITRATE + PO), Flaxseed Oil, milk thistle, Multiple Vitamins-Minerals (CENTRUM ADULTS PO), Probiotic Product (ALIGN PO), timolol, loratadine, albuterol, doxycycline, benzonatate, predniSONE, potassium chloride SA, fluticasone, and amLODipine.  No orders of the defined types were placed in this encounter.   CMA served as Education administrator during this visit. History, Physical and Plan performed by medical provider. Documentation and orders  reviewed and attested to.  Magdalene Molly, [place]

## 2016-10-30 NOTE — Assessment & Plan Note (Signed)
Taking supplements

## 2016-10-30 NOTE — Assessment & Plan Note (Signed)
Breathing better since stopping Losartan. Is tolerating A,lodipine, no changes

## 2016-10-30 NOTE — Assessment & Plan Note (Signed)
Loratadine twice daily

## 2016-10-30 NOTE — Patient Instructions (Signed)
Shingrix 2 shots over 6 months., protects against the shingles Cough, Adult A cough helps to clear your throat and lungs. A cough may last only 2-3 weeks (acute), or it may last longer than 8 weeks (chronic). Many different things can cause a cough. A cough may be a sign of an illness or another medical condition. Follow these instructions at home:  Pay attention to any changes in your cough.  Take medicines only as told by your doctor. ? If you were prescribed an antibiotic medicine, take it as told by your doctor. Do not stop taking it even if you start to feel better. ? Talk with your doctor before you try using a cough medicine.  Drink enough fluid to keep your pee (urine) clear or pale yellow.  If the air is dry, use a cold steam vaporizer or humidifier in your home.  Stay away from things that make you cough at work or at home.  If your cough is worse at night, try using extra pillows to raise your head up higher while you sleep.  Do not smoke, and try not to be around smoke. If you need help quitting, ask your doctor.  Do not have caffeine.  Do not drink alcohol.  Rest as needed. Contact a doctor if:  You have new problems (symptoms).  You cough up yellow fluid (pus).  Your cough does not get better after 2-3 weeks, or your cough gets worse.  Medicine does not help your cough and you are not sleeping well.  You have pain that gets worse or pain that is not helped with medicine.  You have a fever.  You are losing weight and you do not know why.  You have night sweats. Get help right away if:  You cough up blood.  You have trouble breathing.  Your heartbeat is very fast. This information is not intended to replace advice given to you by your health care provider. Make sure you discuss any questions you have with your health care provider. Document Released: 12/04/2010 Document Revised: 08/29/2015 Document Reviewed: 05/30/2014 Elsevier Interactive Patient  Education  [employer].

## 2016-11-16 ENCOUNTER — Telehealth: Payer: Self-pay | Admitting: Family Medicine

## 2016-11-16 NOTE — Telephone Encounter (Signed)
Caller name: Andreina Outten Relationship to patient: self Can be reached: [phone] Pharmacy: [id number] - [place], [place]  Reason for call: Pt states that she has Proair inhaler and it is ordered Q6H but pt is having to use it every 3-4 hours. She states she needs something longer lasting. She cannot go out and walk at the park. It is affecting her life.

## 2016-11-16 NOTE — Telephone Encounter (Signed)
Please advise on newer long lasting inhaler.   PC

## 2016-11-17 NOTE — Telephone Encounter (Signed)
Pt calling in again to follow up. She states she is concerned about breathing. Pt states she hates to wait.

## 2016-11-17 NOTE — Telephone Encounter (Signed)
Spoke with patient and let her know that is she can not pass air she needs to got the nearest ER, she stated she can breathe and she just needed to be seen. She agreed to see Percell Miller on 8/15 to have a referral to pulmonology.   pC

## 2016-11-18 ENCOUNTER — Ambulatory Visit (INDEPENDENT_AMBULATORY_CARE_PROVIDER_SITE_OTHER): Payer: Medicare Other | Admitting: Medical

## 2016-11-18 ENCOUNTER — Ambulatory Visit (HOSPITAL_BASED_OUTPATIENT_CLINIC_OR_DEPARTMENT_OTHER)
Admission: RE | Admit: 2016-11-18 | Discharge: 2016-11-18 | Disposition: A | Discharge status: Home / Self Care | Payer: Medicare Other | Source: Ambulatory Visit | Attending: Medical | Admitting: Medical

## 2016-11-18 DIAGNOSIS — R062 Wheezing: Secondary | ICD-10-CM | POA: Insufficient documentation

## 2016-11-18 DIAGNOSIS — R0602 Shortness of breath: Secondary | ICD-10-CM | POA: Diagnosis not present

## 2016-11-18 DIAGNOSIS — J40 Bronchitis, not specified as acute or chronic: Secondary | ICD-10-CM | POA: Diagnosis not present

## 2016-11-18 DIAGNOSIS — R059 Cough, unspecified: Secondary | ICD-10-CM

## 2016-11-18 DIAGNOSIS — R05 Cough: Secondary | ICD-10-CM | POA: Insufficient documentation

## 2016-11-18 MED ORDER — IPRATROPIUM-ALBUTEROL 0.5-2.5 (3) MG/3ML IN SOLN
3.0000 mL | Freq: Once | RESPIRATORY_TRACT | Status: AC
  Administered 2016-11-18: 3 mL via RESPIRATORY_TRACT

## 2016-11-18 MED ORDER — BENZONATATE 100 MG PO CAPS: 100.0000 mg | ORAL_CAPSULE | Freq: Three times a day (TID) | ORAL | 0 refills | Status: DC | PRN

## 2016-11-18 MED ORDER — DOXYCYCLINE HYCLATE 100 MG PO TABS: 100.0000 mg | ORAL_TABLET | Freq: Two times a day (BID) | ORAL | 0 refills | Status: DC

## 2016-11-18 MED ORDER — METHYLPREDNISOLONE ACETATE 40 MG/ML IJ SUSP
40.0000 mg | Freq: Once | INTRAMUSCULAR | Status: AC
  Administered 2016-11-18: 40 mg via INTRAMUSCULAR

## 2016-11-18 MED ORDER — IPRATROPIUM-ALBUTEROL 0.5-2.5 (3) MG/3ML IN SOLN: 3.0000 mL | Freq: Once | RESPIRATORY_TRACT | 0 refills | Status: DC

## 2016-11-18 MED ORDER — IPRATROPIUM-ALBUTEROL 0.5-2.5 (3) MG/3ML IN SOLN
3.0000 mL | Freq: Four times a day (QID) | RESPIRATORY_TRACT | Status: DC
  Administered 2016-11-18: 3 mL via RESPIRATORY_TRACT

## 2016-11-18 MED ORDER — PREDNISONE 10 MG PO TABS: ORAL_TABLET | ORAL | 0 refills | Status: DC

## 2016-11-18 NOTE — Progress Notes (Signed)
Subjective:    Patient ID: Karen Hoffman, female    DOB: 05-24-1940, 76 y.o.   MRN: [id number]  HPI  Pt in for evaluation for chest congestion.   She states this came on gradually as chest congestion over past 2 weeks. Not a lot of nasal congestion. Some pnd.  Pt states  recent chest congestion, with sob, and some wheezing. Over last week got worse. Some productive cough.  Pt has been using albuterol every 4- 6 hours for at least a week or more.   Pt has been coughing up mucous. For past week. No fever, no chills or sweats.   Very remote hx of smoking in the 70's.   In past pt just used albuterol during bronchitis.   Review of Systems  Constitutional: Negative for chills, fatigue and fever.  Respiratory: Positive for cough, shortness of breath and wheezing. Negative for chest tightness.   Cardiovascular: Negative for chest pain and palpitations.  Gastrointestinal: Negative for abdominal pain.  Musculoskeletal: Negative for back pain.  Neurological: Negative for dizziness, speech difficulty, numbness and headaches.  Hematological: Negative for adenopathy. Does not bruise/bleed easily.    Past Medical History:  Diagnosis Date  . Allergy    improved since move to [place]  . Asthma    mild, intermittent, improved since moving to [place]  . Carcinoma in situ of skin of back   . Cataracts, bilateral   . Cervical cancer screening 06/19/2013  . Elevated BP   . Elevated LFTs 07/02/2010  . Glaucoma   . History of chicken pox 07/02/2010  . History of measles 07/02/2010  . HTN (hypertension) 08/06/2010  . Hyperlipidemia 09/04/2010  . Hypokalemia 11/18/2011  . 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 Medicare annual wellness visit, subsequent 04/21/2011  . Osteoporosis   . Overweight(278.02) 07/02/2010  . Thyroid disease 07/02/2010  . URI (upper respiratory infection) 11/17/2011  . Vitamin D deficiency 06/27/2015     Social History   Social History  . Marital status: Widowed   Spouse name: N/A  . Number of children: N/A  . Years of education: N/A   Occupational History  . Not on file.   Social History Main Topics  . Smoking status: Former Smoker    Packs/day: 0.20    Years: 10.00    Types: Cigarettes    Quit date: 04/07/1975  . Smokeless tobacco: Never Used  . Alcohol use Yes     Comment: 1 glass of wine or coctail with dinner  . Drug use: No  . Sexual activity: No     Comment: lives with dog, no major dietary restrictions, wears seat  belt   Other Topics Concern  . Not on file   Social History Narrative  . No narrative on file    Past Surgical History:  Procedure Laterality Date  . broken ankle  1997   right, screws and plates in place  . left eye surgery    . TONSILLECTOMY    . TRUNK SKIN LESION EXCISIONAL BIOPSY     x twice    Family History  Problem Relation Age of Onset  . Cancer Mother        lung/ smoker  . Heart attack Paternal Grandmother   . Heart disease Father   . Hypertension Father   . Kidney disease Father 46       dialysis  . Obesity Father   . Diabetes type II Father   . Graves'  disease Daughter   . Hypothyroidism Daughter        h/o Hashimoto's now hypothyroid  . Cholecystitis Son 51       cholecystectomy    Allergies  Allergen Reactions  . Brimonidine Tartrate-Timolol Other (See Comments)    Pt. reports eye redness & itchiness.  . Losartan     Shortness of breath   . Ace Inhibitors Cough    Current Outpatient Prescriptions on File Prior to Visit  Medication Sig Dispense Refill  . albuterol (PROAIR HFA) 108 (90 Base) MCG/ACT inhaler INHALE 2 PUFFS INTO THE LUNGS EVERY 6 HOURS AS NEEDED FOR WHEEZING OR SHORTNESS OF BREATH 8.5 Inhaler 3  . amLODipine (NORVASC) 5 MG tablet Take 1 tablet (5 mg total) by mouth daily. 30 tablet 3  . benzonatate (TESSALON) 100 MG capsule Take 1 capsule (100 mg total) by mouth 3 (three) times daily as needed for cough. 21 capsule 0  . Calcium Citrate-Vitamin D (CALCIUM CITRATE +  PO) Take 1 tablet by mouth daily.    . dorzolamide-timolol (COSOPT) 22.3-6.8 MG/ML ophthalmic solution Place 1 drop into both eyes 2 (two) times daily.   5  . Flaxseed, Linseed, (FLAXSEED OIL) 1000 MG CAPS Take 1 capsule by mouth daily.    . fluticasone (FLONASE) 50 MCG/ACT nasal spray SPRAY TWICE INTO BOTH NOSTRILS EVERY DAY 16 g 1  . latanoprost (XALATAN) 0.005 % ophthalmic solution Place 1 drop into both eyes at bedtime.   5  . loratadine (CLARITIN) 10 MG tablet Take 10 mg by mouth daily.    . milk thistle 175 MG tablet Take 250 mg by mouth daily.    . Multiple Vitamins-Minerals (CENTRUM ADULTS PO) Take 1 tablet by mouth daily.    . potassium chloride SA (K-DUR,KLOR-CON) 20 MEQ tablet Take 20 mEq by mouth 2 (two) times daily. Take 2 tabs po x3 days, then 1 tab po prn.    . Probiotic Product (ALIGN PO) Take 1 capsule by mouth daily.    . timolol (TIMOPTIC) 0.5 % ophthalmic solution 1 drop 2 (two) times daily.    . vitamin C (ASCORBIC ACID) 500 MG tablet Take 500 mg by mouth daily.     No current facility-administered medications on file prior to visit.     There were no vitals taken for this visit.      Objective:   Physical Exam  Mental Status - Alert. General Appearance - Well groomed. Not in acute distress.  Skin Rashes- No Rashes.  HEENT Head- Normal. Ear Auditory Canal - Left- Normal. Right - Normal.Tympanic Membrane- Left- Normal. Right- Normal. Eye Sclera/Conjunctiva- Left- Normal. Right- Normal. Nose & Sinuses Nasal Mucosa- Left-  Boggy and Congested. Right-  Boggy and  Congested.Bilateral no  maxillary and  No frontal sinus pressure. Mouth & Throat Lips: Upper Lip- Normal: no dryness, cracking, pallor, cyanosis, or vesicular eruption. Lower Lip-Normal: no dryness, cracking, pallor, cyanosis or vesicular eruption. Buccal Mucosa- Bilateral- No Aphthous ulcers. Oropharynx- No Discharge or Erythema. Tonsils: Characteristics- Bilateral- No Erythema or Congestion.  Size/Enlargement- Bilateral- No enlargement. Discharge- bilateral-None.  Neck Neck- Supple. No Masses.   Chest and Lung Exam Auscultation: Breath Sounds:-even and unlabored. But diffuse coarse breath sounds throughout  Cardiovascular Auscultation:Rythm- Regular, rate and rhythm. Murmurs & Other Heart Sounds:Ausculatation of the heart reveal- No Murmurs.  Lymphatic Head & Neck General Head & Neck Lymphatics: Bilateral: Description- No Localized lymphadenopathy.  Lower ext- no edema. Negative homans sign.     Assessment & Plan:  Depomedrol  40 mg im and duo neb tx in office.  You appear to have bronchitis with wheezing. Rest hydrate and tylenol for fever. I am prescribing cough medicine benzonatate, and doxycycline antibiotic.  For wheezing taper dose prednisone and use proair if needed.  Get cxr today  Follow up in 7-10 days or as needed  Toretto Tingler, Percell Miller, [place]

## 2016-11-18 NOTE — Patient Instructions (Addendum)
Depomedrol 40 mg im and duo neb tx in office.  You appear to have bronchitis with wheezing. Rest hydrate and tylenol for fever. I am prescribing cough medicine benzonatate, and doxycycline antibiotic.  For wheezing taper dose prednisone and use proair if needed.  Get cxr today  Follow up in 7-10 days or as needed.   As we approach weekend if you feel worse instead of better please notify us.

## 2016-12-11 ENCOUNTER — Ambulatory Visit: Payer: Medicare Other | Admitting: Family Medicine

## 2016-12-14 ENCOUNTER — Ambulatory Visit (INDEPENDENT_AMBULATORY_CARE_PROVIDER_SITE_OTHER): Payer: Medicare Other | Admitting: Family Medicine

## 2016-12-14 ENCOUNTER — Encounter: Payer: Self-pay | Admitting: Family Medicine

## 2016-12-14 VITALS — BP 140/67 | HR 60 | Temp 98.6°F | Ht 65.0 in | Wt 180.0 lb

## 2016-12-14 DIAGNOSIS — E782 Mixed hyperlipidemia: Secondary | ICD-10-CM

## 2016-12-14 DIAGNOSIS — M810 Age-related osteoporosis without current pathological fracture: Secondary | ICD-10-CM | POA: Diagnosis not present

## 2016-12-14 DIAGNOSIS — Z23 Encounter for immunization: Secondary | ICD-10-CM | POA: Diagnosis not present

## 2016-12-14 DIAGNOSIS — I10 Essential (primary) hypertension: Secondary | ICD-10-CM | POA: Diagnosis not present

## 2016-12-14 DIAGNOSIS — E785 Hyperlipidemia, unspecified: Secondary | ICD-10-CM

## 2016-12-14 DIAGNOSIS — Z0000 Encounter for general adult medical examination without abnormal findings: Secondary | ICD-10-CM | POA: Diagnosis not present

## 2016-12-14 NOTE — Progress Notes (Addendum)
Subjective:  I acted as a Education administrator for Dr. Charlett Blake. [place], [place]  Patient ID: Karen Hoffman, female    DOB: [birth year], 76 y.o.   MRN: [id number]  Chief Complaint  Patient presents with  . Follow-up  . Hypertension    HPI  Patient is in today for an annual exam, patient has no acute concerns. She feels well today. No recent febrile illness or hospitalization. Has been trying to eat a heart healthy diet and stay active. Denies CP/palp/SOB/HA/congestion/fevers/GI or GU c/o. Taking meds as prescribed  Patient Care Team: Mosie Lukes, MD as PCP - General (Family Medicine) Audrie Lia, MD (Dermatology) Mosie Lukes, MD as Consulting Physician (Gynecology) Despina Hick, MD as Consulting Physician (Ophthalmology)   Past Medical History:  Diagnosis Date  . Allergy    improved since move to [place]  . Asthma    mild, intermittent, improved since moving to [place]  . Carcinoma in situ of skin of back   . Cataracts, bilateral   . Cervical cancer screening 06/19/2013  . Elevated BP   . Elevated LFTs 07/02/2010  . Glaucoma   . History of chicken pox 07/02/2010  . History of measles 07/02/2010  . HTN (hypertension) 08/06/2010  . Hyperlipidemia 09/04/2010  . Hypokalemia 11/18/2011  . 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 Medicare annual wellness visit, subsequent 04/21/2011  . Osteoporosis   . Overweight(278.02) 07/02/2010  . Thyroid disease 07/02/2010  . URI (upper respiratory infection) 11/17/2011  . Vitamin D deficiency 06/27/2015    Past Surgical History:  Procedure Laterality Date  . broken ankle  1997   right, screws and plates in place  . left eye surgery    . TONSILLECTOMY    . TRUNK SKIN LESION EXCISIONAL BIOPSY     x twice    Family History  Problem Relation Age of Onset  . Cancer Mother        lung/ smoker  . Heart attack Paternal Grandmother   . Heart disease Father   . Hypertension Father   . Kidney disease Father 29       dialysis  . Obesity Father   .  Diabetes type II Father   . Graves' disease Daughter   . Hypothyroidism Daughter        h/o Hashimoto's now hypothyroid  . Cholecystitis Son 49       cholecystectomy    Social History   Social History  . Marital status: Widowed    Spouse name: N/A  . Number of children: N/A  . Years of education: N/A   Occupational History  . Not on file.   Social History Main Topics  . Smoking status: Former Smoker    Packs/day: 0.20    Years: 10.00    Types: Cigarettes    Quit date: 04/07/1975  . Smokeless tobacco: Never Used  . Alcohol use Yes     Comment: 1 glass of wine or coctail with dinner  . Drug use: No  . Sexual activity: No     Comment: lives with dog, no major dietary restrictions, wears seat  belt   Other Topics Concern  . Not on file   Social History Narrative  . No narrative on file    Outpatient Medications Prior to Visit  Medication Sig Dispense Refill  . albuterol (PROAIR HFA) 108 (90 Base) MCG/ACT inhaler INHALE 2 PUFFS INTO THE LUNGS EVERY 6 HOURS AS NEEDED FOR WHEEZING OR SHORTNESS OF  BREATH 8.5 Inhaler 3  . amLODipine (NORVASC) 5 MG tablet Take 1 tablet (5 mg total) by mouth daily. 30 tablet 3  . Calcium Citrate-Vitamin D (CALCIUM CITRATE + PO) Take 1 tablet by mouth daily.    . dorzolamide-timolol (COSOPT) 22.3-6.8 MG/ML ophthalmic solution Place 1 drop into both eyes 2 (two) times daily.   5  . Flaxseed, Linseed, (FLAXSEED OIL) 1000 MG CAPS Take 1 capsule by mouth daily.    . fluticasone (FLONASE) 50 MCG/ACT nasal spray SPRAY TWICE INTO BOTH NOSTRILS EVERY DAY 16 g 1  . latanoprost (XALATAN) 0.005 % ophthalmic solution Place 1 drop into both eyes at bedtime.   5  . loratadine (CLARITIN) 10 MG tablet Take 10 mg by mouth daily.    . milk thistle 175 MG tablet Take 250 mg by mouth daily.    . Multiple Vitamins-Minerals (CENTRUM ADULTS PO) Take 1 tablet by mouth daily.    . Probiotic Product (ALIGN PO) Take 1 capsule by mouth daily.    . timolol (TIMOPTIC) 0.5 %  ophthalmic solution 1 drop 2 (two) times daily.    . vitamin C (ASCORBIC ACID) 500 MG tablet Take 500 mg by mouth daily.    . benzonatate (TESSALON) 100 MG capsule Take 1 capsule (100 mg total) by mouth 3 (three) times daily as needed for cough. 21 capsule 0  . doxycycline (VIBRA-TABS) 100 MG tablet Take 1 tablet (100 mg total) by mouth 2 (two) times daily. Can give caps or generic 20 tablet 0  . potassium chloride SA (K-DUR,KLOR-CON) 20 MEQ tablet Take 20 mEq by mouth 2 (two) times daily. Take 2 tabs po x3 days, then 1 tab po prn.    . predniSONE (DELTASONE) 10 MG tablet 6 TAB PO DAY 1 5 TAB PO DAY 2 4 TAB PO DAY 3 3 TAB PO DAY 4 2 TAB PO DAY 5 1 TAB PO DAY 6 21 tablet 0   No facility-administered medications prior to visit.     Allergies  Allergen Reactions  . Brimonidine Tartrate-Timolol Other (See Comments)    Pt. reports eye redness & itchiness.  . Chlorthalidone Other (See Comments)    Significant hypokalemia  . Losartan     Shortness of breath   . Ace Inhibitors Cough    Review of Systems  Constitutional: Negative for chills, fever and malaise/fatigue.  HENT: Negative for congestion and hearing loss.   Eyes: Negative for discharge.  Respiratory: Negative for cough, sputum production and shortness of breath.   Cardiovascular: Negative for chest pain, palpitations and leg swelling.  Gastrointestinal: Negative for abdominal pain, blood in stool, constipation, diarrhea, heartburn, nausea and vomiting.  Genitourinary: Negative for dysuria, frequency, hematuria and urgency.  Musculoskeletal: Negative for back pain, falls and myalgias.  Skin: Negative for rash.  Neurological: Negative for dizziness, sensory change, loss of consciousness, weakness and headaches.  Endo/Heme/Allergies: Negative for environmental allergies. Does not bruise/bleed easily.  Psychiatric/Behavioral: Negative for depression and suicidal ideas. The patient is not nervous/anxious and does not have  insomnia.        Objective:    Physical Exam  Constitutional: She is oriented to person, place, and time. She appears well-developed and well-nourished. No distress.  HENT:  Head: Normocephalic and atraumatic.  Eyes: Conjunctivae are normal.  Neck: Neck supple. No thyromegaly present.  Cardiovascular: Normal rate, regular rhythm and normal heart sounds.   No murmur heard. Pulmonary/Chest: Effort normal and breath sounds normal. No respiratory distress.  Abdominal: Soft. Bowel sounds  are normal. She exhibits no distension and no mass. There is no tenderness.  Musculoskeletal: She exhibits no edema.  Lymphadenopathy:    She has no cervical adenopathy.  Neurological: She is alert and oriented to person, place, and time.  Skin: Skin is warm and dry.  Psychiatric: She has a normal mood and affect. Her behavior is normal.    BP 140/67   Pulse 60   Temp 98.6 F (37 C) (Oral)   Ht 5\' 5"  (1.651 m)   Wt 180 lb (81.6 kg)   SpO2 98%   BMI 29.95 kg/m  Wt Readings from Last 3 Encounters:  12/14/16 180 lb (81.6 kg)  10/30/16 180 lb 3.2 oz (81.7 kg)  07/27/16 181 lb 12.8 oz (82.5 kg)   BP Readings from Last 3 Encounters:  12/14/16 140/67  10/30/16 128/82  09/17/16 (!) 145/61     Immunization History  Administered Date(s) Administered  . Influenza Split 12/28/2011, 01/29/2016  . Influenza Whole 01/05/2011  . Influenza,inj,Quad PF,6+ Mos 12/26/2012, 12/14/2013  . Influenza-Unspecified 02/06/2015  . Pneumococcal Conjugate-13 06/19/2013  . Pneumococcal Polysaccharide-23 04/06/2005, 06/27/2015  . Tdap 04/21/2011  . Zoster 11/05/2011    Health Maintenance  Topic Date Due  . INFLUENZA VACCINE  11/04/2016  . DEXA SCAN  06/06/2018 (Originally 10/18/2005)  . TETANUS/TDAP  04/20/2021  . PNA vac Low Risk Adult  Completed    Lab Results  Component Value Date   WBC 8.2 07/24/2016   HGB 14.3 07/24/2016   HCT 42.3 07/24/2016   PLT 214.0 07/24/2016   GLUCOSE 96 09/17/2016   CHOL  200 07/24/2016   TRIG 107.0 07/24/2016   HDL 54.70 07/24/2016   LDLDIRECT 131.6 12/12/2012   LDLCALC 124 (H) 07/24/2016   ALT 21 09/17/2016   AST 18 09/17/2016   NA 141 09/17/2016   K 3.8 09/17/2016   CL 105 09/17/2016   CREATININE 0.85 09/17/2016   BUN 14 09/17/2016   CO2 30 09/17/2016   TSH 2.79 07/24/2016    Lab Results  Component Value Date   TSH 2.79 07/24/2016   Lab Results  Component Value Date   WBC 8.2 07/24/2016   HGB 14.3 07/24/2016   HCT 42.3 07/24/2016   MCV 90.6 07/24/2016   PLT 214.0 07/24/2016   Lab Results  Component Value Date   NA 141 09/17/2016   K 3.8 09/17/2016   CO2 30 09/17/2016   GLUCOSE 96 09/17/2016   BUN 14 09/17/2016   CREATININE 0.85 09/17/2016   BILITOT 0.7 09/17/2016   ALKPHOS 80 09/17/2016   AST 18 09/17/2016   ALT 21 09/17/2016   PROT 6.6 09/17/2016   ALBUMIN 4.1 09/17/2016   CALCIUM 9.3 09/17/2016   GFR 69.13 09/17/2016   Lab Results  Component Value Date   CHOL 200 07/24/2016   Lab Results  Component Value Date   HDL 54.70 07/24/2016   Lab Results  Component Value Date   LDLCALC 124 (H) 07/24/2016   Lab Results  Component Value Date   TRIG 107.0 07/24/2016   Lab Results  Component Value Date   CHOLHDL 4 07/24/2016   No results found for: HGBA1C       Assessment & Plan:   Problem List Items Addressed This Visit    Osteoporosis    Encouraged to get adequate exercise, calcium and vitamin d intake      HTN (hypertension)    Well controlled, no changes to meds. Encouraged heart healthy diet such as the DASH diet and  exercise as tolerated.       Hyperlipidemia    Encouraged heart healthy diet, increase exercise, avoid trans fats, consider a krill oil cap daily      Preventative health care    Patient encouraged to maintain heart healthy diet, regular exercise, adequate sleep. Consider daily probiotics. Take medications as prescribed       Other Visit Diagnoses    Need for immunization against  influenza    -  Primary   Relevant Orders   Flu vaccine HIGH DOSE PF (Fluzone High dose)      I have discontinued Ms. Pullen's potassium chloride SA, predniSONE, benzonatate, and doxycycline. I am also having her maintain her dorzolamide-timolol, latanoprost, vitamin C, Calcium Citrate-Vitamin D (CALCIUM CITRATE + PO), Flaxseed Oil, milk thistle, Multiple Vitamins-Minerals (CENTRUM ADULTS PO), Probiotic Product (ALIGN PO), timolol, loratadine, albuterol, fluticasone, and amLODipine.  No orders of the defined types were placed in this encounter.   CMA served as Education administrator during this visit. History, Physical and Plan performed by medical provider. Documentation and orders reviewed and attested to.  Penni Homans, MD    An error was made in billing this visit. It was not a CPE but a follow up on her chronic medical concerns instead. We will resubmit the billing.

## 2016-12-14 NOTE — Patient Instructions (Signed)

## 2016-12-19 NOTE — Assessment & Plan Note (Signed)
Patient encouraged to maintain heart healthy diet, regular exercise, adequate sleep. Consider daily probiotics. Take medications as prescribed 

## 2016-12-19 NOTE — Assessment & Plan Note (Signed)
Encouraged to get adequate exercise, calcium and vitamin d intake 

## 2016-12-19 NOTE — Assessment & Plan Note (Signed)
Encouraged heart healthy diet, increase exercise, avoid trans fats, consider a krill oil cap daily 

## 2016-12-19 NOTE — Assessment & Plan Note (Signed)
Well controlled, no changes to meds. Encouraged heart healthy diet such as the DASH diet and exercise as tolerated.  °

## 2016-12-21 DIAGNOSIS — C6932 Malignant neoplasm of left choroid: Secondary | ICD-10-CM | POA: Diagnosis not present

## 2016-12-21 DIAGNOSIS — Z23 Encounter for immunization: Secondary | ICD-10-CM | POA: Diagnosis not present

## 2017-01-07 ENCOUNTER — Encounter: Payer: Self-pay | Admitting: Family Medicine

## 2017-01-15 ENCOUNTER — Other Ambulatory Visit: Payer: Self-pay

## 2017-01-15 MED ORDER — AMLODIPINE BESYLATE 5 MG PO TABS: 5.0000 mg | ORAL_TABLET | Freq: Every day | ORAL | 3 refills | Status: DC

## 2017-01-21 DIAGNOSIS — L820 Inflamed seborrheic keratosis: Secondary | ICD-10-CM | POA: Diagnosis not present

## 2017-01-21 DIAGNOSIS — D1801 Hemangioma of skin and subcutaneous tissue: Secondary | ICD-10-CM | POA: Diagnosis not present

## 2017-01-21 DIAGNOSIS — L814 Other melanin hyperpigmentation: Secondary | ICD-10-CM | POA: Diagnosis not present

## 2017-01-21 DIAGNOSIS — L821 Other seborrheic keratosis: Secondary | ICD-10-CM | POA: Diagnosis not present

## 2017-01-21 DIAGNOSIS — D225 Melanocytic nevi of trunk: Secondary | ICD-10-CM | POA: Diagnosis not present

## 2017-01-21 DIAGNOSIS — L570 Actinic keratosis: Secondary | ICD-10-CM | POA: Diagnosis not present

## 2017-01-21 DIAGNOSIS — Z85820 Personal history of malignant melanoma of skin: Secondary | ICD-10-CM | POA: Diagnosis not present

## 2017-01-28 DIAGNOSIS — Z961 Presence of intraocular lens: Secondary | ICD-10-CM | POA: Diagnosis not present

## 2017-01-28 DIAGNOSIS — H401123 Primary open-angle glaucoma, left eye, severe stage: Secondary | ICD-10-CM | POA: Diagnosis not present

## 2017-01-28 DIAGNOSIS — H401112 Primary open-angle glaucoma, right eye, moderate stage: Secondary | ICD-10-CM | POA: Diagnosis not present

## 2017-01-28 DIAGNOSIS — C6932 Malignant neoplasm of left choroid: Secondary | ICD-10-CM | POA: Diagnosis not present

## 2017-03-03 ENCOUNTER — Ambulatory Visit (INDEPENDENT_AMBULATORY_CARE_PROVIDER_SITE_OTHER): Payer: Medicare Other | Admitting: Medical

## 2017-03-03 ENCOUNTER — Encounter: Payer: Self-pay | Admitting: Medical

## 2017-03-03 VITALS — BP 120/52 | HR 64 | Temp 98.1°F | Resp 16 | Ht 65.0 in | Wt 179.2 lb

## 2017-03-03 DIAGNOSIS — R05 Cough: Secondary | ICD-10-CM

## 2017-03-03 DIAGNOSIS — J40 Bronchitis, not specified as acute or chronic: Secondary | ICD-10-CM | POA: Diagnosis not present

## 2017-03-03 DIAGNOSIS — R062 Wheezing: Secondary | ICD-10-CM

## 2017-03-03 DIAGNOSIS — R059 Cough, unspecified: Secondary | ICD-10-CM

## 2017-03-03 MED ORDER — DOXYCYCLINE HYCLATE 100 MG PO TABS: 100.0000 mg | ORAL_TABLET | Freq: Two times a day (BID) | ORAL | 0 refills | Status: DC

## 2017-03-03 MED ORDER — BENZONATATE 100 MG PO CAPS
100.0000 mg | ORAL_CAPSULE | Freq: Three times a day (TID) | ORAL | 0 refills | Status: DC | PRN
Start: 2017-03-03 — End: 2020-07-16

## 2017-03-03 MED ORDER — PREDNISONE 10 MG PO TABS: ORAL_TABLET | ORAL | 0 refills | Status: DC

## 2017-03-03 MED ORDER — ALBUTEROL SULFATE HFA 108 (90 BASE) MCG/ACT IN AERS: INHALATION_SPRAY | RESPIRATORY_TRACT | 3 refills | Status: DC

## 2017-03-03 NOTE — Patient Instructions (Signed)
You appear to have bronchitis. Rest hydrate and tylenol for fever. I am prescribing cough medicine benzonatate, and doxycycline antibiotic. For your nasal congestion you could flonase.   For recent moderate to severe wheezing continue albuterol. Continue prednisone 5 day taper dose.  You should gradually get better. If not then notify us and would recommend a chest xray.  Follow up in 7-10 days or as needed

## 2017-03-03 NOTE — Progress Notes (Signed)
Subjective:    Patient ID: Karen Hoffman, female    DOB: July 07, 1940, 76 y.o.   MRN: [id number]  HPI  Pt in for some recent chest congestion following uri type symptoms. Pt state feels lung constriction. She will use albuterol and feels like she can take full deep breath for 2 hours and feels tight again. Some wheezing intermittent.   Pt states uri and sinus symptoms last for about 1 wk. But chest congestion and wheezing lasting 2 weeks.  No fever, no chills or sweats.   Pt not using any inhaler except albuterol.    Review of Systems  Constitutional: Negative for chills, fatigue and fever.  Respiratory: Positive for wheezing. Negative for cough, chest tightness and shortness of breath.   Cardiovascular: Negative for chest pain and palpitations.  Gastrointestinal: Negative for abdominal pain, blood in stool, nausea and vomiting.  Musculoskeletal: Negative for back pain and joint swelling.  Skin: Negative for rash.  Neurological: Negative for dizziness and headaches.  Hematological: Negative for adenopathy. Does not bruise/bleed easily.  Psychiatric/Behavioral: Negative for behavioral problems, confusion and suicidal ideas. The patient is not nervous/anxious.     Past Medical History:  Diagnosis Date  . Allergy    improved since move to [place]  . Asthma    mild, intermittent, improved since moving to [place]  . Carcinoma in situ of skin of back   . Cataracts, bilateral   . Cervical cancer screening 06/19/2013  . Elevated BP   . Elevated LFTs 07/02/2010  . Glaucoma   . History of chicken pox 07/02/2010  . History of measles 07/02/2010  . HTN (hypertension) 08/06/2010  . Hyperlipidemia 09/04/2010  . Hypokalemia 11/18/2011  . 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 Medicare annual wellness visit, subsequent 04/21/2011  . Osteoporosis   . Overweight(278.02) 07/02/2010  . Thyroid disease 07/02/2010  . URI (upper respiratory infection) 11/17/2011  . Vitamin D deficiency 06/27/2015      Social History   Socioeconomic History  . Marital status: Widowed    Spouse name: Not on file  . Number of children: Not on file  . Years of education: Not on file  . Highest education level: Not on file  Social Needs  . Financial resource strain: Not on file  . Food insecurity - worry: Not on file  . Food insecurity - inability: Not on file  . Transportation needs - medical: Not on file  . Transportation needs - non-medical: Not on file  Occupational History  . Not on file  Tobacco Use  . Smoking status: Former Smoker    Packs/day: 0.20    Years: 10.00    Pack years: 2.00    Types: Cigarettes    Last attempt to quit: 04/07/1975    Years since quitting: 41.9  . Smokeless tobacco: Never Used  Substance and Sexual Activity  . Alcohol use: Yes    Comment: 1 glass of wine or coctail with dinner  . Drug use: No  . Sexual activity: No    Comment: lives with dog, no major dietary restrictions, wears seat  belt  Other Topics Concern  . Not on file  Social History Narrative  . Not on file    Past Surgical History:  Procedure Laterality Date  . broken ankle  1997   right, screws and plates in place  . left eye surgery    . TONSILLECTOMY    . TRUNK SKIN LESION EXCISIONAL BIOPSY  x twice    Family History  Problem Relation Age of Onset  . Cancer Mother        lung/ smoker  . Heart attack Paternal Grandmother   . Heart disease Father   . Hypertension Father   . Kidney disease Father 32       dialysis  . Obesity Father   . Diabetes type II Father   . Graves' disease Daughter   . Hypothyroidism Daughter        h/o Hashimoto's now hypothyroid  . Cholecystitis Son 63       cholecystectomy    Allergies  Allergen Reactions  . Brimonidine Tartrate-Timolol Other (See Comments)    Pt. reports eye redness & itchiness.  . Chlorthalidone Other (See Comments)    Significant hypokalemia  . Losartan     Shortness of breath   . Ace Inhibitors Cough    Current  Outpatient Medications on File Prior to Visit  Medication Sig Dispense Refill  . albuterol (PROAIR HFA) 108 (90 Base) MCG/ACT inhaler INHALE 2 PUFFS INTO THE LUNGS EVERY 6 HOURS AS NEEDED FOR WHEEZING OR SHORTNESS OF BREATH 8.5 Inhaler 3  . amLODipine (NORVASC) 5 MG tablet Take 1 tablet (5 mg total) by mouth daily. 30 tablet 3  . Calcium Citrate-Vitamin D (CALCIUM CITRATE + PO) Take 1 tablet by mouth daily.    . dorzolamide-timolol (COSOPT) 22.3-6.8 MG/ML ophthalmic solution Place 1 drop into both eyes 2 (two) times daily.   5  . Flaxseed, Linseed, (FLAXSEED OIL) 1000 MG CAPS Take 1 capsule by mouth daily.    . fluticasone (FLONASE) 50 MCG/ACT nasal spray SPRAY TWICE INTO BOTH NOSTRILS EVERY DAY 16 g 1  . latanoprost (XALATAN) 0.005 % ophthalmic solution Place 1 drop into both eyes at bedtime.   5  . loratadine (CLARITIN) 10 MG tablet Take 10 mg by mouth daily.    . milk thistle 175 MG tablet Take 250 mg by mouth daily.    . Multiple Vitamins-Minerals (CENTRUM ADULTS PO) Take 1 tablet by mouth daily.    . Probiotic Product (ALIGN PO) Take 1 capsule by mouth daily.    . timolol (TIMOPTIC) 0.5 % ophthalmic solution 1 drop 2 (two) times daily.    . vitamin C (ASCORBIC ACID) 500 MG tablet Take 500 mg by mouth daily.     No current facility-administered medications on file prior to visit.     BP (!) 120/52 (BP Location: Left Arm, Patient Position: Sitting, Cuff Size: Large)   Pulse 64   Temp 98.1 F (36.7 C) (Oral)   Resp 16   Ht 5\' 5"  (1.651 m)   Wt 179 lb 3.2 oz (81.3 kg)   SpO2 98%   BMI 29.82 kg/m       Objective:   Physical Exam  General  Mental Status - Alert. General Appearance - Well groomed. Not in acute distress.  Skin Rashes- No Rashes.  HEENT Head- Normal. Ear Auditory Canal - Left- Normal. Right - Normal.Tympanic Membrane- Left- Normal. Right- Normal. Eye Sclera/Conjunctiva- Left- Normal. Right- Normal. Nose & Sinuses Nasal Mucosa- Left-  Not boggy or Congested.  Right-  Not  boggy or Congested. No sinus pressure. Mouth & Throat Lips: Upper Lip- Normal: no dryness, cracking, pallor, cyanosis, or vesicular eruption. Lower Lip-Normal: no dryness, cracking, pallor, cyanosis or vesicular eruption. Buccal Mucosa- Bilateral- No Aphthous ulcers. Oropharynx- No Discharge or Erythema. Tonsils: Characteristics- Bilateral- No Erythema or Congestion. Size/Enlargement- Bilateral- No enlargement. Discharge- bilateral-None.  Neck  Neck- Supple. No Masses.   Chest and Lung Exam Auscultation: Breath Sounds:- even and unlabored, but bilateral upper lobe rhonchi with scattered wheezing.  Cardiovascular Auscultation:Rythm- Regular, rate and rhythm. Murmurs & Other Heart Sounds:Ausculatation of the heart reveal- No Murmurs.  Lymphatic Head & Neck General Head & Neck Lymphatics: Bilateral: Description- No Localized lymphadenopathy.       Assessment & Plan:  You appear to have bronchitis. Rest hydrate and tylenol for fever. I am prescribing cough medicine benzonatate, and doxycycline antibiotic. For your nasal congestion you could flonase.   For recent moderate to severe wheezing continue albuterol. Continue prednisone 5 day taper dose.  You should gradually get better. If not then notify us and would recommend a chest xray.  Follow up in 7-10 days or as needed  Kymberley Raz, [facility]ercell Miller, [employer]

## 2017-04-01 ENCOUNTER — Other Ambulatory Visit: Payer: Self-pay

## 2017-04-01 MED ORDER — FLUTICASONE PROPIONATE 50 MCG/ACT NA SUSP: 2.0000 | Freq: Every day | NASAL | 3 refills | Status: DC

## 2017-05-11 ENCOUNTER — Ambulatory Visit: Payer: Self-pay

## 2017-05-11 NOTE — Telephone Encounter (Signed)
Patient called in with "SOB." She says "I am having shortness of breath and the inhaler is not working. Can the medicines I was put on in December when I saw Percell Miller be reordered, because that cured me." I advised she would need to be seen by a provider before anything can be prescribed. She says It all started Saturday after I visited the nursing home. I've been taking all my medicines and the inhaler is not helping me." I asked how SOB is she right now, she says "I have to take in a deep breath in order to get out my sentences, I have some wheezing, I am more comfortable leaning forward and it is hard for me to sleep soundly at night." I asked has she had this type of flare up before, she said "yes, last month I think and Percell Miller put me on some medicine and it cured me. I need it again." She reports having a productive cough and runny nose, denies fever, chest pain, dizziness. No audible wheezing heard while talking to patient, but when patient took a deep breath, I could hear some wheezing. I advised patient to go to UC per protocol, she said "I want to be seen in the office because my insurance is funny about paying for the emergency room and I don't know about urgent care." There were no available appointments in her current practice, so I offered to send her to another [place] practice, she said "I want to stay at my office where they know me. I can be seen tomorrow. Can you put me on the waiting list for today, because I can be there in 15 minutes." I advised that she has an appointment tomorrow at 8:15 am with Mackie Pai, [facility], care advice given, she verbalized understanding. I advised patient to go to the emergency room if her breathing became worse, she said "I will stay in the house today and not do anything until I have to go to the office in the morning, so I will be ok."  Reason for Disposition . [1] MILD difficulty breathing (e.g., minimal/no SOB at rest, SOB with walking, pulse <100) AND [2]  NEW-onset or WORSE than normal  Answer Assessment - Initial Assessment Questions 1. RESPIRATORY STATUS: "Describe your breathing?" (e.g., wheezing, shortness of breath, unable to speak, severe coughing)      Shortness of breath, unable to get a full breath in, productive coughing 2. ONSET: "When did this breathing problem begin?"      Saturday 3. PATTERN "Does the difficult breathing come and go, or has it been constant since it started?"      Constant-taking mucinex, loratadine, flonase, albuterol inhaler 4. SEVERITY: "How bad is your breathing?" (e.g., mild, moderate, severe)    - MILD: No SOB at rest, mild SOB with walking, speaks normally in sentences, can lay down, no retractions, pulse < 100.    - MODERATE: SOB at rest, SOB with minimal exertion and prefers to sit, cannot lie down flat, speaks in phrases, mild retractions, audible wheezing, pulse 100-120.    - SEVERE: Very SOB at rest, speaks in single words, struggling to breathe, sitting hunched forward, retractions, pulse > 120      Moderate-severe; more comfortable leaning forward 5. RECURRENT SYMPTOM: "Have you had difficulty breathing before?" If so, ask: "When was the last time?" and "What happened that time?"      Yes-last time was started on medication and it helped me 6. CARDIAC HISTORY: "Do you have any history  of heart disease?" (e.g., heart attack, angina, bypass surgery, angioplasty)      No 7. LUNG HISTORY: "Do you have any history of lung disease?"  (e.g., pulmonary embolus, asthma, emphysema)     No 8. CAUSE: "What do you think is causing the breathing problem?"      Maybe germs from nursing home when I visited on Saturday 9. OTHER SYMPTOMS: "Do you have any other symptoms? (e.g., dizziness, runny nose, cough, chest pain, fever)     Cough-productive clear; runny nose 10. PREGNANCY: "Is there any chance you are pregnant?" "When was your last menstrual period?"       No 11. TRAVEL: "Have you traveled out of the country  in the last month?" (e.g., travel history, exposures)       No  Protocols used: BREATHING DIFFICULTY-A-AH

## 2017-05-12 ENCOUNTER — Ambulatory Visit (HOSPITAL_BASED_OUTPATIENT_CLINIC_OR_DEPARTMENT_OTHER)
Admission: RE | Admit: 2017-05-12 | Discharge: 2017-05-12 | Disposition: A | Discharge status: Home / Self Care | Payer: Medicare Other | Source: Ambulatory Visit | Attending: Medical | Admitting: Medical

## 2017-05-12 ENCOUNTER — Encounter: Payer: Self-pay | Admitting: Medical

## 2017-05-12 ENCOUNTER — Other Ambulatory Visit: Payer: Self-pay | Admitting: Family Medicine

## 2017-05-12 ENCOUNTER — Ambulatory Visit (INDEPENDENT_AMBULATORY_CARE_PROVIDER_SITE_OTHER): Payer: Medicare Other | Admitting: Medical

## 2017-05-12 VITALS — BP 121/52 | HR 88 | Temp 98.2°F | Resp 16 | Ht 65.0 in | Wt 183.2 lb

## 2017-05-12 DIAGNOSIS — J3489 Other specified disorders of nose and nasal sinuses: Secondary | ICD-10-CM | POA: Diagnosis not present

## 2017-05-12 DIAGNOSIS — R062 Wheezing: Secondary | ICD-10-CM

## 2017-05-12 DIAGNOSIS — R05 Cough: Secondary | ICD-10-CM

## 2017-05-12 DIAGNOSIS — I700 Atherosclerosis of aorta: Secondary | ICD-10-CM | POA: Insufficient documentation

## 2017-05-12 DIAGNOSIS — M791 Myalgia, unspecified site: Secondary | ICD-10-CM

## 2017-05-12 DIAGNOSIS — R059 Cough, unspecified: Secondary | ICD-10-CM

## 2017-05-12 MED ORDER — OSELTAMIVIR PHOSPHATE 75 MG PO CAPS: 75.0000 mg | ORAL_CAPSULE | Freq: Two times a day (BID) | ORAL | 0 refills | Status: DC

## 2017-05-12 MED ORDER — DOXYCYCLINE HYCLATE 100 MG PO TABS: 100.0000 mg | ORAL_TABLET | Freq: Two times a day (BID) | ORAL | 0 refills | Status: DC

## 2017-05-12 MED ORDER — HYDROCODONE-HOMATROPINE 5-1.5 MG/5ML PO SYRP: 5.0000 mL | ORAL_SOLUTION | Freq: Three times a day (TID) | ORAL | 0 refills | Status: DC | PRN

## 2017-05-12 MED ORDER — PREDNISONE 10 MG PO TABS: ORAL_TABLET | ORAL | 0 refills | Status: DC

## 2017-05-12 MED ORDER — ALBUTEROL SULFATE HFA 108 (90 BASE) MCG/ACT IN AERS: 2.0000 | INHALATION_SPRAY | Freq: Four times a day (QID) | RESPIRATORY_TRACT | 5 refills | Status: DC | PRN

## 2017-05-12 NOTE — Telephone Encounter (Signed)
Copied from [place] [id number]. Topic: Quick Communication - Rx Refill/Question >> May 12, 2017  9:50 AM Lolita Rieger, RMA wrote: Medication: albuterol inhaer   Has the patient contacted their pharmacy? yes   (Agent: If no, request that the patient contact the pharmacy for the refill.)   Preferred Pharmacy (with phone number or street name): [facility] [address]   Agent: Please be advised that RX refills may take up to 3 business days. We ask that you follow-up with your pharmacy.

## 2017-05-12 NOTE — Patient Instructions (Addendum)
You do appear to have some recent flulike symptoms.  Your rapid flu test was negative but I do think your symptoms are suspicious enough to consider a false negative rapid result.  In addition I think you would still be in the treatment timeframe in light of the fact that your body aches started yesterday.  So I will prescribe Tamiflu.  Please start that today.  For your wheezing, I am refilling your albuterol inhaler.  Since you report last night you had to use albuterol every 2 hours I do think it would be best to go ahead and give you taper dose of prednisone to start.  Concern for sinus infection and possible bronchitis secondary to the flu.  Will get chest x-ray today to evaluate if you have any pneumonia present.  We will go ahead and prescribe doxycycline antibiotic.  For severe cough prescription of Hycodan.  Follow-up in 7-10 days or as needed.

## 2017-05-12 NOTE — Progress Notes (Signed)
Subjective:    Patient ID: Karen Hoffman, female    DOB: Aug 05, 1940, 77 y.o.   MRN: [id number]  HPI  Pt in states Saturday went to nursing home. A lot of persons their had cough. Then shortly thereafter she started to wheeze. She states feels achy, fatigue and headache.  Pt states having to take use albuterol every 2 hours since last night. Some sinus pressure. Some productive cough.   Review of Systems  Constitutional: Positive for chills, fatigue and fever.  HENT: Positive for congestion, sinus pressure and sinus pain. Negative for sore throat.        Pt on flonase for nasal congestion.  Respiratory: Positive for cough. Negative for chest tightness, shortness of breath and wheezing.   Cardiovascular: Negative for chest pain and palpitations.  Gastrointestinal: Negative for abdominal pain.  Musculoskeletal: Positive for myalgias. Negative for back pain, neck pain and neck stiffness.  Skin: Negative for wound.  Neurological: Negative for dizziness, syncope, weakness and headaches.  Hematological: Negative for adenopathy. Does not bruise/bleed easily.  Psychiatric/Behavioral: Negative for behavioral problems and confusion. The patient is not nervous/anxious.     Past Medical History:  Diagnosis Date  . Allergy    improved since move to [place]  . Asthma    mild, intermittent, improved since moving to [place]  . Carcinoma in situ of skin of back   . Cataracts, bilateral   . Cervical cancer screening 06/19/2013  . Elevated BP   . Elevated LFTs 07/02/2010  . Glaucoma   . History of chicken pox 07/02/2010  . History of measles 07/02/2010  . HTN (hypertension) 08/06/2010  . Hyperlipidemia 09/04/2010  . Hypokalemia 11/18/2011  . 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 Medicare annual wellness visit, subsequent 04/21/2011  . Osteoporosis   . Overweight(278.02) 07/02/2010  . Thyroid disease 07/02/2010  . URI (upper respiratory infection) 11/17/2011  . Vitamin D deficiency 06/27/2015     Social History   Socioeconomic History  . Marital status: Widowed    Spouse name: Not on file  . Number of children: Not on file  . Years of education: Not on file  . Highest education level: Not on file  Social Needs  . Financial resource strain: Not on file  . Food insecurity - worry: Not on file  . Food insecurity - inability: Not on file  . Transportation needs - medical: Not on file  . Transportation needs - non-medical: Not on file  Occupational History  . Not on file  Tobacco Use  . Smoking status: Former Smoker    Packs/day: 0.20    Years: 10.00    Pack years: 2.00    Types: Cigarettes    Last attempt to quit: 04/07/1975    Years since quitting: 42.1  . Smokeless tobacco: Never Used  Substance and Sexual Activity  . Alcohol use: Yes    Comment: 1 glass of wine or coctail with dinner  . Drug use: No  . Sexual activity: No    Comment: lives with dog, no major dietary restrictions, wears seat  belt  Other Topics Concern  . Not on file  Social History Narrative  . Not on file    Past Surgical History:  Procedure Laterality Date  . broken ankle  1997   right, screws and plates in place  . left eye surgery    . TONSILLECTOMY    . TRUNK SKIN LESION EXCISIONAL BIOPSY     x  twice    Family History  Problem Relation Age of Onset  . Cancer Mother        lung/ smoker  . Heart attack Paternal Grandmother   . Heart disease Father   . Hypertension Father   . Kidney disease Father 51       dialysis  . Obesity Father   . Diabetes type II Father   . Graves' disease Daughter   . Hypothyroidism Daughter        h/o Hashimoto's now hypothyroid  . Cholecystitis Son 48       cholecystectomy    Allergies  Allergen Reactions  . Brimonidine Tartrate-Timolol Other (See Comments)    Pt. reports eye redness & itchiness.  . Chlorthalidone Other (See Comments)    Significant hypokalemia  . Losartan     Shortness of breath   . Ace Inhibitors Cough    Current  Outpatient Medications on File Prior to Visit  Medication Sig Dispense Refill  . albuterol (PROAIR HFA) 108 (90 Base) MCG/ACT inhaler INHALE 2 PUFFS INTO THE LUNGS EVERY 6 HOURS AS NEEDED FOR WHEEZING OR SHORTNESS OF BREATH 8.5 Inhaler 3  . amLODipine (NORVASC) 5 MG tablet Take 1 tablet (5 mg total) by mouth daily. 30 tablet 3  . benzonatate (TESSALON) 100 MG capsule Take 1 capsule (100 mg total) by mouth 3 (three) times daily as needed for cough. 21 capsule 0  . Calcium Citrate-Vitamin D (CALCIUM CITRATE + PO) Take 1 tablet by mouth daily.    . dorzolamide-timolol (COSOPT) 22.3-6.8 MG/ML ophthalmic solution Place 1 drop into both eyes 2 (two) times daily.   5  . doxycycline (VIBRA-TABS) 100 MG tablet Take 1 tablet (100 mg total) by mouth 2 (two) times daily. Can give caps or generic 20 tablet 0  . Flaxseed, Linseed, (FLAXSEED OIL) 1000 MG CAPS Take 1 capsule by mouth daily.    . fluticasone (FLONASE) 50 MCG/ACT nasal spray Place 2 sprays into both nostrils daily. 48 g 3  . latanoprost (XALATAN) 0.005 % ophthalmic solution Place 1 drop into both eyes at bedtime.   5  . loratadine (CLARITIN) 10 MG tablet Take 10 mg by mouth daily.    . milk thistle 175 MG tablet Take 250 mg by mouth daily.    . Multiple Vitamins-Minerals (CENTRUM ADULTS PO) Take 1 tablet by mouth daily.    . predniSONE (DELTASONE) 10 MG tablet 5 TAB PO DAY 1 4 TAB PO DAY 2 3 TAB PO DAY 3 2 TAB PO DAY 4 1 TAB PO DAY 5 15 tablet 0  . Probiotic Product (ALIGN PO) Take 1 capsule by mouth daily.    . timolol (TIMOPTIC) 0.5 % ophthalmic solution 1 drop 2 (two) times daily.    . vitamin C (ASCORBIC ACID) 500 MG tablet Take 500 mg by mouth daily.     No current facility-administered medications on file prior to visit.     There were no vitals taken for this visit.      Objective:   Physical Exam  General  Mental Status - Alert. General Appearance - Well groomed. Not in acute distress.  Skin Rashes- No  Rashes.  HEENT Head- Normal. Ear Auditory Canal - Left- Normal. Right - Normal.Tympanic Membrane- Left- Normal. Right- Normal. Eye Sclera/Conjunctiva- Left- Normal. Right- Normal. Nose & Sinuses Nasal Mucosa- Left-  Boggy and Congested. Right-  Boggy and  Congested.Bilateral maxillary and frontal sinus pressure. Mouth & Throat Lips: Upper Lip- Normal: no dryness, cracking, pallor,  cyanosis, or vesicular eruption. Lower Lip-Normal: no dryness, cracking, pallor, cyanosis or vesicular eruption. Buccal Mucosa- Bilateral- No Aphthous ulcers. Oropharynx- No Discharge or Erythema. Tonsils: Characteristics- Bilateral- No Erythema or Congestion. Size/Enlargement- Bilateral- No enlargement. Discharge- bilateral-None.  Neck Neck- Supple. No Masses.   Chest and Lung Exam Auscultation: Breath Sounds:- even and unlabored.  Use coarse breath sounds bilaterally.  Occasional expiratory wheeze  Cardiovascular Auscultation:Rythm- Regular, rate and rhythm. Murmurs & Other Heart Sounds:Ausculatation of the heart reveal- No Murmurs.  Lymphatic Head & Neck General Head & Neck Lymphatics: Bilateral: Description- No Localized lymphadenopathy.       Assessment & Plan:  You do appear to have some recent flulike symptoms.  Your rapid flu test was negative but I do think your symptoms are suspicious enough to consider a false negative rapid result.  In addition I think you would still be in the treatment timeframe in light of the fact that your body aches started yesterday.  So I will prescribe Tamiflu.  Please start that today.  For your wheezing, I am refilling your albuterol inhaler.  Since you report last night you had to use albuterol every 2 hours I do think it would be best to go ahead and give you taper dose of prednisone to start.  Concern for sinus infection and possible bronchitis secondary to the flu.  Will get chest x-ray today to evaluate if you have any pneumonia present.  We will go ahead  and prescribe doxycycline antibiotic.  For severe cough prescription of Hycodan.  Follow-up in 7-10 days or as needed.  Mackie Pai, PA-C

## 2017-05-12 NOTE — Telephone Encounter (Signed)
Refills sent

## 2017-06-01 DIAGNOSIS — H401112 Primary open-angle glaucoma, right eye, moderate stage: Secondary | ICD-10-CM | POA: Diagnosis not present

## 2017-06-01 DIAGNOSIS — H401123 Primary open-angle glaucoma, left eye, severe stage: Secondary | ICD-10-CM | POA: Diagnosis not present

## 2017-06-01 DIAGNOSIS — H04123 Dry eye syndrome of bilateral lacrimal glands: Secondary | ICD-10-CM | POA: Diagnosis not present

## 2017-06-01 DIAGNOSIS — H26492 Other secondary cataract, left eye: Secondary | ICD-10-CM | POA: Diagnosis not present

## 2017-07-05 ENCOUNTER — Telehealth: Payer: Self-pay

## 2017-07-05 NOTE — Telephone Encounter (Signed)
Copied from [place]. Topic: Inquiry >> Jul 05, 2017 10:27 AM Margot Ables wrote: Reason for CRM: pt is requesting pre visit labs - pt was scheduled for cpe 08/02/17 - pt has traditional medicare - does this pt need to be changed to AWV with nurse and f/u with Dr. Charlett Blake?

## 2017-07-22 DIAGNOSIS — L820 Inflamed seborrheic keratosis: Secondary | ICD-10-CM | POA: Diagnosis not present

## 2017-07-22 DIAGNOSIS — L814 Other melanin hyperpigmentation: Secondary | ICD-10-CM | POA: Diagnosis not present

## 2017-07-22 DIAGNOSIS — Z85820 Personal history of malignant melanoma of skin: Secondary | ICD-10-CM | POA: Diagnosis not present

## 2017-07-22 DIAGNOSIS — D225 Melanocytic nevi of trunk: Secondary | ICD-10-CM | POA: Diagnosis not present

## 2017-07-22 DIAGNOSIS — D1801 Hemangioma of skin and subcutaneous tissue: Secondary | ICD-10-CM | POA: Diagnosis not present

## 2017-07-30 ENCOUNTER — Encounter: Payer: Medicare Other | Admitting: Family Medicine

## 2017-08-02 ENCOUNTER — Encounter: Payer: Self-pay | Admitting: Family Medicine

## 2017-08-02 ENCOUNTER — Ambulatory Visit (INDEPENDENT_AMBULATORY_CARE_PROVIDER_SITE_OTHER): Payer: Medicare Other | Admitting: Family Medicine

## 2017-08-02 VITALS — BP 142/70 | HR 64 | Temp 97.5°F | Resp 18 | Wt 178.8 lb

## 2017-08-02 DIAGNOSIS — E782 Mixed hyperlipidemia: Secondary | ICD-10-CM

## 2017-08-02 DIAGNOSIS — Z0000 Encounter for general adult medical examination without abnormal findings: Secondary | ICD-10-CM

## 2017-08-02 DIAGNOSIS — R05 Cough: Secondary | ICD-10-CM

## 2017-08-02 DIAGNOSIS — R7989 Other specified abnormal findings of blood chemistry: Secondary | ICD-10-CM

## 2017-08-02 DIAGNOSIS — J45909 Unspecified asthma, uncomplicated: Secondary | ICD-10-CM | POA: Diagnosis not present

## 2017-08-02 DIAGNOSIS — I10 Essential (primary) hypertension: Secondary | ICD-10-CM

## 2017-08-02 DIAGNOSIS — E559 Vitamin D deficiency, unspecified: Secondary | ICD-10-CM | POA: Diagnosis not present

## 2017-08-02 DIAGNOSIS — M810 Age-related osteoporosis without current pathological fracture: Secondary | ICD-10-CM

## 2017-08-02 DIAGNOSIS — T7840XD Allergy, unspecified, subsequent encounter: Secondary | ICD-10-CM | POA: Diagnosis not present

## 2017-08-02 DIAGNOSIS — Z0001 Encounter for general adult medical examination with abnormal findings: Secondary | ICD-10-CM

## 2017-08-02 DIAGNOSIS — R059 Cough, unspecified: Secondary | ICD-10-CM

## 2017-08-02 DIAGNOSIS — R945 Abnormal results of liver function studies: Secondary | ICD-10-CM

## 2017-08-02 LAB — COMPREHENSIVE METABOLIC PANEL
ALBUMIN: 4.2 g/dL (ref 3.5–5.2)
ALT: 30 U/L (ref 0–35)
AST: 19 U/L (ref 0–37)
Alkaline Phosphatase: 83 U/L (ref 39–117)
BUN: 20 mg/dL (ref 6–23)
CHLORIDE: 106 meq/L (ref 96–112)
CO2: 30 mEq/L (ref 19–32)
Calcium: 9.4 mg/dL (ref 8.4–10.5)
Creatinine, Ser: 0.74 mg/dL (ref 0.40–1.20)
GFR: 80.93 mL/min (ref >60.00)
Glucose, Bld: 94 mg/dL (ref 70–99)
POTASSIUM: 4.2 meq/L (ref 3.5–5.1)
SODIUM: 142 meq/L (ref 135–145)
Total Bilirubin: 0.8 mg/dL (ref 0.2–1.2)
Total Protein: 6.9 g/dL (ref 6.0–8.3)

## 2017-08-02 LAB — LIPID PANEL
CHOLESTEROL: 197 mg/dL (ref 0–200)
HDL: 53.2 mg/dL (ref >39.00)
LDL CALC: 124 mg/dL — AB (ref 0–99)
NonHDL: 143.58
Total CHOL/HDL Ratio: 4
Triglycerides: 97 mg/dL (ref 0.0–149.0)
VLDL: 19.4 mg/dL (ref 0.0–40.0)

## 2017-08-02 LAB — CBC
HEMATOCRIT: 46.2 % — AB (ref 36.0–46.0)
Hemoglobin: 15.6 g/dL — ABNORMAL HIGH (ref 12.0–15.0)
MCHC: 33.7 g/dL (ref 30.0–36.0)
MCV: 90.9 fl (ref 78.0–100.0)
Platelets: 198 10*3/uL (ref 150.0–400.0)
RBC: 5.08 Mil/uL (ref 3.87–5.11)
RDW: 14 % (ref 11.5–15.5)
WBC: 7.3 10*3/uL (ref 4.0–10.5)

## 2017-08-02 LAB — VITAMIN D 25 HYDROXY (VIT D DEFICIENCY, FRACTURES): VITD: 46.86 ng/mL (ref 30.00–100.00)

## 2017-08-02 LAB — TSH: TSH: 1.79 u[IU]/mL (ref 0.35–4.50)

## 2017-08-02 MED ORDER — MONTELUKAST SODIUM 10 MG PO TABS: 10.0000 mg | ORAL_TABLET | Freq: Every day | ORAL | 3 refills | Status: DC

## 2017-08-02 MED ORDER — PREDNISONE 10 MG PO TABS: ORAL_TABLET | ORAL | 0 refills | Status: DC

## 2017-08-02 MED ORDER — DOXYCYCLINE HYCLATE 100 MG PO TABS: 100.0000 mg | ORAL_TABLET | Freq: Two times a day (BID) | ORAL | 0 refills | Status: DC

## 2017-08-02 MED ORDER — FLUTICASONE PROPIONATE HFA 110 MCG/ACT IN AERO: 2.0000 | INHALATION_SPRAY | Freq: Two times a day (BID) | RESPIRATORY_TRACT | 12 refills | Status: DC

## 2017-08-02 NOTE — Assessment & Plan Note (Signed)
Loratadine, Flonase and add Singulair

## 2017-08-02 NOTE — Assessment & Plan Note (Addendum)
Check level today, takes a daily supplement of Vitamin D2 unclear dose.

## 2017-08-02 NOTE — Assessment & Plan Note (Signed)
Well controlled, no changes to meds. Encouraged heart healthy diet such as the DASH diet and exercise as tolerated.  °

## 2017-08-02 NOTE — Assessment & Plan Note (Signed)
Encouraged heart healthy diet, increase exercise, avoid trans fats, consider a krill oil cap daily 

## 2017-08-02 NOTE — Patient Instructions (Addendum)
Aged or Black  Garlic tabs  Advanced Directive paperwork to office  Flovent start with 1 puff twice daily if inadequate response increase to 2 puffs twice daily as needed   Recommend calcium intake of 1200 to 1500 mg daily, divided into roughly 3 doses. Best source is the diet and a single dairy serving is about 500 mg, a supplement of calcium citrate once or twice daily to balance diet is fine if not getting enough in diet. Also need Vitamin D 2000 IU caps, 1 cap daily if not already taking vitamin D. Also recommend weight baring exercise on hips and upper body to keep bones strong   Preventive Care 77 Years and Older, Female Preventive care refers to lifestyle choices and visits with your health care provider that can promote health and wellness. What does preventive care include?  A yearly physical exam. This is also called an annual well check.  Dental exams once or twice a year.  Routine eye exams. Ask your health care provider how often you should have your eyes checked.  Personal lifestyle choices, including: ? Daily care of your teeth and gums. ? Regular physical activity. ? Eating a healthy diet. ? Avoiding tobacco and drug use. ? Limiting alcohol use. ? Practicing safe sex. ? Taking low-dose aspirin every day. ? Taking vitamin and mineral supplements as recommended by your health care provider. What happens during an annual well check? The services and screenings done by your health care provider during your annual well check will depend on your age, overall health, lifestyle risk factors, and family history of disease. Counseling Your health care provider may ask you questions about your:  Alcohol use.  Tobacco use.  Drug use.  Emotional well-being.  Home and relationship well-being.  Sexual activity.  Eating habits.  History of falls.  Memory and ability to understand (cognition).  Work and work Statistician.  Reproductive health.  Screening You may  have the following tests or measurements:  Height, weight, and BMI.  Blood pressure.  Lipid and cholesterol levels. These may be checked every 5 years, or more frequently if you are over 51 years old.  Skin check.  Lung cancer screening. You may have this screening every year starting at age 77 if you have a 30-pack-year history of smoking and currently smoke or have quit within the past 15 years.  Fecal occult blood test (FOBT) of the stool. You may have this test every year starting at age 1.  Flexible sigmoidoscopy or colonoscopy. You may have a sigmoidoscopy every 5 years or a colonoscopy every 10 years starting at age 1.  Hepatitis C blood test.  Hepatitis B blood test.  Sexually transmitted disease (STD) testing.  Diabetes screening. This is done by checking your blood sugar (glucose) after you have not eaten for a while (fasting). You may have this done every 1-3 years.  Bone density scan. This is done to screen for osteoporosis. You may have this done starting at age 77.  Mammogram. This may be done every 1-2 years. Talk to your health care provider about how often you should have regular mammograms.  Talk with your health care provider about your test results, treatment options, and if necessary, the need for more tests. Vaccines Your health care provider may recommend certain vaccines, such as:  Influenza vaccine. This is recommended every year.  Tetanus, diphtheria, and acellular pertussis (Tdap, Td) vaccine. You may need a Td booster every 10 years.  Varicella vaccine. You may need  this if you have not been vaccinated.  Zoster vaccine. You may need this after age 77.  Measles, mumps, and rubella (MMR) vaccine. You may need at least one dose of MMR if you were born in 1957 or later. You may also need a second dose.  Pneumococcal 13-valent conjugate (PCV13) vaccine. One dose is recommended after age 19.  Pneumococcal polysaccharide (PPSV23) vaccine. One dose is  recommended after age 70.  Meningococcal vaccine. You may need this if you have certain conditions.  Hepatitis A vaccine. You may need this if you have certain conditions or if you travel or work in places where you may be exposed to hepatitis A.  Hepatitis B vaccine. You may need this if you have certain conditions or if you travel or work in places where you may be exposed to hepatitis B.  Haemophilus influenzae type b (Hib) vaccine. You may need this if you have certain conditions.  Talk to your health care provider about which screenings and vaccines you need and how often you need them. This information is not intended to replace advice given to you by your health care provider. Make sure you discuss any questions you have with your health care provider. Document Released: 04/19/2015 Document Revised: 12/11/2015 Document Reviewed: 01/22/2015 Elsevier Interactive Patient Education  [employer].

## 2017-08-02 NOTE — Assessment & Plan Note (Addendum)
Patient encouraged to maintain heart healthy diet, regular exercise, adequate sleep. Consider daily probiotics. Take medications as prescribed. Patient declines Dexa scan, MGM, pap and colonoscopy

## 2017-08-02 NOTE — Progress Notes (Signed)
Subjective:  I acted as a Education administrator for Dr. Charlett Blake. [place], [place]  Patient ID: Karen Hoffman, female    DOB: [birth year], 77 y.o.   MRN: [id number]  No chief complaint on file.   HPI  Patient is in today for an annual exam and follow up on chronic medical concerns. No recent febrile illness or hospitalizations. No bloody or tarry stool. Notes a cough, shortness of birth with tightness in chest especially with coughing. Denies CP/palp/SOB/HA/congestion/fevers/GI or GU c/o. Taking meds as prescribed  Patient Care Team: Mosie Lukes, MD as PCP - General (Family Medicine) Audrie Lia, MD (Dermatology) Mosie Lukes, MD as Consulting Physician (Gynecology) Despina Hick, MD as Consulting Physician (Ophthalmology)   Past Medical History:  Diagnosis Date  . Allergy    improved since move to [place]  . Asthma    mild, intermittent, improved since moving to [place]  . Carcinoma in situ of skin of back   . Cataracts, bilateral   . Cervical cancer screening 06/19/2013  . Elevated BP   . Elevated LFTs 07/02/2010  . Glaucoma   . History of chicken pox 07/02/2010  . History of measles 07/02/2010  . HTN (hypertension) 08/06/2010  . Hyperlipidemia 09/04/2010  . Hypokalemia 11/18/2011  . 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 Medicare annual wellness visit, subsequent 04/21/2011  . Osteoporosis   . Overweight(278.02) 07/02/2010  . Thyroid disease 07/02/2010  . URI (upper respiratory infection) 11/17/2011  . Vitamin D deficiency 06/27/2015    Past Surgical History:  Procedure Laterality Date  . broken ankle  1997   right, screws and plates in place  . left eye surgery    . TONSILLECTOMY    . TRUNK SKIN LESION EXCISIONAL BIOPSY     x twice    Family History  Problem Relation Age of Onset  . Cancer Mother        lung/ smoker  . Heart attack Paternal Grandmother   . Heart disease Father   . Hypertension Father   . Kidney disease Father 35       dialysis  . Obesity Father   .  Diabetes type II Father   . Graves' disease Daughter   . Hypothyroidism Daughter        h/o Hashimoto's now hypothyroid  . Cholecystitis Son 33       cholecystectomy    Social History   Socioeconomic History  . Marital status: Widowed    Spouse name: Not on file  . Number of children: Not on file  . Years of education: Not on file  . Highest education level: Not on file  Occupational History  . Not on file  Social Needs  . Financial resource strain: Not on file  . Food insecurity:    Worry: Not on file    Inability: Not on file  . Transportation needs:    Medical: Not on file    Non-medical: Not on file  Tobacco Use  . Smoking status: Former Smoker    Packs/day: 0.20    Years: 10.00    Pack years: 2.00    Types: Cigarettes    Last attempt to quit: 04/07/1975    Years since quitting: 42.3  . Smokeless tobacco: Never Used  Substance and Sexual Activity  . Alcohol use: Yes    Comment: 1 glass of wine or coctail with dinner  . Drug use: No  . Sexual activity: Never    Comment:  lives with dog, no major dietary restrictions, wears seat  belt  Lifestyle  . Physical activity:    Days per week: Not on file    Minutes per session: Not on file  . Stress: Not on file  Relationships  . Social connections:    Talks on phone: Not on file    Gets together: Not on file    Attends religious service: Not on file    Active member of club or organization: Not on file    Attends meetings of clubs or organizations: Not on file    Relationship status: Not on file  . Intimate partner violence:    Fear of current or ex partner: Not on file    Emotionally abused: Not on file    Physically abused: Not on file    Forced sexual activity: Not on file  Other Topics Concern  . Not on file  Social History Narrative  . Not on file    Outpatient Medications Prior to Visit  Medication Sig Dispense Refill  . albuterol (PROAIR HFA) 108 (90 Base) MCG/ACT inhaler Inhale 2 puffs into the lungs  every 6 (six) hours as needed for wheezing or shortness of breath. 18 g 5  . amLODipine (NORVASC) 5 MG tablet Take 1 tablet (5 mg total) by mouth daily. 30 tablet 3  . benzonatate (TESSALON) 100 MG capsule Take 1 capsule (100 mg total) by mouth 3 (three) times daily as needed for cough. 21 capsule 0  . Calcium Citrate-Vitamin D (CALCIUM CITRATE + PO) Take 1 tablet by mouth daily.    . dorzolamide-timolol (COSOPT) 22.3-6.8 MG/ML ophthalmic solution Place 1 drop into both eyes 2 (two) times daily.   5  . Flaxseed, Linseed, (FLAXSEED OIL) 1000 MG CAPS Take 1 capsule by mouth daily.    . fluticasone (FLONASE) 50 MCG/ACT nasal spray Place 2 sprays into both nostrils daily. 48 g 3  . HYDROcodone-homatropine (HYCODAN) 5-1.5 MG/5ML syrup Take 5 mLs by mouth every 8 (eight) hours as needed. 100 mL 0  . latanoprost (XALATAN) 0.005 % ophthalmic solution Place 1 drop into both eyes at bedtime.   5  . loratadine (CLARITIN) 10 MG tablet Take 10 mg by mouth daily.    . milk thistle 175 MG tablet Take 250 mg by mouth daily.    . Multiple Vitamins-Minerals (CENTRUM ADULTS PO) Take 1 tablet by mouth daily.    . Probiotic Product (ALIGN PO) Take 1 capsule by mouth daily.    . timolol (TIMOPTIC) 0.5 % ophthalmic solution 1 drop 2 (two) times daily.    . vitamin C (ASCORBIC ACID) 500 MG tablet Take 500 mg by mouth daily.    Marland Kitchen doxycycline (VIBRA-TABS) 100 MG tablet Take 1 tablet (100 mg total) by mouth 2 (two) times daily. Can give caps or generic 20 tablet 0  . oseltamivir (TAMIFLU) 75 MG capsule Take 1 capsule (75 mg total) by mouth 2 (two) times daily. 10 capsule 0  . predniSONE (DELTASONE) 10 MG tablet 5 TAB PO DAY 1 4 TAB PO DAY 2 3 TAB PO DAY 3 2 TAB PO DAY 4 1 TAB PO DAY 5 15 tablet 0   No facility-administered medications prior to visit.     Allergies  Allergen Reactions  . Brimonidine Tartrate-Timolol Other (See Comments)    Pt. reports eye redness & itchiness.  . Chlorthalidone Other (See Comments)     Significant hypokalemia  . Losartan     Shortness of breath   .  Ace Inhibitors Cough    Review of Systems  Constitutional: Negative for fever.  HENT: Positive for congestion.   Eyes: Negative for blurred vision.  Respiratory: Positive for cough and shortness of breath.   Cardiovascular: Negative for chest pain and palpitations.  Gastrointestinal: Negative for vomiting.  Musculoskeletal: Negative for back pain.  Skin: Negative for rash.  Neurological: Negative for loss of consciousness and headaches.       Objective:    Physical Exam  Constitutional: She is oriented to person, place, and time. No distress.  HENT:  Head: Normocephalic and atraumatic.  Right Ear: External ear normal.  Left Ear: External ear normal.  Nose: Nose normal.  Mouth/Throat: Oropharynx is clear and moist. No oropharyngeal exudate.  Eyes: Pupils are equal, round, and reactive to light. Conjunctivae are normal. Right eye exhibits no discharge. Left eye exhibits no discharge. No scleral icterus.  Neck: Normal range of motion. Neck supple. No thyromegaly present.  Cardiovascular: Normal rate, regular rhythm, normal heart sounds and intact distal pulses.  No murmur heard. Pulmonary/Chest: Effort normal and breath sounds normal. No respiratory distress. She has no wheezes. She has no rales.  Abdominal: Soft. Bowel sounds are normal. She exhibits no distension and no mass. There is no tenderness.  Musculoskeletal: Normal range of motion. She exhibits no edema or tenderness.  Lymphadenopathy:    She has no cervical adenopathy.  Neurological: She is alert and oriented to person, place, and time. She has normal reflexes. She displays normal reflexes. No cranial nerve deficit. Coordination normal.  Skin: Skin is warm and dry. No rash noted. She is not diaphoretic.    BP (!) 142/70 (BP Location: Left Arm, Patient Position: Sitting, Cuff Size: Normal)   Pulse 64   Temp (!) 97.5 F (36.4 C) (Oral)   Resp 18    Wt 178 lb 12.8 oz (81.1 kg)   BMI 29.75 kg/m  Wt Readings from Last 3 Encounters:  08/02/17 178 lb 12.8 oz (81.1 kg)  05/12/17 183 lb 3.2 oz (83.1 kg)  03/03/17 179 lb 3.2 oz (81.3 kg)   BP Readings from Last 3 Encounters:  08/02/17 (!) 142/70  05/12/17 (!) 121/52  03/03/17 (!) 120/52     Immunization History  Administered Date(s) Administered  . Influenza Split 12/28/2011, 01/29/2016  . Influenza Whole 01/05/2011  . Influenza, High Dose Seasonal PF 12/14/2016  . Influenza,inj,Quad PF,6+ Mos 12/26/2012, 12/14/2013  . Influenza-Unspecified 02/06/2015  . Pneumococcal Conjugate-13 06/19/2013  . Pneumococcal Polysaccharide-23 04/06/2005, 06/27/2015  . Tdap 04/21/2011  . Zoster 11/05/2011    Health Maintenance  Topic Date Due  . DEXA SCAN  06/06/2018 (Originally 10/18/2005)  . INFLUENZA VACCINE  11/04/2017  . TETANUS/TDAP  04/20/2021  . PNA vac Low Risk Adult  Completed    Lab Results  Component Value Date   WBC 7.3 08/02/2017   HGB 15.6 (H) 08/02/2017   HCT 46.2 (H) 08/02/2017   PLT 198.0 08/02/2017   GLUCOSE 94 08/02/2017   CHOL 197 08/02/2017   TRIG 97.0 08/02/2017   HDL 53.20 08/02/2017   LDLDIRECT 131.6 12/12/2012   LDLCALC 124 (H) 08/02/2017   ALT 30 08/02/2017   AST 19 08/02/2017   NA 142 08/02/2017   K 4.2 08/02/2017   CL 106 08/02/2017   CREATININE 0.74 08/02/2017   BUN 20 08/02/2017   CO2 30 08/02/2017   TSH 1.79 08/02/2017    Lab Results  Component Value Date   TSH 1.79 08/02/2017   Lab Results  Component  Value Date   WBC 7.3 08/02/2017   HGB 15.6 (H) 08/02/2017   HCT 46.2 (H) 08/02/2017   MCV 90.9 08/02/2017   PLT 198.0 08/02/2017   Lab Results  Component Value Date   NA 142 08/02/2017   K 4.2 08/02/2017   CO2 30 08/02/2017   GLUCOSE 94 08/02/2017   BUN 20 08/02/2017   CREATININE 0.74 08/02/2017   BILITOT 0.8 08/02/2017   ALKPHOS 83 08/02/2017   AST 19 08/02/2017   ALT 30 08/02/2017   PROT 6.9 08/02/2017   ALBUMIN 4.2  08/02/2017   CALCIUM 9.4 08/02/2017   GFR 80.93 08/02/2017   Lab Results  Component Value Date   CHOL 197 08/02/2017   Lab Results  Component Value Date   HDL 53.20 08/02/2017   Lab Results  Component Value Date   LDLCALC 124 (H) 08/02/2017   Lab Results  Component Value Date   TRIG 97.0 08/02/2017   Lab Results  Component Value Date   CHOLHDL 4 08/02/2017   No results found for: HGBA1C       Assessment & Plan:   Problem List Items Addressed This Visit    Osteoporosis     Recommend calcium intake of 1200 to 1500 mg daily, divided into roughly 3 doses. Best source is the diet and a single dairy serving is about 500 mg, a supplement of calcium citrate once or twice daily to balance diet is fine if not getting enough in diet. Also need Vitamin D 2000 IU caps, 1 cap daily if not already taking vitamin D. Also recommend weight baring exercise on hips and upper body to keep bones strong      Allergy    Loratadine, Flonase and add Singulair      Asthma    Recurrent bronchitis this year every several months, add Singulair and Flovent, continue Albuterol prn. Report if no response for referral to pulmonology, CXR reviewed. Given rf on Doxycycline and Prednisone if worsens.       Relevant Medications   montelukast (SINGULAIR) 10 MG tablet   fluticasone (FLOVENT HFA) 110 MCG/ACT inhaler   predniSONE (DELTASONE) 10 MG tablet   Elevated LFTs    Check cmp      HTN (hypertension)    Well controlled, no changes to meds. Encouraged heart healthy diet such as the DASH diet and exercise as tolerated.       Relevant Orders   CBC (Completed)   Comprehensive metabolic panel (Completed)   TSH (Completed)   Hyperlipidemia    Encouraged heart healthy diet, increase exercise, avoid trans fats, consider a krill oil cap daily      Relevant Orders   Lipid panel (Completed)   Preventative health care    Patient encouraged to maintain heart healthy diet, regular exercise, adequate  sleep. Consider daily probiotics. Take medications as prescribed. Patient declines Dexa scan, MGM, pap and colonoscopy      Vitamin D deficiency    Check level today, takes a daily supplement of Vitamin D2 unclear dose.      Relevant Orders   VITAMIN D 25 Hydroxy (Vit-D Deficiency, Fractures) (Completed)   Cough - Primary    Likely multifactorial. Try Doxycycline bid, treat allergies with multiple meds including antihistamines and nasal serooids         I have discontinued Jobe Marker Laymon's oseltamivir. I am also having her start on montelukast and fluticasone. Additionally, I am having her maintain her dorzolamide-timolol, latanoprost, vitamin C, Calcium Citrate-Vitamin D (CALCIUM  CITRATE + PO), Flaxseed Oil, milk thistle, Multiple Vitamins-Minerals (CENTRUM ADULTS PO), Probiotic Product (ALIGN PO), timolol, loratadine, amLODipine, benzonatate, fluticasone, HYDROcodone-homatropine, albuterol, predniSONE, and doxycycline.  Meds ordered this encounter  Medications  . montelukast (SINGULAIR) 10 MG tablet    Sig: Take 1 tablet (10 mg total) by mouth at bedtime.    Dispense:  30 tablet    Refill:  3  . fluticasone (FLOVENT HFA) 110 MCG/ACT inhaler    Sig: Inhale 2 puffs into the lungs 2 (two) times daily.    Dispense:  1 Inhaler    Refill:  12  . predniSONE (DELTASONE) 10 MG tablet    Sig: 5 TAB PO DAY 1 4 TAB PO DAY 2 3 TAB PO DAY 3 2 TAB PO DAY 4 1 TAB PO DAY 5    Dispense:  15 tablet    Refill:  0  . doxycycline (VIBRA-TABS) 100 MG tablet    Sig: Take 1 tablet (100 mg total) by mouth 2 (two) times daily. Can give caps or generic    Dispense:  20 tablet    Refill:  0    CMA served as scribe during this visit. History, Physical and Plan performed by medical provider. Documentation and orders reviewed and attested to.  Penni Homans, MD

## 2017-08-02 NOTE — Assessment & Plan Note (Signed)
Recommend calcium intake of 1200 to 1500 mg daily, divided into roughly 3 doses. Best source is the diet and a single dairy serving is about 500 mg, a supplement of calcium citrate once or twice daily to balance diet is fine if not getting enough in diet. Also need Vitamin D 2000 IU caps, 1 cap daily if not already taking vitamin D. Also recommend weight baring exercise on hips and upper body to keep bones strong 

## 2017-08-02 NOTE — Assessment & Plan Note (Signed)
Recurrent bronchitis this year every several months, add Singulair and Flovent, continue Albuterol prn. Report if no response for referral to pulmonology, CXR reviewed. Given rf on Doxycycline and Prednisone if worsens.

## 2017-08-02 NOTE — Assessment & Plan Note (Signed)
Check cmp 

## 2017-08-04 DIAGNOSIS — R059 Cough, unspecified: Secondary | ICD-10-CM | POA: Insufficient documentation

## 2017-08-04 DIAGNOSIS — R05 Cough: Secondary | ICD-10-CM | POA: Insufficient documentation

## 2017-08-04 NOTE — Assessment & Plan Note (Signed)
Likely multifactorial. Try Doxycycline bid, treat allergies with multiple meds including antihistamines and nasal serooids

## 2017-08-10 ENCOUNTER — Other Ambulatory Visit: Payer: Self-pay | Admitting: Family Medicine

## 2017-09-03 ENCOUNTER — Other Ambulatory Visit: Payer: Self-pay | Admitting: Medical

## 2017-10-04 DIAGNOSIS — H401123 Primary open-angle glaucoma, left eye, severe stage: Secondary | ICD-10-CM | POA: Diagnosis not present

## 2017-10-27 ENCOUNTER — Encounter: Payer: Self-pay | Admitting: Family Medicine

## 2017-10-27 ENCOUNTER — Telehealth: Payer: Self-pay

## 2017-10-27 DIAGNOSIS — R062 Wheezing: Secondary | ICD-10-CM

## 2017-10-27 MED ORDER — MONTELUKAST SODIUM 10 MG PO TABS: 10.0000 mg | ORAL_TABLET | Freq: Every day | ORAL | 2 refills | Status: DC

## 2017-10-27 NOTE — Telephone Encounter (Signed)
Montelukast refilled per pt. Request.

## 2017-11-18 ENCOUNTER — Other Ambulatory Visit: Payer: Self-pay | Admitting: Family Medicine

## 2017-12-27 DIAGNOSIS — C6932 Malignant neoplasm of left choroid: Secondary | ICD-10-CM | POA: Diagnosis not present

## 2018-01-04 ENCOUNTER — Telehealth: Payer: Self-pay | Admitting: *Deleted

## 2018-01-04 NOTE — Telephone Encounter (Signed)
Received Medical records from [facility]; forwarded to provider/SLS 10/01

## 2018-01-12 DIAGNOSIS — Z23 Encounter for immunization: Secondary | ICD-10-CM | POA: Diagnosis not present

## 2018-01-24 DIAGNOSIS — D225 Melanocytic nevi of trunk: Secondary | ICD-10-CM | POA: Diagnosis not present

## 2018-01-24 DIAGNOSIS — Z85820 Personal history of malignant melanoma of skin: Secondary | ICD-10-CM | POA: Diagnosis not present

## 2018-01-24 DIAGNOSIS — L820 Inflamed seborrheic keratosis: Secondary | ICD-10-CM | POA: Diagnosis not present

## 2018-01-24 DIAGNOSIS — L814 Other melanin hyperpigmentation: Secondary | ICD-10-CM | POA: Diagnosis not present

## 2018-01-24 DIAGNOSIS — L821 Other seborrheic keratosis: Secondary | ICD-10-CM | POA: Diagnosis not present

## 2018-01-24 DIAGNOSIS — D1801 Hemangioma of skin and subcutaneous tissue: Secondary | ICD-10-CM | POA: Diagnosis not present

## 2018-01-31 ENCOUNTER — Ambulatory Visit: Payer: Medicare Other | Admitting: Family Medicine

## 2018-02-07 ENCOUNTER — Encounter: Payer: Self-pay | Admitting: Family Medicine

## 2018-02-07 ENCOUNTER — Ambulatory Visit (INDEPENDENT_AMBULATORY_CARE_PROVIDER_SITE_OTHER): Payer: Medicare Other | Admitting: Family Medicine

## 2018-02-07 DIAGNOSIS — J45909 Unspecified asthma, uncomplicated: Secondary | ICD-10-CM | POA: Diagnosis not present

## 2018-02-07 DIAGNOSIS — T7840XD Allergy, unspecified, subsequent encounter: Secondary | ICD-10-CM | POA: Diagnosis not present

## 2018-02-07 DIAGNOSIS — E782 Mixed hyperlipidemia: Secondary | ICD-10-CM

## 2018-02-07 DIAGNOSIS — R062 Wheezing: Secondary | ICD-10-CM

## 2018-02-07 DIAGNOSIS — M810 Age-related osteoporosis without current pathological fracture: Secondary | ICD-10-CM | POA: Diagnosis not present

## 2018-02-07 DIAGNOSIS — E559 Vitamin D deficiency, unspecified: Secondary | ICD-10-CM

## 2018-02-07 MED ORDER — FLUTICASONE PROPIONATE HFA 110 MCG/ACT IN AERO: 2.0000 | INHALATION_SPRAY | Freq: Two times a day (BID) | RESPIRATORY_TRACT | 1 refills | Status: DC

## 2018-02-07 MED ORDER — MONTELUKAST SODIUM 10 MG PO TABS: 10.0000 mg | ORAL_TABLET | Freq: Every day | ORAL | 1 refills | Status: DC

## 2018-02-07 NOTE — Progress Notes (Signed)
Subjective:    Patient ID: Karen Hoffman, female    DOB: 1940/06/30, 77 y.o.   MRN: [id number]  Chief Complaint  Patient presents with  . Asthma    Follow up on chronic bronchitis   . Hypertension    HPI Patient is in today for follow up. She has had some head and nasal congestion. No fevers chills. No GI or  GU complaints. No trouble with polyuria or polydipsia. Is trying to maintain a heart healthy diet. Denies CP/palp/SOB/HA/congestion/fevers/GI or GU c/o. Taking meds as prescribed Past Medical History:  Diagnosis Date  . Allergy    improved since move to [place]  . Asthma    mild, intermittent, improved since moving to   . Carcinoma in situ of skin of back   . Cataracts, bilateral   . Cervical cancer screening 06/19/2013  . Elevated BP   . Elevated LFTs 07/02/2010  . Glaucoma   . History of chicken pox 07/02/2010  . History of measles 07/02/2010  . HTN (hypertension) 08/06/2010  . Hyperlipidemia 09/04/2010  . Hypokalemia 11/18/2011  . 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 Medicare annual wellness visit, subsequent 04/21/2011  . Osteoporosis   . Overweight(278.02) 07/02/2010  . Thyroid disease 07/02/2010  . URI (upper respiratory infection) 11/17/2011  . Vitamin D deficiency 06/27/2015    Past Surgical History:  Procedure Laterality Date  . broken ankle  1997   right, screws and plates in place  . left eye surgery    . TONSILLECTOMY    . TRUNK SKIN LESION EXCISIONAL BIOPSY     x twice    Family History  Problem Relation Age of Onset  . Cancer Mother        lung/ smoker  . Heart attack Paternal Grandmother   . Heart disease Father   . Hypertension Father   . Kidney disease Father 67       dialysis  . Obesity Father   . Diabetes type II Father   . Graves' disease Daughter   . Hypothyroidism Daughter        h/o Hashimoto's now hypothyroid  . Cholecystitis Son 71       cholecystectomy    Social History   Socioeconomic History  . Marital status:  Widowed    Spouse name: Not on file  . Number of children: Not on file  . Years of education: Not on file  . Highest education level: Not on file  Occupational History  . Not on file  Social Needs  . Financial resource strain: Not on file  . Food insecurity:    Worry: Not on file    Inability: Not on file  . Transportation needs:    Medical: Not on file    Non-medical: Not on file  Tobacco Use  . Smoking status: Former Smoker    Packs/day: 0.20    Years: 10.00    Pack years: 2.00    Types: Cigarettes    Last attempt to quit: 04/07/1975    Years since quitting: 42.8  . Smokeless tobacco: Never Used  Substance and Sexual Activity  . Alcohol use: Yes    Comment: 1 glass of wine or coctail with dinner  . Drug use: No  . Sexual activity: Never    Comment: lives with dog, no major dietary restrictions, wears seat  belt  Lifestyle  . Physical activity:    Days per week: Not on file    Minutes  per session: Not on file  . Stress: Not on file  Relationships  . Social connections:    Talks on phone: Not on file    Gets together: Not on file    Attends religious service: Not on file    Active member of club or organization: Not on file    Attends meetings of clubs or organizations: Not on file    Relationship status: Not on file  . Intimate partner violence:    Fear of current or ex partner: Not on file    Emotionally abused: Not on file    Physically abused: Not on file    Forced sexual activity: Not on file  Other Topics Concern  . Not on file  Social History Narrative  . Not on file    Outpatient Medications Prior to Visit  Medication Sig Dispense Refill  . albuterol (PROAIR HFA) 108 (90 Base) MCG/ACT inhaler Inhale 2 puffs into the lungs every 6 (six) hours as needed for wheezing or shortness of breath. 18 g 5  . albuterol (PROAIR HFA) 108 (90 Base) MCG/ACT inhaler INHALE 2 PUFFS INTO THE LUNGS EVERY 6 HOURS AS NEEDED FOR WHEEZING OR SHORTNESS OF BREATH 8.5 Inhaler 3    . amLODipine (NORVASC) 5 MG tablet TAKE 1 TABLET BY MOUTH EVERY DAY 90 tablet 1  . benzonatate (TESSALON) 100 MG capsule Take 1 capsule (100 mg total) by mouth 3 (three) times daily as needed for cough. 21 capsule 0  . Calcium Citrate-Vitamin D (CALCIUM CITRATE + PO) Take 1 tablet by mouth daily.    . dorzolamide-timolol (COSOPT) 22.3-6.8 MG/ML ophthalmic solution Place 1 drop into both eyes 2 (two) times daily.   5  . doxycycline (VIBRA-TABS) 100 MG tablet Take 1 tablet (100 mg total) by mouth 2 (two) times daily. Can give caps or generic 20 tablet 0  . Flaxseed, Linseed, (FLAXSEED OIL) 1000 MG CAPS Take 1 capsule by mouth daily.    . fluticasone (FLONASE) 50 MCG/ACT nasal spray Place 2 sprays into both nostrils daily. 48 g 3  . HYDROcodone-homatropine (HYCODAN) 5-1.5 MG/5ML syrup Take 5 mLs by mouth every 8 (eight) hours as needed. 100 mL 0  . latanoprost (XALATAN) 0.005 % ophthalmic solution Place 1 drop into both eyes at bedtime.   5  . loratadine (CLARITIN) 10 MG tablet Take 10 mg by mouth daily.    . milk thistle 175 MG tablet Take 250 mg by mouth daily.    . Multiple Vitamins-Minerals (CENTRUM ADULTS PO) Take 1 tablet by mouth daily.    . predniSONE (DELTASONE) 10 MG tablet 5 TAB PO DAY 1 4 TAB PO DAY 2 3 TAB PO DAY 3 2 TAB PO DAY 4 1 TAB PO DAY 5 15 tablet 0  . Probiotic Product (ALIGN PO) Take 1 capsule by mouth daily.    . timolol (TIMOPTIC) 0.5 % ophthalmic solution 1 drop 2 (two) times daily.    . vitamin C (ASCORBIC ACID) 500 MG tablet Take 500 mg by mouth daily.    . fluticasone (FLOVENT HFA) 110 MCG/ACT inhaler Inhale 2 puffs into the lungs 2 (two) times daily. 1 Inhaler 12  . montelukast (SINGULAIR) 10 MG tablet Take 1 tablet (10 mg total) by mouth at bedtime. 30 tablet 2   No facility-administered medications prior to visit.     Allergies  Allergen Reactions  . Brimonidine Tartrate-Timolol Other (See Comments)    Pt. reports eye redness & itchiness.  . Chlorthalidone  Other (See  Comments)    Significant hypokalemia  . Losartan     Shortness of breath   . Ace Inhibitors Cough    Review of Systems  Constitutional: Negative for fever and malaise/fatigue.  HENT: Negative for congestion.   Eyes: Positive for discharge. Negative for blurred vision and pain.  Respiratory: Negative for shortness of breath.   Cardiovascular: Negative for chest pain, palpitations and leg swelling.  Gastrointestinal: Negative for abdominal pain, blood in stool and nausea.  Genitourinary: Negative for dysuria and frequency.  Musculoskeletal: Negative for falls.  Skin: Negative for rash.  Neurological: Negative for dizziness, loss of consciousness and headaches.  Endo/Heme/Allergies: Negative for environmental allergies.  Psychiatric/Behavioral: Negative for depression. The patient is not nervous/anxious.        Objective:    Physical Exam  Constitutional: She is oriented to person, place, and time. She appears well-developed and well-nourished. No distress.  HENT:  Head: Normocephalic and atraumatic.  Eyes: Conjunctivae are normal.  Neck: Neck supple. No thyromegaly present.  Cardiovascular: Normal rate, regular rhythm and normal heart sounds.  No murmur heard. Pulmonary/Chest: Effort normal and breath sounds normal. No respiratory distress.  Abdominal: Soft. Bowel sounds are normal. She exhibits no distension and no mass. There is no tenderness.  Musculoskeletal: She exhibits no edema.  Lymphadenopathy:    She has no cervical adenopathy.  Neurological: She is alert and oriented to person, place, and time.  Skin: Skin is warm and dry.  Psychiatric: She has a normal mood and affect. Her behavior is normal.    BP 138/78 (BP Location: Left Arm, Patient Position: Sitting, Cuff Size: Small)   Pulse 70   Temp 98 F (36.7 C) (Oral)   Resp 16   Ht 5\' 5"  (1.651 m)   Wt 189 lb (85.7 kg)   SpO2 98%   BMI 31.45 kg/m  Wt Readings from Last 3 Encounters:  02/07/18 189  lb (85.7 kg)  08/02/17 178 lb 12.8 oz (81.1 kg)  05/12/17 183 lb 3.2 oz (83.1 kg)     Lab Results  Component Value Date   WBC 7.3 08/02/2017   HGB 15.6 (H) 08/02/2017   HCT 46.2 (H) 08/02/2017   PLT 198.0 08/02/2017   GLUCOSE 94 08/02/2017   CHOL 197 08/02/2017   TRIG 97.0 08/02/2017   HDL 53.20 08/02/2017   LDLDIRECT 131.6 12/12/2012   LDLCALC 124 (H) 08/02/2017   ALT 30 08/02/2017   AST 19 08/02/2017   NA 142 08/02/2017   K 4.2 08/02/2017   CL 106 08/02/2017   CREATININE 0.74 08/02/2017   BUN 20 08/02/2017   CO2 30 08/02/2017   TSH 1.79 08/02/2017    Lab Results  Component Value Date   TSH 1.79 08/02/2017   Lab Results  Component Value Date   WBC 7.3 08/02/2017   HGB 15.6 (H) 08/02/2017   HCT 46.2 (H) 08/02/2017   MCV 90.9 08/02/2017   PLT 198.0 08/02/2017   Lab Results  Component Value Date   NA 142 08/02/2017   K 4.2 08/02/2017   CO2 30 08/02/2017   GLUCOSE 94 08/02/2017   BUN 20 08/02/2017   CREATININE 0.74 08/02/2017   BILITOT 0.8 08/02/2017   ALKPHOS 83 08/02/2017   AST 19 08/02/2017   ALT 30 08/02/2017   PROT 6.9 08/02/2017   ALBUMIN 4.2 08/02/2017   CALCIUM 9.4 08/02/2017   GFR 80.93 08/02/2017   Lab Results  Component Value Date   CHOL 197 08/02/2017   Lab Results  Component Value Date   HDL 53.20 08/02/2017   Lab Results  Component Value Date   LDLCALC 124 (H) 08/02/2017   Lab Results  Component Value Date   TRIG 97.0 08/02/2017   Lab Results  Component Value Date   CHOLHDL 4 08/02/2017   No results found for: HGBA1C     Assessment & Plan:   Problem List Items Addressed This Visit    Osteoporosis    Encouraged to get adequate exercise, calcium and vitamin d intake      Allergy    Try daily flonase and antihistamin      Asthma    Flovent 2 puffs twice daily, Singulair daily, Loratadine twice daily. For mild congestion Mucinex twice daily prn      Relevant Medications   fluticasone (FLOVENT HFA) 110 MCG/ACT  inhaler   montelukast (SINGULAIR) 10 MG tablet   Hyperlipidemia    Encouraged heart healthy diet, increase exercise, avoid trans fats, consider a krill oil cap daily      Vitamin D deficiency    Supplement and monitor       Other Visit Diagnoses    Wheezing       Relevant Medications   montelukast (SINGULAIR) 10 MG tablet      I am having Alycia Patten maintain her dorzolamide-timolol, latanoprost, vitamin C, Calcium Citrate-Vitamin D (CALCIUM CITRATE + PO), Flaxseed Oil, milk thistle, Multiple Vitamins-Minerals (CENTRUM ADULTS PO), Probiotic Product (ALIGN PO), timolol, loratadine, benzonatate, fluticasone, HYDROcodone-homatropine, albuterol, predniSONE, doxycycline, albuterol, amLODipine, fluticasone, and montelukast.  Meds ordered this encounter  Medications  . fluticasone (FLOVENT HFA) 110 MCG/ACT inhaler    Sig: Inhale 2 puffs into the lungs 2 (two) times daily.    Dispense:  3 Inhaler    Refill:  1  . montelukast (SINGULAIR) 10 MG tablet    Sig: Take 1 tablet (10 mg total) by mouth at bedtime.    Dispense:  90 tablet    Refill:  1     Penni Homans, MD

## 2018-02-07 NOTE — Assessment & Plan Note (Signed)
Flovent 2 puffs twice daily, Singulair daily, Loratadine twice daily. For mild congestion Mucinex twice daily prn

## 2018-02-07 NOTE — Patient Instructions (Signed)
Zinc, Elderberry and Vitamin C daily.  Mucinex twice daily Hypertension Hypertension, commonly called high blood pressure, is when the force of blood pumping through the arteries is too strong. The arteries are the blood vessels that carry blood from the heart throughout the body. Hypertension forces the heart to work harder to pump blood and may cause arteries to become narrow or stiff. Having untreated or uncontrolled hypertension can cause heart attacks, strokes, kidney disease, and other problems. A blood pressure reading consists of a higher number over a lower number. Ideally, your blood pressure should be below 120/80. The first ("top") number is called the systolic pressure. It is a measure of the pressure in your arteries as your heart beats. The second ("bottom") number is called the diastolic pressure. It is a measure of the pressure in your arteries as the heart relaxes. What are the causes? The cause of this condition is not known. What increases the risk? Some risk factors for high blood pressure are under your control. Others are not. Factors you can change  Smoking.  Having type 2 diabetes mellitus, high cholesterol, or both.  Not getting enough exercise or physical activity.  Being overweight.  Having too much fat, sugar, calories, or salt (sodium) in your diet.  Drinking too much alcohol. Factors that are difficult or impossible to change  Having chronic kidney disease.  Having a family history of high blood pressure.  Age. Risk increases with age.  Race. You may be at higher risk if you are African-American.  Gender. Men are at higher risk than women before age 31. After age 68, women are at higher risk than men.  Having obstructive sleep apnea.  Stress. What are the signs or symptoms? Extremely high blood pressure (hypertensive crisis) may cause:  Headache.  Anxiety.  Shortness of breath.  Nosebleed.  Nausea and vomiting.  Severe chest  pain.  Jerky movements you cannot control (seizures).  How is this diagnosed? This condition is diagnosed by measuring your blood pressure while you are seated, with your arm resting on a surface. The cuff of the blood pressure monitor will be placed directly against the skin of your upper arm at the level of your heart. It should be measured at least twice using the same arm. Certain conditions can cause a difference in blood pressure between your right and left arms. Certain factors can cause blood pressure readings to be lower or higher than normal (elevated) for a short period of time:  When your blood pressure is higher when you are in a health care provider's office than when you are at home, this is called white coat hypertension. Most people with this condition do not need medicines.  When your blood pressure is higher at home than when you are in a health care provider's office, this is called masked hypertension. Most people with this condition may need medicines to control blood pressure.  If you have a high blood pressure reading during one visit or you have normal blood pressure with other risk factors:  You may be asked to return on a different day to have your blood pressure checked again.  You may be asked to monitor your blood pressure at home for 1 week or longer.  If you are diagnosed with hypertension, you may have other blood or imaging tests to help your health care provider understand your overall risk for other conditions. How is this treated? This condition is treated by making healthy lifestyle changes, such as eating  healthy foods, exercising more, and reducing your alcohol intake. Your health care provider may prescribe medicine if lifestyle changes are not enough to get your blood pressure under control, and if:  Your systolic blood pressure is above 130.  Your diastolic blood pressure is above 80.  Your personal target blood pressure may vary depending on your  medical conditions, your age, and other factors. Follow these instructions at home: Eating and drinking  Eat a diet that is high in fiber and potassium, and low in sodium, added sugar, and fat. An example eating plan is called the DASH (Dietary Approaches to Stop Hypertension) diet. To eat this way: ? Eat plenty of fresh fruits and vegetables. Try to fill half of your plate at each meal with fruits and vegetables. ? Eat whole grains, such as whole wheat pasta, brown rice, or whole grain bread. Fill about one quarter of your plate with whole grains. ? Eat or drink low-fat dairy products, such as skim milk or low-fat yogurt. ? Avoid fatty cuts of meat, processed or cured meats, and poultry with skin. Fill about one quarter of your plate with lean proteins, such as fish, chicken without skin, beans, eggs, and tofu. ? Avoid premade and processed foods. These tend to be higher in sodium, added sugar, and fat.  Reduce your daily sodium intake. Most people with hypertension should eat less than 1,500 mg of sodium a day.  Limit alcohol intake to no more than 1 drink a day for nonpregnant women and 2 drinks a day for men. One drink equals 12 oz of beer, 5 oz of wine, or 1 oz of hard liquor. Lifestyle  Work with your health care provider to maintain a healthy body weight or to lose weight. Ask what an ideal weight is for you.  Get at least 30 minutes of exercise that causes your heart to beat faster (aerobic exercise) most days of the week. Activities may include walking, swimming, or biking.  Include exercise to strengthen your muscles (resistance exercise), such as pilates or lifting weights, as part of your weekly exercise routine. Try to do these types of exercises for 30 minutes at least 3 days a week.  Do not use any products that contain nicotine or tobacco, such as cigarettes and e-cigarettes. If you need help quitting, ask your health care provider.  Monitor your blood pressure at home as  told by your health care provider.  Keep all follow-up visits as told by your health care provider. This is important. Medicines  Take over-the-counter and prescription medicines only as told by your health care provider. Follow directions carefully. Blood pressure medicines must be taken as prescribed.  Do not skip doses of blood pressure medicine. Doing this puts you at risk for problems and can make the medicine less effective.  Ask your health care provider about side effects or reactions to medicines that you should watch for. Contact a health care provider if:  You think you are having a reaction to a medicine you are taking.  You have headaches that keep coming back (recurring).  You feel dizzy.  You have swelling in your ankles.  You have trouble with your vision. Get help right away if:  You develop a severe headache or confusion.  You have unusual weakness or numbness.  You feel faint.  You have severe pain in your chest or abdomen.  You vomit repeatedly.  You have trouble breathing. Summary  Hypertension is when the force of blood pumping through  your arteries is too strong. If this condition is not controlled, it may put you at risk for serious complications.  Your personal target blood pressure may vary depending on your medical conditions, your age, and other factors. For most people, a normal blood pressure is less than 120/80.  Hypertension is treated with lifestyle changes, medicines, or a combination of both. Lifestyle changes include weight loss, eating a healthy, low-sodium diet, exercising more, and limiting alcohol. This information is not intended to replace advice given to you by your health care provider. Make sure you discuss any questions you have with your health care provider. Document Released: 03/23/2005 Document Revised: 02/19/2016 Document Reviewed: 02/19/2016 Elsevier Interactive Patient Education  [employer].

## 2018-02-07 NOTE — Assessment & Plan Note (Signed)
Supplement and monitor 

## 2018-02-07 NOTE — Assessment & Plan Note (Signed)
Try daily flonase and antihistamin

## 2018-02-07 NOTE — Assessment & Plan Note (Signed)
Encouraged to get adequate exercise, calcium and vitamin d intake 

## 2018-02-07 NOTE — Assessment & Plan Note (Signed)
Encouraged heart healthy diet, increase exercise, avoid trans fats, consider a krill oil cap daily 

## 2018-03-15 DIAGNOSIS — H401112 Primary open-angle glaucoma, right eye, moderate stage: Secondary | ICD-10-CM | POA: Diagnosis not present

## 2018-03-15 DIAGNOSIS — H401123 Primary open-angle glaucoma, left eye, severe stage: Secondary | ICD-10-CM | POA: Diagnosis not present

## 2018-05-08 ENCOUNTER — Other Ambulatory Visit: Payer: Self-pay | Admitting: Family Medicine

## 2018-07-26 ENCOUNTER — Other Ambulatory Visit: Payer: Self-pay | Admitting: Family Medicine

## 2018-07-26 DIAGNOSIS — R062 Wheezing: Secondary | ICD-10-CM

## 2018-09-13 DIAGNOSIS — H04123 Dry eye syndrome of bilateral lacrimal glands: Secondary | ICD-10-CM | POA: Diagnosis not present

## 2018-09-13 DIAGNOSIS — H401112 Primary open-angle glaucoma, right eye, moderate stage: Secondary | ICD-10-CM | POA: Diagnosis not present

## 2018-09-13 DIAGNOSIS — H401123 Primary open-angle glaucoma, left eye, severe stage: Secondary | ICD-10-CM | POA: Diagnosis not present

## 2018-11-03 ENCOUNTER — Other Ambulatory Visit: Payer: Self-pay

## 2018-12-23 ENCOUNTER — Other Ambulatory Visit: Payer: Self-pay | Admitting: Family Medicine

## 2019-01-09 DIAGNOSIS — Z23 Encounter for immunization: Secondary | ICD-10-CM | POA: Diagnosis not present

## 2019-01-19 ENCOUNTER — Other Ambulatory Visit: Payer: Self-pay | Admitting: Family Medicine

## 2019-01-19 DIAGNOSIS — R062 Wheezing: Secondary | ICD-10-CM

## 2019-03-14 DIAGNOSIS — H401112 Primary open-angle glaucoma, right eye, moderate stage: Secondary | ICD-10-CM | POA: Diagnosis not present

## 2019-03-14 DIAGNOSIS — H401123 Primary open-angle glaucoma, left eye, severe stage: Secondary | ICD-10-CM | POA: Diagnosis not present

## 2019-04-27 DIAGNOSIS — D225 Melanocytic nevi of trunk: Secondary | ICD-10-CM | POA: Diagnosis not present

## 2019-04-27 DIAGNOSIS — R208 Other disturbances of skin sensation: Secondary | ICD-10-CM | POA: Diagnosis not present

## 2019-04-27 DIAGNOSIS — D1801 Hemangioma of skin and subcutaneous tissue: Secondary | ICD-10-CM | POA: Diagnosis not present

## 2019-04-27 DIAGNOSIS — L821 Other seborrheic keratosis: Secondary | ICD-10-CM | POA: Diagnosis not present

## 2019-04-27 DIAGNOSIS — Z85820 Personal history of malignant melanoma of skin: Secondary | ICD-10-CM | POA: Diagnosis not present

## 2019-04-27 DIAGNOSIS — L218 Other seborrheic dermatitis: Secondary | ICD-10-CM | POA: Diagnosis not present

## 2019-04-27 DIAGNOSIS — L820 Inflamed seborrheic keratosis: Secondary | ICD-10-CM | POA: Diagnosis not present

## 2019-04-27 DIAGNOSIS — L814 Other melanin hyperpigmentation: Secondary | ICD-10-CM | POA: Diagnosis not present

## 2019-04-27 DIAGNOSIS — L570 Actinic keratosis: Secondary | ICD-10-CM | POA: Diagnosis not present

## 2019-04-27 DIAGNOSIS — L905 Scar conditions and fibrosis of skin: Secondary | ICD-10-CM | POA: Diagnosis not present

## 2019-05-16 IMAGING — DX DG CHEST 2V
2 series · 2 of 2 positions shown · non-contrast
Comparison: 05/22/2013

CLINICAL DATA: Cough and wheezing and shortness of breath.

EXAM:
CHEST  2 VIEW

[chest pa]
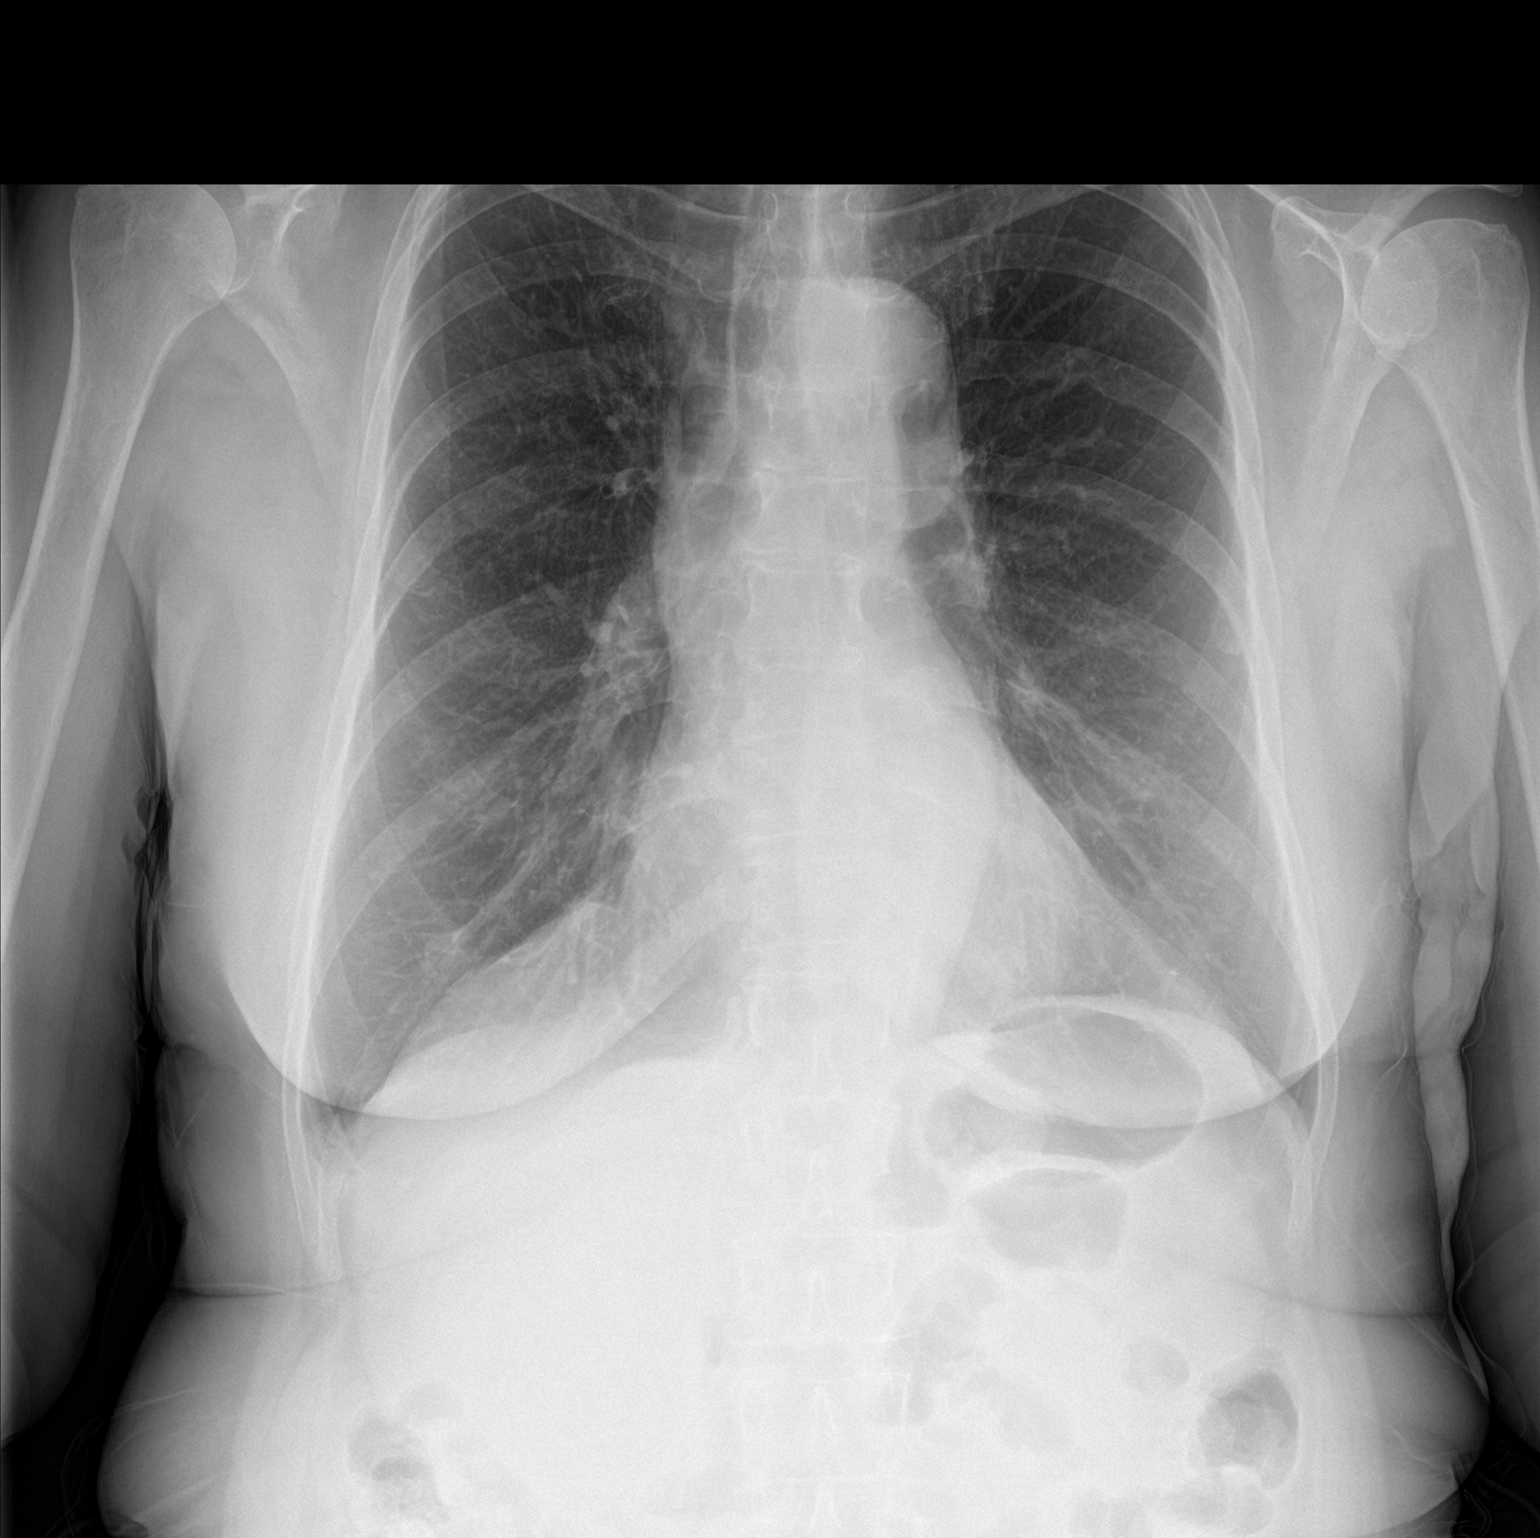

[chest lat]
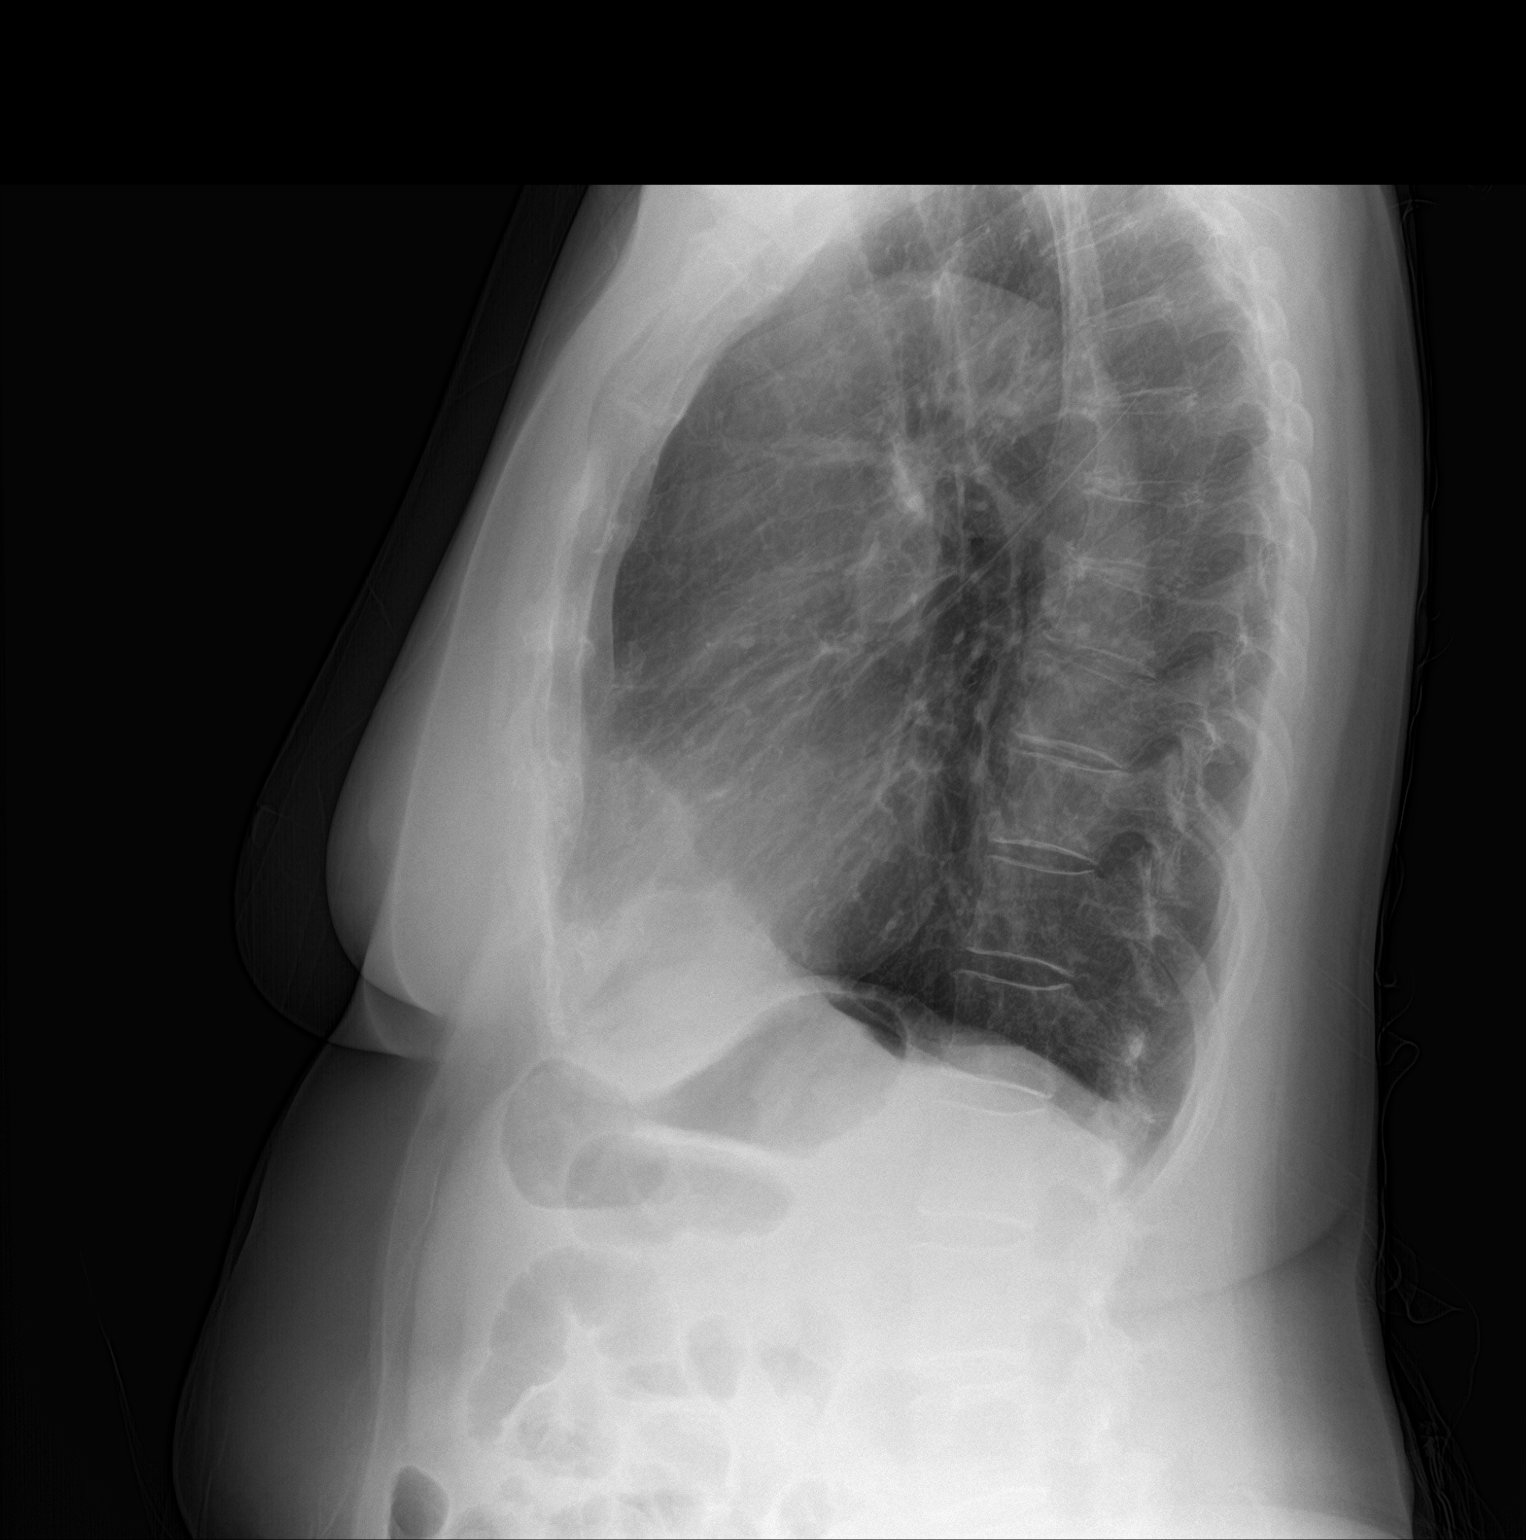

[2 of 2 positions shown; findings below may reference images not displayed]

FINDINGS: The heart size and mediastinal contours are within normal limits.
Both lungs are clear. The visualized skeletal structures are
unremarkable.
IMPRESSION: Normal exam.

## 2019-07-12 ENCOUNTER — Other Ambulatory Visit: Payer: Self-pay | Admitting: Family Medicine

## 2019-08-09 ENCOUNTER — Other Ambulatory Visit: Payer: Self-pay | Admitting: Family Medicine

## 2019-08-09 DIAGNOSIS — R062 Wheezing: Secondary | ICD-10-CM

## 2019-08-10 ENCOUNTER — Other Ambulatory Visit: Payer: Self-pay | Admitting: Family Medicine

## 2019-09-11 DIAGNOSIS — H401123 Primary open-angle glaucoma, left eye, severe stage: Secondary | ICD-10-CM | POA: Diagnosis not present

## 2019-09-11 DIAGNOSIS — H401112 Primary open-angle glaucoma, right eye, moderate stage: Secondary | ICD-10-CM | POA: Diagnosis not present

## 2019-10-26 DIAGNOSIS — D225 Melanocytic nevi of trunk: Secondary | ICD-10-CM | POA: Diagnosis not present

## 2019-10-26 DIAGNOSIS — L820 Inflamed seborrheic keratosis: Secondary | ICD-10-CM | POA: Diagnosis not present

## 2019-10-26 DIAGNOSIS — Z85820 Personal history of malignant melanoma of skin: Secondary | ICD-10-CM | POA: Diagnosis not present

## 2019-10-26 DIAGNOSIS — L905 Scar conditions and fibrosis of skin: Secondary | ICD-10-CM | POA: Diagnosis not present

## 2019-10-26 DIAGNOSIS — L821 Other seborrheic keratosis: Secondary | ICD-10-CM | POA: Diagnosis not present

## 2019-12-18 DIAGNOSIS — C6932 Malignant neoplasm of left choroid: Secondary | ICD-10-CM | POA: Diagnosis not present

## 2020-01-19 ENCOUNTER — Other Ambulatory Visit: Payer: Self-pay | Admitting: Family Medicine

## 2020-01-19 DIAGNOSIS — R062 Wheezing: Secondary | ICD-10-CM

## 2020-03-12 DIAGNOSIS — H401123 Primary open-angle glaucoma, left eye, severe stage: Secondary | ICD-10-CM | POA: Diagnosis not present

## 2020-03-12 DIAGNOSIS — H401112 Primary open-angle glaucoma, right eye, moderate stage: Secondary | ICD-10-CM | POA: Diagnosis not present

## 2020-03-14 DIAGNOSIS — Z23 Encounter for immunization: Secondary | ICD-10-CM | POA: Diagnosis not present

## 2020-04-30 DIAGNOSIS — Z85820 Personal history of malignant melanoma of skin: Secondary | ICD-10-CM | POA: Diagnosis not present

## 2020-04-30 DIAGNOSIS — L821 Other seborrheic keratosis: Secondary | ICD-10-CM | POA: Diagnosis not present

## 2020-04-30 DIAGNOSIS — L570 Actinic keratosis: Secondary | ICD-10-CM | POA: Diagnosis not present

## 2020-04-30 DIAGNOSIS — L905 Scar conditions and fibrosis of skin: Secondary | ICD-10-CM | POA: Diagnosis not present

## 2020-04-30 DIAGNOSIS — L658 Other specified nonscarring hair loss: Secondary | ICD-10-CM | POA: Diagnosis not present

## 2020-04-30 DIAGNOSIS — L814 Other melanin hyperpigmentation: Secondary | ICD-10-CM | POA: Diagnosis not present

## 2020-04-30 DIAGNOSIS — D1801 Hemangioma of skin and subcutaneous tissue: Secondary | ICD-10-CM | POA: Diagnosis not present

## 2020-04-30 DIAGNOSIS — D225 Melanocytic nevi of trunk: Secondary | ICD-10-CM | POA: Diagnosis not present

## 2020-04-30 DIAGNOSIS — L820 Inflamed seborrheic keratosis: Secondary | ICD-10-CM | POA: Diagnosis not present

## 2020-07-12 ENCOUNTER — Other Ambulatory Visit: Payer: Self-pay | Admitting: Family Medicine

## 2020-07-12 DIAGNOSIS — R062 Wheezing: Secondary | ICD-10-CM

## 2020-07-16 ENCOUNTER — Ambulatory Visit (INDEPENDENT_AMBULATORY_CARE_PROVIDER_SITE_OTHER): Payer: Medicare Other | Admitting: Medical

## 2020-07-16 ENCOUNTER — Encounter: Payer: Self-pay | Admitting: Medical

## 2020-07-16 ENCOUNTER — Other Ambulatory Visit: Payer: Self-pay

## 2020-07-16 VITALS — BP 130/75 | HR 62 | Resp 18 | Ht 65.0 in | Wt 172.0 lb

## 2020-07-16 DIAGNOSIS — R739 Hyperglycemia, unspecified: Secondary | ICD-10-CM

## 2020-07-16 DIAGNOSIS — E782 Mixed hyperlipidemia: Secondary | ICD-10-CM

## 2020-07-16 DIAGNOSIS — J45909 Unspecified asthma, uncomplicated: Secondary | ICD-10-CM

## 2020-07-16 DIAGNOSIS — J309 Allergic rhinitis, unspecified: Secondary | ICD-10-CM

## 2020-07-16 DIAGNOSIS — I10 Essential (primary) hypertension: Secondary | ICD-10-CM | POA: Diagnosis not present

## 2020-07-16 MED ORDER — AMLODIPINE BESYLATE 5 MG PO TABS: 1.0000 | ORAL_TABLET | Freq: Every day | ORAL | 1 refills | Status: DC

## 2020-07-16 MED ORDER — FLOVENT HFA 110 MCG/ACT IN AERO: INHALATION_SPRAY | RESPIRATORY_TRACT | 1 refills | Status: DC

## 2020-07-16 MED ORDER — MONTELUKAST SODIUM 10 MG PO TABS: 10.0000 mg | ORAL_TABLET | Freq: Every day | ORAL | 1 refills | Status: DC

## 2020-07-16 NOTE — Progress Notes (Signed)
Subjective:    Patient ID: Karen Hoffman, female    DOB: 1940-12-05, 80 y.o.   MRN: [id number]  HPI  Pt in for follow up.  Has not been in for some time due to pandemic.  Pt has htn. BP consistent controlled when she checks at home and at [facility].   Pt has history of allergies and asthma history. Pt has used flovent, albuterol and montelukast in the past.  Mild elevated ldl in the past.  Mild elevated sugar in the past.    Review of Systems  Constitutional: Negative for chills, fatigue and fever.  HENT: Negative for congestion, ear pain, facial swelling and postnasal drip.   Respiratory: Negative for cough, chest tightness, shortness of breath and wheezing.   Cardiovascular: Negative for chest pain and palpitations.  Gastrointestinal: Negative for abdominal pain and blood in stool.  Genitourinary: Negative for difficulty urinating, dysuria and flank pain.  Musculoskeletal: Negative for back pain.  Skin: Negative for rash.  Neurological: Negative for dizziness and headaches.  Hematological: Negative for adenopathy. Does not bruise/bleed easily.  Psychiatric/Behavioral: Negative for behavioral problems and decreased concentration.     Past Medical History:  Diagnosis Date  . Allergy    improved since move to [place]  . Asthma    mild, intermittent, improved since moving to [place]  . Carcinoma in situ of skin of back   . Cataracts, bilateral   . Cervical cancer screening 06/19/2013  . Elevated BP   . Elevated LFTs 07/02/2010  . Glaucoma   . History of chicken pox 07/02/2010  . History of measles 07/02/2010  . HTN (hypertension) 08/06/2010  . Hyperlipidemia 09/04/2010  . Hypokalemia 11/18/2011  . Malignant melanoma of eye ([place])    left   . Medicare annual wellness visit, subsequent 04/21/2011  . Osteoporosis   . Overweight(278.02) 07/02/2010  . Thyroid disease 07/02/2010  . URI (upper respiratory infection) 11/17/2011  . Vitamin D deficiency 06/27/2015     Social History    Socioeconomic History  . Marital status: Widowed    Spouse name: Not on file  . Number of children: Not on file  . Years of education: Not on file  . Highest education level: Not on file  Occupational History  . Not on file  Tobacco Use  . Smoking status: Former Smoker    Packs/day: 0.20    Years: 10.00    Pack years: 2.00    Types: Cigarettes    Quit date: 04/07/1975    Years since quitting: 45.3  . Smokeless tobacco: Never Used  Substance and Sexual Activity  . Alcohol use: Yes    Comment: 1 glass of wine or coctail with dinner  . Drug use: No  . Sexual activity: Never    Comment: lives with dog, no major dietary restrictions, wears seat  belt  Other Topics Concern  . Not on file  Social History Narrative  . Not on file   Social Determinants of Health   Financial Resource Strain: Not on file  Food Insecurity: Not on file  Transportation Needs: Not on file  Physical Activity: Not on file  Stress: Not on file  Social Connections: Not on file  Intimate Partner Violence: Not on file    Past Surgical History:  Procedure Laterality Date  . broken ankle  1997   right, screws and plates in place  . left eye surgery    . TONSILLECTOMY    . TRUNK SKIN LESION EXCISIONAL BIOPSY  x twice    Family History  Problem Relation Age of Onset  . Cancer Mother        lung/ smoker  . Heart attack Paternal Grandmother   . Heart disease Father   . Hypertension Father   . Kidney disease Father 45       dialysis  . Obesity Father   . Diabetes type II Father   . Graves' disease Daughter   . Hypothyroidism Daughter        h/o Hashimoto's now hypothyroid  . Cholecystitis Son 34       cholecystectomy    Allergies  Allergen Reactions  . Brimonidine Tartrate-Timolol Other (See Comments)    Pt. reports eye redness & itchiness.  . Chlorthalidone Other (See Comments)    Significant hypokalemia  . Losartan     Shortness of breath   . Ace Inhibitors Cough    Current  Outpatient Medications on File Prior to Visit  Medication Sig Dispense Refill  . albuterol (PROAIR HFA) 108 (90 Base) MCG/ACT inhaler Inhale 2 puffs into the lungs every 6 (six) hours as needed for wheezing or shortness of breath. 18 g 5  . albuterol (PROAIR HFA) 108 (90 Base) MCG/ACT inhaler INHALE 2 PUFFS INTO THE LUNGS EVERY 6 HOURS AS NEEDED FOR WHEEZING OR SHORTNESS OF BREATH (Patient not taking: Reported on 07/16/2020) 8.5 Inhaler 3  . amLODipine (NORVASC) 5 MG tablet TAKE 1 TABLET BY MOUTH EVERY DAY 90 tablet 1  . Calcium Citrate-Vitamin D (CALCIUM CITRATE + PO) Take 1 tablet by mouth daily.    . Flaxseed, Linseed, (FLAXSEED OIL) 1000 MG CAPS Take 1 capsule by mouth daily.    . milk thistle 175 MG tablet Take 250 mg by mouth daily.    . Multiple Vitamins-Minerals (CENTRUM ADULTS PO) Take 1 tablet by mouth daily.    . Probiotic Product (ALIGN PO) Take 1 capsule by mouth daily.    . vitamin C (ASCORBIC ACID) 500 MG tablet Take 500 mg by mouth daily.     No current facility-administered medications on file prior to visit.    BP 130/75 Comment: checked yesterday and every day in this range.  Pulse 62   Resp 18   Ht 5\' 5"  (1.651 m)   Wt 172 lb (78 kg)   SpO2 98%   BMI 28.62 kg/m       Objective:   Physical Exam  General Mental Status- Alert. General Appearance- Not in acute distress.   Skin General: Color- Normal Color. Moisture- Normal Moisture.  Neck Carotid Arteries- Normal color. Moisture- Normal Moisture. No carotid bruits. No JVD.  Chest and Lung Exam Auscultation: Breath Sounds:-Normal.  Cardiovascular Auscultation:Rythm- Regular. Murmurs & Other Heart Sounds:Auscultation of the heart reveals- No Murmurs.  Abdomen Inspection:-Inspeection Normal. Palpation/Percussion:Note:No mass. Palpation and Percussion of the abdomen reveal- Non Tender, Non Distended + BS, no rebound or guarding.    Neurologic Cranial Nerve exam:- CN III-XII intact(No nystagmus),  symmetric smile. Strength:- 5/5 equal and symmetric strength both upper and lower extremities.      Assessment & Plan:  Bp is well controlled at home and [facility]. BP always little higher so some white coat component. Continue amlodipine 5 mg daily.  For mild ldl elevation will get lipid panel today.  For elevated sugar get cmp and a1c.  For controlled asthma and allergic rhinitis, refilled montelukast, flovent and continue albuterol inhaler.  Follow up in 6 months or sooner if needed.

## 2020-07-16 NOTE — Patient Instructions (Addendum)
Bp is well controlled at home and [facility]. BP always little higher so some white coat component. Continue amlodipine 5 mg daily.  For mild ldl elevation will get lipid panel today.  For elevated sugar get cmp and a1c.  For controlled asthma and allergic rhinitis, refilled montelukast, flovent and continue albuterol inhaler.  Follow up in 6 months or sooner if needed.

## 2020-07-17 LAB — LIPID PANEL
Cholesterol: 195 mg/dL (ref 0–200)
HDL: 61.2 mg/dL (ref >39.00)
LDL Cholesterol: 115 mg/dL — ABNORMAL HIGH (ref 0–99)
NonHDL: 133.74
Total CHOL/HDL Ratio: 3
Triglycerides: 92 mg/dL (ref 0.0–149.0)
VLDL: 18.4 mg/dL (ref 0.0–40.0)

## 2020-07-17 LAB — COMPREHENSIVE METABOLIC PANEL
ALT: 16 U/L (ref 0–35)
AST: 16 U/L (ref 0–37)
Albumin: 4.2 g/dL (ref 3.5–5.2)
Alkaline Phosphatase: 81 U/L (ref 39–117)
BUN: 20 mg/dL (ref 6–23)
CO2: 30 mEq/L (ref 19–32)
Calcium: 9.8 mg/dL (ref 8.4–10.5)
Chloride: 104 mEq/L (ref 96–112)
Creatinine, Ser: 0.89 mg/dL (ref 0.40–1.20)
GFR: 61.52 mL/min (ref >60.00)
Glucose, Bld: 83 mg/dL (ref 70–99)
Potassium: 4.2 mEq/L (ref 3.5–5.1)
Sodium: 141 mEq/L (ref 135–145)
Total Bilirubin: 0.5 mg/dL (ref 0.2–1.2)
Total Protein: 6.7 g/dL (ref 6.0–8.3)

## 2020-07-17 LAB — HEMOGLOBIN A1C: Hgb A1c MFr Bld: 5.3 % (ref 4.6–6.5)

## 2020-09-30 DIAGNOSIS — H401112 Primary open-angle glaucoma, right eye, moderate stage: Secondary | ICD-10-CM | POA: Diagnosis not present

## 2020-09-30 DIAGNOSIS — H401123 Primary open-angle glaucoma, left eye, severe stage: Secondary | ICD-10-CM | POA: Diagnosis not present

## 2020-10-28 DIAGNOSIS — L648 Other androgenic alopecia: Secondary | ICD-10-CM | POA: Diagnosis not present

## 2020-10-28 DIAGNOSIS — L814 Other melanin hyperpigmentation: Secondary | ICD-10-CM | POA: Diagnosis not present

## 2020-10-28 DIAGNOSIS — D1801 Hemangioma of skin and subcutaneous tissue: Secondary | ICD-10-CM | POA: Diagnosis not present

## 2020-10-28 DIAGNOSIS — Z85820 Personal history of malignant melanoma of skin: Secondary | ICD-10-CM | POA: Diagnosis not present

## 2020-10-28 DIAGNOSIS — D225 Melanocytic nevi of trunk: Secondary | ICD-10-CM | POA: Diagnosis not present

## 2020-10-28 DIAGNOSIS — L570 Actinic keratosis: Secondary | ICD-10-CM | POA: Diagnosis not present

## 2020-10-28 DIAGNOSIS — L820 Inflamed seborrheic keratosis: Secondary | ICD-10-CM | POA: Diagnosis not present

## 2020-10-28 DIAGNOSIS — L905 Scar conditions and fibrosis of skin: Secondary | ICD-10-CM | POA: Diagnosis not present

## 2020-10-28 DIAGNOSIS — L821 Other seborrheic keratosis: Secondary | ICD-10-CM | POA: Diagnosis not present

## 2020-10-28 DIAGNOSIS — L8990 Pressure ulcer of unspecified site, unspecified stage: Secondary | ICD-10-CM | POA: Diagnosis not present

## 2020-12-16 DIAGNOSIS — C6932 Malignant neoplasm of left choroid: Secondary | ICD-10-CM | POA: Diagnosis not present

## 2020-12-23 DIAGNOSIS — L821 Other seborrheic keratosis: Secondary | ICD-10-CM | POA: Diagnosis not present

## 2020-12-23 DIAGNOSIS — L814 Other melanin hyperpigmentation: Secondary | ICD-10-CM | POA: Diagnosis not present

## 2020-12-23 DIAGNOSIS — L570 Actinic keratosis: Secondary | ICD-10-CM | POA: Diagnosis not present

## 2021-01-07 ENCOUNTER — Other Ambulatory Visit: Payer: Self-pay

## 2021-01-07 ENCOUNTER — Encounter: Payer: Self-pay | Admitting: Family Medicine

## 2021-01-07 ENCOUNTER — Ambulatory Visit (INDEPENDENT_AMBULATORY_CARE_PROVIDER_SITE_OTHER): Payer: Medicare Other | Admitting: Family Medicine

## 2021-01-07 VITALS — BP 124/76 | HR 65 | Temp 98.5°F | Resp 16 | Wt 175.2 lb

## 2021-01-07 DIAGNOSIS — R7989 Other specified abnormal findings of blood chemistry: Secondary | ICD-10-CM | POA: Diagnosis not present

## 2021-01-07 DIAGNOSIS — E782 Mixed hyperlipidemia: Secondary | ICD-10-CM

## 2021-01-07 DIAGNOSIS — J45909 Unspecified asthma, uncomplicated: Secondary | ICD-10-CM | POA: Diagnosis not present

## 2021-01-07 DIAGNOSIS — E559 Vitamin D deficiency, unspecified: Secondary | ICD-10-CM

## 2021-01-07 DIAGNOSIS — I10 Essential (primary) hypertension: Secondary | ICD-10-CM | POA: Diagnosis not present

## 2021-01-07 DIAGNOSIS — E079 Disorder of thyroid, unspecified: Secondary | ICD-10-CM | POA: Diagnosis not present

## 2021-01-07 MED ORDER — ALBUTEROL SULFATE HFA 108 (90 BASE) MCG/ACT IN AERS: 2.0000 | INHALATION_SPRAY | Freq: Four times a day (QID) | RESPIRATORY_TRACT | 5 refills | Status: DC | PRN

## 2021-01-07 MED ORDER — AMLODIPINE BESYLATE 5 MG PO TABS: 5.0000 mg | ORAL_TABLET | Freq: Every day | ORAL | 3 refills | Status: DC

## 2021-01-07 MED ORDER — MONTELUKAST SODIUM 10 MG PO TABS: 10.0000 mg | ORAL_TABLET | Freq: Every day | ORAL | 3 refills | Status: DC

## 2021-01-07 MED ORDER — FLUTICASONE PROPIONATE HFA 110 MCG/ACT IN AERO: INHALATION_SPRAY | RESPIRATORY_TRACT | 1 refills | Status: DC

## 2021-01-07 NOTE — Assessment & Plan Note (Signed)
Supplement and monitor 

## 2021-01-07 NOTE — Assessment & Plan Note (Addendum)
Well controlled, no changes to meds. Encouraged heart healthy diet such as the DASH diet and exercise as tolerated.  °

## 2021-01-07 NOTE — Patient Instructions (Addendum)
-Lively hearing aids  -bivalent covid booster available at [facility] downstairs from 9 am to 3 pm or any other pharmacy.  -Shingrix is the new shingles shot, 2 shots over 2-6 months, confirm coverage with insurance and document, then can return here for shots with nurse appt or at [facility] and/or molnupiravir is the new COVID medication we can give you if you get COVID so make sure you test if you have symptoms because we have to treat by day 5 of symptoms for it to be effective. If you are positive let us know so we can treat. If a home test is negative and your symptoms are persistent get a PCR test. Can check testing locations at [email or web address] If you are positive we will make an appointment with [place] and we will send in Paxlovid/molnupiravir if you would like it. Check with your pharmacy before we meet to confirm they have it in stock, if they do not then we can get the prescription at the [facility].   Hearing Loss Hearing loss is a partial or total loss of the ability to hear. This can be temporary or permanent, and it can happen in one or both ears. Medical care is necessary to treat hearing loss properly and to prevent the condition from getting worse. Your hearing may partially or completely come back, depending on what caused your hearing loss and how severe it is. In some cases, hearing loss is permanent. What are the causes? Common causes of hearing loss include: Too much wax in the ear canal. Infection of the ear canal or middle ear. Fluid in the middle ear. Injury to the ear or surrounding area. An object stuck in the ear. A history of prolonged exposure to loud sounds, such as music. Less common causes of hearing loss include: Tumors in the ear. Viral or bacterial infections, such as meningitis. A hole in the eardrum (perforated eardrum). Problems with the hearing nerve that sends signals between the brain and the ear. Certain  medicines. What are the signs or symptoms? Symptoms of this condition may include: Difficulty telling the difference between sounds. Difficulty following a conversation when there is background noise. Lack of response to sounds in your environment. This may be most noticeable when you do not respond to startling sounds. Needing to turn up the volume on the television, radio, or other devices. Ringing in the ears. Dizziness. How is this diagnosed? This condition is diagnosed based on: A physical exam. A hearing test (audiometry). The audiometry test will be performed by a hearing specialist (audiologist). You may also be referred to an ear, nose, and throat (ENT) specialist (otolaryngologist). How is this treated? Treatment for hearing loss may include: Ear wax removal. Medicines to treat or prevent infection (antibiotics). Medicines to reduce inflammation (corticosteroids). Hearing aids for hearing loss related to nerve damage. Follow these instructions at home: If you were prescribed an antibiotic medicine, take it as told by your health care provider. Do not stop taking the antibiotic even if you start to feel better. Take over-the-counter and prescription medicines only as told by your health care provider. Avoid loud noises. Return to your normal activities as told by your health care provider. Ask your health care provider what activities are safe for you. Keep all follow-up visits as told by your health care provider. This is important. Contact a health care provider if: You feel dizzy. You develop new symptoms. You vomit or feel nauseous. You have a fever.  Get help right away if: You develop sudden changes in your vision. You have severe ear pain. You have new or increased weakness. You have a severe headache. Summary Hearing loss is a decreased ability to hear sounds around you. It can be temporary or permanent. Treatment will depend on the cause of your hearing loss. It  may include ear wax removal, medicines, or a hearing aid. Your hearing may partially or completely come back, depending on what caused your hearing loss and how severe it is. Keep all follow-up visits as told by your health care provider. This is important. This information is not intended to replace advice given to you by your health care provider. Make sure you discuss any questions you have with your health care provider. Document Revised: 12/21/2017 Document Reviewed: 12/21/2017 Elsevier Patient Education  [place].

## 2021-01-07 NOTE — Progress Notes (Signed)
Patient ID: Karen Hoffman, female    DOB: 07-Jan-1941  Age: 80 y.o. MRN: [id number]    Subjective:   No chief complaint on file.  Subjective   HPI Karen Hoffman presents for office visit today for follow up on HTN and asthma. She is doing fine and has no febrile illnesses or recent ER visits to report. She is keeping up with her supplements intake and recently started taking biotin for her hair. Denies CP/palp/SOB/HA/congestion/fevers/GI or GU c/o. Taking meds as prescribed.  One puff in the morning of albuterol is enough for her instead of two. She is welling to reduce montelukast from 10 mg to 5 mg since she does not need it as much.  Her hearing is not as good as it used to be.   Review of Systems  Constitutional:  Negative for chills, fatigue and fever.  HENT:  Negative for congestion, rhinorrhea, sinus pressure, sinus pain and sore throat.   Eyes:  Negative for pain.  Respiratory:  Negative for cough and shortness of breath.   Cardiovascular:  Negative for chest pain, palpitations and leg swelling.  Gastrointestinal:  Negative for abdominal pain, blood in stool, diarrhea, nausea and vomiting.  Genitourinary:  Negative for decreased urine volume, flank pain, frequency, vaginal bleeding and vaginal discharge.  Musculoskeletal:  Negative for back pain.  Neurological:  Negative for headaches.   History Past Medical History:  Diagnosis Date   Allergy    improved since move to [place]   Asthma    mild, intermittent, improved since moving to [place]   Carcinoma in situ of skin of back    Cataracts, bilateral    Cervical cancer screening 06/19/2013   Elevated BP    Elevated LFTs 07/02/2010   Glaucoma    History of chicken pox 07/02/2010   History of measles 07/02/2010   HTN (hypertension) 08/06/2010   Hyperlipidemia 09/04/2010   Hypokalemia 11/18/2011   Malignant melanoma of eye ([place])    left    Medicare annual wellness visit, subsequent 04/21/2011   Osteoporosis     Overweight(278.02) 07/02/2010   Thyroid disease 07/02/2010   URI (upper respiratory infection) 11/17/2011   Vitamin D deficiency 06/27/2015    She has a past surgical history that includes broken ankle (1997); Tonsillectomy; left eye surgery; and Trunk skin lesion excisional biopsy.   Her family history includes Cancer in her mother; Cholecystitis (age of onset: 59) in her son; Diabetes type II in her father; Berenice Primas' disease in her daughter; Heart attack in her paternal grandmother; Heart disease in her father; Hypertension in her father; Hypothyroidism in her daughter; Kidney disease (age of onset: 84) in her father; Obesity in her father.She reports that she quit smoking about 45 years ago. Her smoking use included cigarettes. She has a 2.00 pack-year smoking history. She has never used smokeless tobacco. She reports current alcohol use. She reports that she does not use drugs.  Current Outpatient Medications on File Prior to Visit  Medication Sig Dispense Refill   Calcium Citrate-Vitamin D (CALCIUM CITRATE + PO) Take 1 tablet by mouth daily.     dorzolamide-timolol (COSOPT) 22.3-6.8 MG/ML ophthalmic solution 1 drop 2 (two) times daily.     Flaxseed, Linseed, (FLAXSEED OIL) 1000 MG CAPS Take 1 capsule by mouth daily.     Loratadine 10 MG CAPS Take by mouth 2 (two) times daily.     milk thistle 175 MG tablet Take 250 mg by mouth daily.     Multiple Vitamins-Minerals (CENTRUM  ADULTS PO) Take 1 tablet by mouth daily.     Probiotic Product (ALIGN PO) Take 1 capsule by mouth daily.     vitamin B-12 (CYANOCOBALAMIN) 1000 MCG tablet Take 1,000 mcg by mouth daily.     vitamin C (ASCORBIC ACID) 500 MG tablet Take 500 mg by mouth daily.     No current facility-administered medications on file prior to visit.     Objective:  Objective  Physical Exam Constitutional:      General: She is not in acute distress.    Appearance: Normal appearance. She is not ill-appearing or toxic-appearing.  HENT:      Head: Normocephalic and atraumatic.     Right Ear: Tympanic membrane, ear canal and external ear normal.     Left Ear: Tympanic membrane, ear canal and external ear normal.     Nose: No congestion or rhinorrhea.  Eyes:     Extraocular Movements: Extraocular movements intact.     Pupils: Pupils are equal, round, and reactive to light.  Cardiovascular:     Rate and Rhythm: Normal rate and regular rhythm.     Pulses: Normal pulses.     Heart sounds: Normal heart sounds. No murmur heard. Pulmonary:     Effort: Pulmonary effort is normal. No respiratory distress.     Breath sounds: Normal breath sounds. No wheezing, rhonchi or rales.  Abdominal:     General: Bowel sounds are normal.     Palpations: Abdomen is soft. There is no mass.     Tenderness: There is no abdominal tenderness. There is no guarding.     Hernia: No hernia is present.  Musculoskeletal:        General: Normal range of motion.     Cervical back: Normal range of motion and neck supple.  Skin:    General: Skin is warm and dry.  Neurological:     Mental Status: She is alert and oriented to person, place, and time.  Psychiatric:        Behavior: Behavior normal.   BP 124/76   Pulse 65   Temp 98.5 F (36.9 C)   Resp 16   Wt 175 lb 3.2 oz (79.5 kg)   SpO2 97%   BMI 29.15 kg/m  Wt Readings from Last 3 Encounters:  01/07/21 175 lb 3.2 oz (79.5 kg)  07/16/20 172 lb (78 kg)  02/07/18 189 lb (85.7 kg)     Lab Results  Component Value Date   WBC 6.6 01/07/2021   HGB 14.2 01/07/2021   HCT 42.7 01/07/2021   PLT 165.0 01/07/2021   GLUCOSE 109 (H) 01/07/2021   CHOL 202 (H) 01/07/2021   TRIG 79.0 01/07/2021   HDL 68.10 01/07/2021   LDLDIRECT 131.6 12/12/2012   LDLCALC 118 (H) 01/07/2021   ALT 19 01/07/2021   AST 18 01/07/2021   NA 139 01/07/2021   K 4.2 01/07/2021   CL 102 01/07/2021   CREATININE 0.81 01/07/2021   BUN 18 01/07/2021   CO2 30 01/07/2021   TSH 1.64 01/07/2021   HGBA1C 5.3 07/16/2020    DG  Chest 2 View  Result Date: 05/12/2017 CLINICAL DATA:  80 year old female with history of shortness of breath and cough with congestion since Saturday. EXAM: CHEST  2 VIEW COMPARISON:  Chest x-ray 11/18/2016. FINDINGS: Lung volumes are normal. No consolidative airspace disease. No pleural effusions. No pneumothorax. No pulmonary nodule or mass noted. Pulmonary vasculature and the cardiomediastinal silhouette are within normal limits. Atherosclerosis in the thoracic aorta.  IMPRESSION: 1.  No radiographic evidence of acute cardiopulmonary disease. 2. Aortic atherosclerosis. Electronically Signed   By: Vinnie Langton M.D.   On: 05/12/2017 09:16     Assessment & Plan:  Plan    Meds ordered this encounter  Medications   fluticasone (FLOVENT HFA) 110 MCG/ACT inhaler    Sig: TAKE 1-2 PUFFS BY MOUTH TWICE A DAY    Dispense:  36 each    Refill:  1   albuterol (PROAIR HFA) 108 (90 Base) MCG/ACT inhaler    Sig: Inhale 2 puffs into the lungs every 6 (six) hours as needed for wheezing or shortness of breath.    Dispense:  18 g    Refill:  5   amLODipine (NORVASC) 5 MG tablet    Sig: Take 1 tablet (5 mg total) by mouth daily.    Dispense:  90 tablet    Refill:  3   montelukast (SINGULAIR) 10 MG tablet    Sig: Take 1 tablet (10 mg total) by mouth at bedtime.    Dispense:  90 tablet    Refill:  3     Problem List Items Addressed This Visit     Asthma    Refill given on Flovent which controls her breathing. Flovent is now 1 puff twice a day. Albuterol prn      Relevant Medications   fluticasone (FLOVENT HFA) 110 MCG/ACT inhaler   albuterol (PROAIR HFA) 108 (90 Base) MCG/ACT inhaler   montelukast (SINGULAIR) 10 MG tablet   Thyroid disease - Primary   Relevant Orders   TSH (Completed)   Elevated LFTs    Minimize simple carbohydrates, animal fats. monitor      HTN (hypertension)    Well controlled, no changes to meds. Encouraged heart healthy diet such as the DASH diet and exercise as  tolerated.       Relevant Medications   amLODipine (NORVASC) 5 MG tablet   Other Relevant Orders   CBC (Completed)   Comprehensive metabolic panel (Completed)   Hyperlipidemia    Encourage heart healthy diet such as MIND or DASH diet, increase exercise, avoid trans fats, simple carbohydrates and processed foods, consider a krill or fish or flaxseed oil cap daily.       Relevant Medications   amLODipine (NORVASC) 5 MG tablet   Other Relevant Orders   Lipid panel (Completed)   Vitamin D deficiency    Supplement and monitor      Relevant Orders   VITAMIN D 25 Hydroxy (Vit-D Deficiency, Fractures) (Completed)    Follow-up: Return in about 1 year (around 01/07/2022) for annual CPE.  I, Suezanne Jacquet, acting as a scribe for Penni Homans, MD, have documented all relevent documentation on behalf of Penni Homans, MD, as directed by Penni Homans, MD while in the presence of Penni Homans, MD. DO:01/08/21.  I, Mosie Lukes, MD personally performed the services described in this documentation. All medical record entries made by the scribe were at my direction and in my presence. I have reviewed the chart and agree that the record reflects my personal performance and is accurate and complete

## 2021-01-08 LAB — CBC
HCT: 42.7 % (ref 36.0–46.0)
Hemoglobin: 14.2 g/dL (ref 12.0–15.0)
MCHC: 33.3 g/dL (ref 30.0–36.0)
MCV: 91.3 fl (ref 78.0–100.0)
Platelets: 165 10*3/uL (ref 150.0–400.0)
RBC: 4.67 Mil/uL (ref 3.87–5.11)
RDW: 13.7 % (ref 11.5–15.5)
WBC: 6.6 10*3/uL (ref 4.0–10.5)

## 2021-01-08 LAB — COMPREHENSIVE METABOLIC PANEL
ALT: 19 U/L (ref 0–35)
AST: 18 U/L (ref 0–37)
Albumin: 4.2 g/dL (ref 3.5–5.2)
Alkaline Phosphatase: 75 U/L (ref 39–117)
BUN: 18 mg/dL (ref 6–23)
CO2: 30 mEq/L (ref 19–32)
Calcium: 9.6 mg/dL (ref 8.4–10.5)
Chloride: 102 mEq/L (ref 96–112)
Creatinine, Ser: 0.81 mg/dL (ref 0.40–1.20)
GFR: 68.65 mL/min (ref >60.00)
Glucose, Bld: 109 mg/dL — ABNORMAL HIGH (ref 70–99)
Potassium: 4.2 mEq/L (ref 3.5–5.1)
Sodium: 139 mEq/L (ref 135–145)
Total Bilirubin: 0.7 mg/dL (ref 0.2–1.2)
Total Protein: 6.4 g/dL (ref 6.0–8.3)

## 2021-01-08 LAB — LIPID PANEL
Cholesterol: 202 mg/dL — ABNORMAL HIGH (ref 0–200)
HDL: 68.1 mg/dL (ref >39.00)
LDL Cholesterol: 118 mg/dL — ABNORMAL HIGH (ref 0–99)
NonHDL: 133.53
Total CHOL/HDL Ratio: 3
Triglycerides: 79 mg/dL (ref 0.0–149.0)
VLDL: 15.8 mg/dL (ref 0.0–40.0)

## 2021-01-08 LAB — TSH: TSH: 1.64 u[IU]/mL (ref 0.35–5.50)

## 2021-01-08 LAB — VITAMIN D 25 HYDROXY (VIT D DEFICIENCY, FRACTURES): VITD: 63.14 ng/mL (ref 30.00–100.00)

## 2021-01-08 NOTE — Assessment & Plan Note (Signed)
Minimize simple carbohydrates, animal fats. monitor

## 2021-01-08 NOTE — Assessment & Plan Note (Deleted)
On Levothyroxine, continue to monitor 

## 2021-01-08 NOTE — Assessment & Plan Note (Signed)
Refill given on Flovent which controls her breathing. Flovent is now 1 puff twice a day. Albuterol prn

## 2021-01-08 NOTE — Assessment & Plan Note (Signed)
Encourage heart healthy diet such as MIND or DASH diet, increase exercise, avoid trans fats, simple carbohydrates and processed foods, consider a krill or fish or flaxseed oil cap daily.  °

## 2021-01-09 ENCOUNTER — Other Ambulatory Visit (INDEPENDENT_AMBULATORY_CARE_PROVIDER_SITE_OTHER): Payer: Medicare Other

## 2021-01-09 DIAGNOSIS — R739 Hyperglycemia, unspecified: Secondary | ICD-10-CM | POA: Diagnosis not present

## 2021-01-09 LAB — HEMOGLOBIN A1C: Hgb A1c MFr Bld: 5.4 % (ref 4.6–6.5)

## 2021-01-21 DIAGNOSIS — Z23 Encounter for immunization: Secondary | ICD-10-CM | POA: Diagnosis not present

## 2021-02-13 ENCOUNTER — Telehealth: Payer: Self-pay | Admitting: Family Medicine

## 2021-02-13 NOTE — Telephone Encounter (Signed)
Left message for patient to call back and schedule Medicare Annual Wellness Visit (AWV) in office.  ° °If not able to come in office, please offer to do virtually or by telephone.  Left office number and my jabber #[phone]. ° °Due for AWVI ° °Please schedule at anytime with Nurse Health Advisor. °  °

## 2021-04-14 DIAGNOSIS — H401112 Primary open-angle glaucoma, right eye, moderate stage: Secondary | ICD-10-CM | POA: Diagnosis not present

## 2021-04-14 DIAGNOSIS — H26493 Other secondary cataract, bilateral: Secondary | ICD-10-CM | POA: Diagnosis not present

## 2021-04-14 DIAGNOSIS — H401123 Primary open-angle glaucoma, left eye, severe stage: Secondary | ICD-10-CM | POA: Diagnosis not present

## 2021-05-05 DIAGNOSIS — D2261 Melanocytic nevi of right upper limb, including shoulder: Secondary | ICD-10-CM | POA: Diagnosis not present

## 2021-05-05 DIAGNOSIS — L820 Inflamed seborrheic keratosis: Secondary | ICD-10-CM | POA: Diagnosis not present

## 2021-05-05 DIAGNOSIS — L570 Actinic keratosis: Secondary | ICD-10-CM | POA: Diagnosis not present

## 2021-05-05 DIAGNOSIS — L814 Other melanin hyperpigmentation: Secondary | ICD-10-CM | POA: Diagnosis not present

## 2021-05-05 DIAGNOSIS — Z08 Encounter for follow-up examination after completed treatment for malignant neoplasm: Secondary | ICD-10-CM | POA: Diagnosis not present

## 2021-05-05 DIAGNOSIS — D225 Melanocytic nevi of trunk: Secondary | ICD-10-CM | POA: Diagnosis not present

## 2021-05-05 DIAGNOSIS — D485 Neoplasm of uncertain behavior of skin: Secondary | ICD-10-CM | POA: Diagnosis not present

## 2021-05-05 DIAGNOSIS — L538 Other specified erythematous conditions: Secondary | ICD-10-CM | POA: Diagnosis not present

## 2021-05-05 DIAGNOSIS — L821 Other seborrheic keratosis: Secondary | ICD-10-CM | POA: Diagnosis not present

## 2021-05-05 DIAGNOSIS — Z86006 Personal history of melanoma in-situ: Secondary | ICD-10-CM | POA: Diagnosis not present

## 2021-05-06 DIAGNOSIS — D2310 Other benign neoplasm of skin of unspecified eyelid, including canthus: Secondary | ICD-10-CM | POA: Diagnosis not present

## 2021-05-06 DIAGNOSIS — H02834 Dermatochalasis of left upper eyelid: Secondary | ICD-10-CM | POA: Diagnosis not present

## 2021-05-06 DIAGNOSIS — D485 Neoplasm of uncertain behavior of skin: Secondary | ICD-10-CM | POA: Diagnosis not present

## 2021-05-06 DIAGNOSIS — H02831 Dermatochalasis of right upper eyelid: Secondary | ICD-10-CM | POA: Diagnosis not present

## 2021-05-15 ENCOUNTER — Encounter: Payer: Self-pay | Admitting: Family Medicine

## 2021-05-16 ENCOUNTER — Encounter: Payer: Self-pay | Admitting: Family Medicine

## 2021-05-16 ENCOUNTER — Other Ambulatory Visit: Payer: Self-pay

## 2021-05-16 MED ORDER — ALBUTEROL SULFATE HFA 108 (90 BASE) MCG/ACT IN AERS: 2.0000 | INHALATION_SPRAY | Freq: Four times a day (QID) | RESPIRATORY_TRACT | 2 refills | Status: DC | PRN

## 2021-05-28 DIAGNOSIS — H02831 Dermatochalasis of right upper eyelid: Secondary | ICD-10-CM | POA: Diagnosis not present

## 2021-05-28 DIAGNOSIS — B75 Trichinellosis: Secondary | ICD-10-CM | POA: Diagnosis not present

## 2021-05-28 DIAGNOSIS — H02834 Dermatochalasis of left upper eyelid: Secondary | ICD-10-CM | POA: Diagnosis not present

## 2021-05-28 DIAGNOSIS — H02054 Trichiasis without entropian left upper eyelid: Secondary | ICD-10-CM | POA: Diagnosis not present

## 2021-10-27 DIAGNOSIS — H401112 Primary open-angle glaucoma, right eye, moderate stage: Secondary | ICD-10-CM | POA: Diagnosis not present

## 2021-10-27 DIAGNOSIS — H401123 Primary open-angle glaucoma, left eye, severe stage: Secondary | ICD-10-CM | POA: Diagnosis not present

## 2021-12-15 DIAGNOSIS — C6932 Malignant neoplasm of left choroid: Secondary | ICD-10-CM | POA: Diagnosis not present

## 2021-12-25 ENCOUNTER — Other Ambulatory Visit: Payer: Self-pay | Admitting: Family Medicine

## 2021-12-25 ENCOUNTER — Encounter: Payer: Self-pay | Admitting: Family Medicine

## 2021-12-27 ENCOUNTER — Other Ambulatory Visit: Payer: Self-pay | Admitting: Family Medicine

## 2021-12-29 ENCOUNTER — Other Ambulatory Visit: Payer: Self-pay

## 2021-12-29 MED ORDER — MONTELUKAST SODIUM 10 MG PO TABS: ORAL_TABLET | ORAL | 2 refills | Status: DC

## 2021-12-29 MED ORDER — AMLODIPINE BESYLATE 5 MG PO TABS: ORAL_TABLET | ORAL | 2 refills | Status: DC

## 2022-01-02 ENCOUNTER — Other Ambulatory Visit: Payer: Self-pay | Admitting: Family Medicine

## 2022-01-02 MED ORDER — MONTELUKAST SODIUM 10 MG PO TABS: ORAL_TABLET | ORAL | 1 refills | Status: DC

## 2022-01-02 MED ORDER — AMLODIPINE BESYLATE 5 MG PO TABS: ORAL_TABLET | ORAL | 1 refills | Status: DC

## 2022-01-02 NOTE — Telephone Encounter (Signed)
Okay to refill script for 30 or 90 days ? Patient was sent in 3 pills instead of full dose , patient does have an upcoming appointment 01/08/2022

## 2022-01-07 DIAGNOSIS — L821 Other seborrheic keratosis: Secondary | ICD-10-CM | POA: Diagnosis not present

## 2022-01-07 DIAGNOSIS — L820 Inflamed seborrheic keratosis: Secondary | ICD-10-CM | POA: Diagnosis not present

## 2022-01-07 DIAGNOSIS — Z872 Personal history of diseases of the skin and subcutaneous tissue: Secondary | ICD-10-CM | POA: Diagnosis not present

## 2022-01-07 DIAGNOSIS — D225 Melanocytic nevi of trunk: Secondary | ICD-10-CM | POA: Diagnosis not present

## 2022-01-07 DIAGNOSIS — Z86006 Personal history of melanoma in-situ: Secondary | ICD-10-CM | POA: Diagnosis not present

## 2022-01-07 DIAGNOSIS — Z08 Encounter for follow-up examination after completed treatment for malignant neoplasm: Secondary | ICD-10-CM | POA: Diagnosis not present

## 2022-01-07 DIAGNOSIS — L814 Other melanin hyperpigmentation: Secondary | ICD-10-CM | POA: Diagnosis not present

## 2022-01-07 DIAGNOSIS — L570 Actinic keratosis: Secondary | ICD-10-CM | POA: Diagnosis not present

## 2022-01-07 DIAGNOSIS — L538 Other specified erythematous conditions: Secondary | ICD-10-CM | POA: Diagnosis not present

## 2022-01-08 ENCOUNTER — Telehealth: Payer: Self-pay | Admitting: *Deleted

## 2022-01-08 ENCOUNTER — Telehealth (INDEPENDENT_AMBULATORY_CARE_PROVIDER_SITE_OTHER): Payer: Medicare Other | Admitting: Family Medicine

## 2022-01-08 ENCOUNTER — Other Ambulatory Visit (INDEPENDENT_AMBULATORY_CARE_PROVIDER_SITE_OTHER): Payer: Medicare Other

## 2022-01-08 ENCOUNTER — Encounter: Payer: Self-pay | Admitting: Family Medicine

## 2022-01-08 DIAGNOSIS — Z0000 Encounter for general adult medical examination without abnormal findings: Secondary | ICD-10-CM | POA: Diagnosis not present

## 2022-01-08 DIAGNOSIS — E559 Vitamin D deficiency, unspecified: Secondary | ICD-10-CM

## 2022-01-08 DIAGNOSIS — M810 Age-related osteoporosis without current pathological fracture: Secondary | ICD-10-CM | POA: Diagnosis not present

## 2022-01-08 DIAGNOSIS — I10 Essential (primary) hypertension: Secondary | ICD-10-CM

## 2022-01-08 DIAGNOSIS — E782 Mixed hyperlipidemia: Secondary | ICD-10-CM | POA: Diagnosis not present

## 2022-01-08 LAB — COMPREHENSIVE METABOLIC PANEL
ALT: 19 U/L (ref 0–35)
AST: 18 U/L (ref 0–37)
Albumin: 4.1 g/dL (ref 3.5–5.2)
Alkaline Phosphatase: 72 U/L (ref 39–117)
BUN: 18 mg/dL (ref 6–23)
CO2: 29 mEq/L (ref 19–32)
Calcium: 9.3 mg/dL (ref 8.4–10.5)
Chloride: 105 mEq/L (ref 96–112)
Creatinine, Ser: 0.87 mg/dL (ref 0.40–1.20)
GFR: 62.57 mL/min (ref >60.00)
Glucose, Bld: 94 mg/dL (ref 70–99)
Potassium: 3.9 mEq/L (ref 3.5–5.1)
Sodium: 140 mEq/L (ref 135–145)
Total Bilirubin: 0.8 mg/dL (ref 0.2–1.2)
Total Protein: 6.3 g/dL (ref 6.0–8.3)

## 2022-01-08 LAB — LIPID PANEL
Cholesterol: 204 mg/dL — ABNORMAL HIGH (ref 0–200)
HDL: 51.5 mg/dL (ref >39.00)
LDL Cholesterol: 132 mg/dL — ABNORMAL HIGH (ref 0–99)
NonHDL: 152.93
Total CHOL/HDL Ratio: 4
Triglycerides: 105 mg/dL (ref 0.0–149.0)
VLDL: 21 mg/dL (ref 0.0–40.0)

## 2022-01-08 LAB — CBC
HCT: 42.6 % (ref 36.0–46.0)
Hemoglobin: 14.1 g/dL (ref 12.0–15.0)
MCHC: 33.1 g/dL (ref 30.0–36.0)
MCV: 91.5 fl (ref 78.0–100.0)
Platelets: 191 10*3/uL (ref 150.0–400.0)
RBC: 4.65 Mil/uL (ref 3.87–5.11)
RDW: 13.3 % (ref 11.5–15.5)
WBC: 6.8 10*3/uL (ref 4.0–10.5)

## 2022-01-08 LAB — TSH: TSH: 2.3 u[IU]/mL (ref 0.35–5.50)

## 2022-01-08 LAB — VITAMIN D 25 HYDROXY (VIT D DEFICIENCY, FRACTURES): VITD: 106.82 ng/mL (ref 30.00–100.00)

## 2022-01-08 MED ORDER — QVAR REDIHALER 40 MCG/ACT IN AERB: 1.0000 | INHALATION_SPRAY | Freq: Two times a day (BID) | RESPIRATORY_TRACT | 5 refills | Status: DC

## 2022-01-08 MED ORDER — ALBUTEROL SULFATE HFA 108 (90 BASE) MCG/ACT IN AERS: 2.0000 | INHALATION_SPRAY | Freq: Four times a day (QID) | RESPIRATORY_TRACT | 2 refills | Status: DC | PRN

## 2022-01-08 NOTE — Telephone Encounter (Signed)
Called her back- she is taking 10,000 IU of D daily Advised her to stop totally for 2 weeks Then start back with no more than 2,000 IU daily Message to Dr B in case she would like to have pt do a repeat vit D

## 2022-01-08 NOTE — Assessment & Plan Note (Signed)
RSV (respiratory syncitial virus) vaccine at pharmacy, Arexvy Covid booster new version at pharmacy High dose flu shot  Tetanus due, at pharmacy Shingrix is the new shingles shot, 2 shots over 2-6 months, confirm coverage with insurance and document, then can return here for shots with nurse appt or at pharmacy

## 2022-01-08 NOTE — Assessment & Plan Note (Addendum)
Supplement and monitor, she was taking Vitamin D 10000 IU daily and on check her number was elevated. She has been asked to stop all vitamin D for a month and we will recheck levels

## 2022-01-08 NOTE — Assessment & Plan Note (Signed)
Monitor and report any concerns, no changes to meds. Encouraged heart healthy diet such as the DASH diet and exercise as tolerated.  ?

## 2022-01-08 NOTE — Assessment & Plan Note (Signed)
Encourage heart healthy diet such as MIND or DASH diet, increase exercise, avoid trans fats, simple carbohydrates and processed foods, consider a krill or fish or flaxseed oil cap daily.  °

## 2022-01-08 NOTE — Telephone Encounter (Signed)
CRITICAL VALUE STICKER  CRITICAL VALUE: Vit D -- 106.82  RECEIVER (on-site recipient of call): Kelle Darting, [place] NOTIFIED:  01/09/22 @ 4:54pm  MESSENGER (representative from lab): Saa  MD NOTIFIED: Charlett Blake / [place]  TIME OF NOTIFICATION:  RESPONSE:

## 2022-01-08 NOTE — Telephone Encounter (Signed)
Spoke with Dr.Copland she is talking with pt

## 2022-01-08 NOTE — Telephone Encounter (Signed)
Received critical vitamin D as doc of the day.  Called patient on her home and cell phones, left messages but no answer Will reach out via MyChart as well CC to her PCP

## 2022-01-08 NOTE — Assessment & Plan Note (Signed)
Encouraged to get adequate exercise, calcium and vitamin d intake 

## 2022-01-09 ENCOUNTER — Other Ambulatory Visit: Payer: Self-pay | Admitting: *Deleted

## 2022-01-09 DIAGNOSIS — E673 Hypervitaminosis D: Secondary | ICD-10-CM

## 2022-01-09 NOTE — Progress Notes (Signed)
MyChart Video Visit    Virtual Visit via Video Note   This visit type was conducted due to national recommendations for restrictions regarding the COVID-19 Pandemic (e.g. social distancing) in an effort to limit this patient's exposure and mitigate transmission in our community. This patient is at least at moderate risk for complications without adequate follow up. This format is felt to be most appropriate for this patient at this time. Physical exam was limited by quality of the video and audio technology used for the visit. Shamaine, CMA was able to get the patient set up on a video visit.  Patient location: Home Patient and provider in visit Provider location: Office  I discussed the limitations of evaluation and management by telemedicine and the availability of in person appointments. The patient expressed understanding and agreed to proceed.  Visit Date: 01/08/2022  Today's healthcare provider: Penni Homans, MD     Subjective:    Patient ID: Karen Hoffman, female    DOB: 1940-06-24, 81 y.o.   MRN: [id number]  No chief complaint on file.   HPI Patient is in today for follow-up on chronic medical concerns.  Overall she feels well.  No recent febrile illness or hospitalizations.  She is eating well and staying busy.  She does have some trouble intermittently with the left side of her neck but no recent falls or trauma.  No new incontinence or acute concerns. Denies CP/palp/SOB/HA/congestion/fevers/GI or GU c/o. Taking meds as prescribed   Past Medical History:  Diagnosis Date   Allergy    improved since move to [place]   Asthma    mild, intermittent, improved since moving to [place]   Carcinoma in situ of skin of back    Cataracts, bilateral    Cervical cancer screening 06/19/2013   Elevated BP    Elevated LFTs 07/02/2010   Glaucoma    History of chicken pox 07/02/2010   History of measles 07/02/2010   HTN (hypertension) 08/06/2010   Hyperlipidemia 09/04/2010   Hypokalemia  11/18/2011   Malignant melanoma of eye ([place])    left    Medicare annual wellness visit, subsequent 04/21/2011   Osteoporosis    Overweight(278.02) 07/02/2010   Thyroid disease 07/02/2010   URI (upper respiratory infection) 11/17/2011   Vitamin D deficiency 06/27/2015    Past Surgical History:  Procedure Laterality Date   broken ankle  1997   right, screws and plates in place   left eye surgery     TONSILLECTOMY     TRUNK SKIN LESION EXCISIONAL BIOPSY     x twice    Family History  Problem Relation Age of Onset   Cancer Mother        lung/ smoker   Heart attack Paternal Grandmother    Heart disease Father    Hypertension Father    Kidney disease Father 88       dialysis   Obesity Father    Diabetes type II Father    Graves' disease Daughter    Hypothyroidism Daughter        h/o Hashimoto's now hypothyroid   Cholecystitis Son 26       cholecystectomy    Social History   Socioeconomic History   Marital status: Widowed    Spouse name: Not on file   Number of children: Not on file   Years of education: Not on file   Highest education level: Not on file  Occupational History   Not on file  Tobacco Use  Smoking status: Former    Packs/day: 0.20    Years: 10.00    Total pack years: 2.00    Types: Cigarettes    Quit date: 04/07/1975    Years since quitting: 46.7   Smokeless tobacco: Never  Substance and Sexual Activity   Alcohol use: Yes    Comment: 1 glass of wine or coctail with dinner   Drug use: No   Sexual activity: Never    Comment: lives with dog, no major dietary restrictions, wears seat  belt  Other Topics Concern   Not on file  Social History Narrative   Not on file   Social Determinants of Health   Financial Resource Strain: Not on file  Food Insecurity: Not on file  Transportation Needs: Not on file  Physical Activity: Not on file  Stress: Not on file  Social Connections: Not on file  Intimate Partner Violence: Not on file    Outpatient  Medications Prior to Visit  Medication Sig Dispense Refill   amLODipine (NORVASC) 5 MG tablet TAKE 1 TABLET (5 MG TOTAL) BY MOUTH DAILY. *EMERGENCY FILL* 90 tablet 1   Calcium Citrate-Vitamin D (CALCIUM CITRATE + PO) Take 1 tablet by mouth daily.     dorzolamide-timolol (COSOPT) 22.3-6.8 MG/ML ophthalmic solution 1 drop 2 (two) times daily.     Flaxseed, Linseed, (FLAXSEED OIL) 1000 MG CAPS Take 1 capsule by mouth daily.     Loratadine 10 MG CAPS Take by mouth 2 (two) times daily.     milk thistle 175 MG tablet Take 250 mg by mouth daily.     montelukast (SINGULAIR) 10 MG tablet TAKE 1 TABLET BY MOUTH EVERYDAY AT BEDTIME. *EMERGENCY FILL* 90 tablet 1   Multiple Vitamins-Minerals (CENTRUM ADULTS PO) Take 1 tablet by mouth daily.     Probiotic Product (ALIGN PO) Take 1 capsule by mouth daily.     vitamin B-12 (CYANOCOBALAMIN) 1000 MCG tablet Take 1,000 mcg by mouth daily.     vitamin C (ASCORBIC ACID) 500 MG tablet Take 500 mg by mouth daily.     albuterol (VENTOLIN HFA) 108 (90 Base) MCG/ACT inhaler Inhale 2 puffs into the lungs every 6 (six) hours as needed for wheezing or shortness of breath. 8 g 2   fluticasone (FLOVENT HFA) 110 MCG/ACT inhaler TAKE 1-2 PUFFS BY MOUTH TWICE A DAY 36 each 1   No facility-administered medications prior to visit.    Allergies  Allergen Reactions   Brimonidine Tartrate-Timolol Other (See Comments)    Pt. reports eye redness & itchiness.   Chlorthalidone Other (See Comments)    Significant hypokalemia   Losartan     Shortness of breath    Ace Inhibitors Cough    Review of Systems  Constitutional:  Positive for malaise/fatigue. Negative for chills and fever.  HENT:  Negative for congestion and hearing loss.   Eyes:  Negative for blurred vision and discharge.  Respiratory:  Negative for cough, sputum production and shortness of breath.   Cardiovascular:  Negative for chest pain, palpitations and leg swelling.  Gastrointestinal:  Negative for abdominal  pain, blood in stool, constipation, diarrhea, heartburn, nausea and vomiting.  Genitourinary:  Negative for dysuria, frequency, hematuria and urgency.  Musculoskeletal:  Positive for myalgias and neck pain. Negative for back pain and falls.  Skin:  Negative for rash.  Neurological:  Negative for dizziness, sensory change, loss of consciousness, weakness and headaches.  Endo/Heme/Allergies:  Negative for environmental allergies. Does not bruise/bleed easily.  Psychiatric/Behavioral:  Negative  for depression and suicidal ideas. The patient is not nervous/anxious and does not have insomnia.        Objective:    Physical Exam Constitutional:      General: She is not in acute distress.    Appearance: Normal appearance. She is not ill-appearing or toxic-appearing.  HENT:     Head: Normocephalic and atraumatic.     Right Ear: External ear normal.     Left Ear: External ear normal.     Nose: Nose normal.  Eyes:     General:        Right eye: No discharge.        Left eye: No discharge.  Pulmonary:     Effort: Pulmonary effort is normal.  Skin:    Findings: No rash.  Neurological:     Mental Status: She is alert and oriented to person, place, and time.  Psychiatric:        Behavior: Behavior normal.     BP 130/70   Pulse 60  Wt Readings from Last 3 Encounters:  01/07/21 175 lb 3.2 oz (79.5 kg)  07/16/20 172 lb (78 kg)  02/07/18 189 lb (85.7 kg)    Diabetic Foot Exam - Simple   No data filed    Lab Results  Component Value Date   WBC 6.8 01/08/2022   HGB 14.1 01/08/2022   HCT 42.6 01/08/2022   PLT 191.0 01/08/2022   GLUCOSE 94 01/08/2022   CHOL 204 (H) 01/08/2022   TRIG 105.0 01/08/2022   HDL 51.50 01/08/2022   LDLDIRECT 131.6 12/12/2012   LDLCALC 132 (H) 01/08/2022   ALT 19 01/08/2022   AST 18 01/08/2022   NA 140 01/08/2022   K 3.9 01/08/2022   CL 105 01/08/2022   CREATININE 0.87 01/08/2022   BUN 18 01/08/2022   CO2 29 01/08/2022   TSH 2.30 01/08/2022    HGBA1C 5.4 01/09/2021    Lab Results  Component Value Date   TSH 2.30 01/08/2022   Lab Results  Component Value Date   WBC 6.8 01/08/2022   HGB 14.1 01/08/2022   HCT 42.6 01/08/2022   MCV 91.5 01/08/2022   PLT 191.0 01/08/2022   Lab Results  Component Value Date   NA 140 01/08/2022   K 3.9 01/08/2022   CO2 29 01/08/2022   GLUCOSE 94 01/08/2022   BUN 18 01/08/2022   CREATININE 0.87 01/08/2022   BILITOT 0.8 01/08/2022   ALKPHOS 72 01/08/2022   AST 18 01/08/2022   ALT 19 01/08/2022   PROT 6.3 01/08/2022   ALBUMIN 4.1 01/08/2022   CALCIUM 9.3 01/08/2022   GFR 62.57 01/08/2022   Lab Results  Component Value Date   CHOL 204 (H) 01/08/2022   Lab Results  Component Value Date   HDL 51.50 01/08/2022   Lab Results  Component Value Date   LDLCALC 132 (H) 01/08/2022   Lab Results  Component Value Date   TRIG 105.0 01/08/2022   Lab Results  Component Value Date   CHOLHDL 4 01/08/2022   Lab Results  Component Value Date   HGBA1C 5.4 01/09/2021       Assessment & Plan:   Problem List Items Addressed This Visit     Osteoporosis    Encouraged to get adequate exercise, calcium and vitamin d intake      HTN (hypertension)    Monitor and report any concerns, no changes to meds. Encouraged heart healthy diet such as the DASH diet and exercise as tolerated.  Relevant Orders   CBC (Completed)   Comprehensive metabolic panel (Completed)   TSH (Completed)   Hyperlipidemia    Encourage heart healthy diet such as MIND or DASH diet, increase exercise, avoid trans fats, simple carbohydrates and processed foods, consider a krill or fish or flaxseed oil cap daily.       Relevant Orders   Lipid panel (Completed)   Preventative health care    RSV (respiratory syncitial virus) vaccine at pharmacy, Arexvy Covid booster new version at pharmacy High dose flu shot  Tetanus due, at pharmacy Shingrix is the new shingles shot, 2 shots over 2-6 months, confirm coverage  with insurance and document, then can return here for shots with nurse appt or at pharmacy      Vitamin D deficiency    Supplement and monitor, she was taking Vitamin D 10000 IU daily and on check her number was elevated. She has been asked to stop all vitamin D for a month and we will recheck levels      Relevant Orders   Vitamin D (25 hydroxy) (Completed)    I have discontinued Pamala Hurry C. Lagunes's fluticasone. I am also having her start on Qvar RediHaler. Additionally, I am having her maintain her ascorbic acid, Calcium Citrate-Vitamin D (CALCIUM CITRATE + PO), Flaxseed Oil, milk thistle, Multiple Vitamins-Minerals (CENTRUM ADULTS PO), Probiotic Product (ALIGN PO), dorzolamide-timolol, Loratadine, cyanocobalamin, amLODipine, montelukast, and albuterol.  Meds ordered this encounter  Medications   albuterol (VENTOLIN HFA) 108 (90 Base) MCG/ACT inhaler    Sig: Inhale 2 puffs into the lungs every 6 (six) hours as needed for wheezing or shortness of breath.    Dispense:  8 g    Refill:  2   beclomethasone (QVAR REDIHALER) 40 MCG/ACT inhaler    Sig: Inhale 1-2 puffs into the lungs 2 (two) times daily.    Dispense:  1 each    Refill:  5    I discussed the assessment and treatment plan with the patient. The patient was provided an opportunity to ask questions and all were answered. The patient agreed with the plan and demonstrated an understanding of the instructions.   The patient was advised to call back or seek an in-person evaluation if the symptoms worsen or if the condition fails to improve as anticipated.    Penni Homans, MD [facility] [phone] (phone) [phone] (fax)  [place]

## 2022-02-09 ENCOUNTER — Other Ambulatory Visit: Payer: Medicare Other

## 2022-02-11 ENCOUNTER — Other Ambulatory Visit (INDEPENDENT_AMBULATORY_CARE_PROVIDER_SITE_OTHER): Payer: Medicare Other

## 2022-02-11 ENCOUNTER — Telehealth: Payer: Self-pay | Admitting: *Deleted

## 2022-02-11 ENCOUNTER — Telehealth: Payer: Self-pay | Admitting: Family Medicine

## 2022-02-11 ENCOUNTER — Other Ambulatory Visit: Payer: Self-pay

## 2022-02-11 ENCOUNTER — Ambulatory Visit (INDEPENDENT_AMBULATORY_CARE_PROVIDER_SITE_OTHER): Payer: Medicare Other

## 2022-02-11 VITALS — BP 122/70 | HR 56

## 2022-02-11 DIAGNOSIS — E673 Hypervitaminosis D: Secondary | ICD-10-CM | POA: Diagnosis not present

## 2022-02-11 DIAGNOSIS — I10 Essential (primary) hypertension: Secondary | ICD-10-CM

## 2022-02-11 DIAGNOSIS — E782 Mixed hyperlipidemia: Secondary | ICD-10-CM

## 2022-02-11 LAB — VITAMIN D 25 HYDROXY (VIT D DEFICIENCY, FRACTURES): VITD: 105.56 ng/mL (ref 30.00–100.00)

## 2022-02-11 NOTE — Telephone Encounter (Signed)
Have her hold any Vit D/calcium supplementation. Follow up recheck of Vit D in 6 weeks. Ty.

## 2022-02-11 NOTE — Telephone Encounter (Signed)
Dr Nani Ravens please review in PCP absence:  CRITICAL VALUE STICKER  CRITICAL VALUE:  vit D  105.56  RECEIVER (on-site recipient of call): Kelle Darting, [place] NOTIFIED: 02/11/22 @ 3:54pm  MESSENGER (representative from lab): shanequa  MD NOTIFIED: blyth / [place]  TIME OF NOTIFICATION: 3:56pm  RESPONSE:

## 2022-02-11 NOTE — Telephone Encounter (Signed)
Patient has A&B so its not really a "Physical"  But patient would like her labs to be put in for next years visit so she can do them prior to appt   Can we insert? & I will call her to make the appt

## 2022-02-11 NOTE — Telephone Encounter (Signed)
Labs ordered.

## 2022-02-11 NOTE — Progress Notes (Signed)
Pt here for Blood pressure check per Charlett Blake  Pt currently takes: Amlodipine 5 MG once daily in the evening   Pt reports compliance with medication.  BP today @ = 142/80 HR = 59  Pt just had labs done. Will have patient sit for a few. Pt provided blood pressures at home.   124/81 P-58  126/87 P-59  125/78 P-58  129/80 P-53   Recheck after 5 mins  BP recheck--- 122/70 P-56   Pt advised per Dr. Nani Ravens to continue medication and check blood pressures and to follow up with Charlett Blake per [facility] note of 6 months.

## 2022-02-12 ENCOUNTER — Other Ambulatory Visit: Payer: Self-pay

## 2022-02-12 DIAGNOSIS — E559 Vitamin D deficiency, unspecified: Secondary | ICD-10-CM

## 2022-02-12 NOTE — Telephone Encounter (Signed)
Called pt Lvm to call our office back we need recheck labs  In 4-6 weeks and discuss labs/ make lab appt

## 2022-02-18 ENCOUNTER — Encounter: Payer: Self-pay | Admitting: Family Medicine

## 2022-03-16 ENCOUNTER — Other Ambulatory Visit (INDEPENDENT_AMBULATORY_CARE_PROVIDER_SITE_OTHER): Payer: Medicare Other

## 2022-03-16 DIAGNOSIS — E782 Mixed hyperlipidemia: Secondary | ICD-10-CM | POA: Diagnosis not present

## 2022-03-16 DIAGNOSIS — E559 Vitamin D deficiency, unspecified: Secondary | ICD-10-CM | POA: Diagnosis not present

## 2022-03-16 DIAGNOSIS — I10 Essential (primary) hypertension: Secondary | ICD-10-CM | POA: Diagnosis not present

## 2022-03-16 LAB — LIPID PANEL
Cholesterol: 208 mg/dL — ABNORMAL HIGH (ref 0–200)
HDL: 54.4 mg/dL (ref >39.00)
LDL Cholesterol: 133 mg/dL — ABNORMAL HIGH (ref 0–99)
NonHDL: 153.7
Total CHOL/HDL Ratio: 4
Triglycerides: 106 mg/dL (ref 0.0–149.0)
VLDL: 21.2 mg/dL (ref 0.0–40.0)

## 2022-03-16 LAB — CBC WITH DIFFERENTIAL/PLATELET
Basophils Absolute: 0 10*3/uL (ref 0.0–0.1)
Basophils Relative: 0.7 % (ref 0.0–3.0)
Eosinophils Absolute: 0.2 10*3/uL (ref 0.0–0.7)
Eosinophils Relative: 2.1 % (ref 0.0–5.0)
HCT: 41.7 % (ref 36.0–46.0)
Hemoglobin: 14 g/dL (ref 12.0–15.0)
Lymphocytes Relative: 24.4 % (ref 12.0–46.0)
Lymphs Abs: 1.8 10*3/uL (ref 0.7–4.0)
MCHC: 33.6 g/dL (ref 30.0–36.0)
MCV: 90 fl (ref 78.0–100.0)
Monocytes Absolute: 0.6 10*3/uL (ref 0.1–1.0)
Monocytes Relative: 8.8 % (ref 3.0–12.0)
Neutro Abs: 4.6 10*3/uL (ref 1.4–7.7)
Neutrophils Relative %: 64 % (ref 43.0–77.0)
Platelets: 199 10*3/uL (ref 150.0–400.0)
RBC: 4.64 Mil/uL (ref 3.87–5.11)
RDW: 13.6 % (ref 11.5–15.5)
WBC: 7.2 10*3/uL (ref 4.0–10.5)

## 2022-03-16 LAB — VITAMIN D 25 HYDROXY (VIT D DEFICIENCY, FRACTURES): VITD: 71.04 ng/mL (ref 30.00–100.00)

## 2022-03-16 LAB — COMPREHENSIVE METABOLIC PANEL
ALT: 17 U/L (ref 0–35)
AST: 16 U/L (ref 0–37)
Albumin: 4 g/dL (ref 3.5–5.2)
Alkaline Phosphatase: 75 U/L (ref 39–117)
BUN: 20 mg/dL (ref 6–23)
CO2: 31 mEq/L (ref 19–32)
Calcium: 9.1 mg/dL (ref 8.4–10.5)
Chloride: 103 mEq/L (ref 96–112)
Creatinine, Ser: 0.78 mg/dL (ref 0.40–1.20)
GFR: 71.24 mL/min (ref >60.00)
Glucose, Bld: 87 mg/dL (ref 70–99)
Potassium: 4.2 mEq/L (ref 3.5–5.1)
Sodium: 140 mEq/L (ref 135–145)
Total Bilirubin: 0.7 mg/dL (ref 0.2–1.2)
Total Protein: 6.3 g/dL (ref 6.0–8.3)

## 2022-03-16 LAB — TSH: TSH: 2.54 u[IU]/mL (ref 0.35–5.50)

## 2022-05-04 DIAGNOSIS — H401112 Primary open-angle glaucoma, right eye, moderate stage: Secondary | ICD-10-CM | POA: Diagnosis not present

## 2022-05-04 DIAGNOSIS — H401123 Primary open-angle glaucoma, left eye, severe stage: Secondary | ICD-10-CM | POA: Diagnosis not present

## 2022-06-25 ENCOUNTER — Other Ambulatory Visit: Payer: Self-pay | Admitting: Family Medicine

## 2022-11-03 DIAGNOSIS — H401123 Primary open-angle glaucoma, left eye, severe stage: Secondary | ICD-10-CM | POA: Diagnosis not present

## 2022-11-03 DIAGNOSIS — H401112 Primary open-angle glaucoma, right eye, moderate stage: Secondary | ICD-10-CM | POA: Diagnosis not present

## 2022-12-22 DIAGNOSIS — C6932 Malignant neoplasm of left choroid: Secondary | ICD-10-CM | POA: Diagnosis not present

## 2023-01-13 DIAGNOSIS — L821 Other seborrheic keratosis: Secondary | ICD-10-CM | POA: Diagnosis not present

## 2023-01-13 DIAGNOSIS — Z86006 Personal history of melanoma in-situ: Secondary | ICD-10-CM | POA: Diagnosis not present

## 2023-01-13 DIAGNOSIS — L538 Other specified erythematous conditions: Secondary | ICD-10-CM | POA: Diagnosis not present

## 2023-01-13 DIAGNOSIS — Z85820 Personal history of malignant melanoma of skin: Secondary | ICD-10-CM | POA: Diagnosis not present

## 2023-01-13 DIAGNOSIS — D485 Neoplasm of uncertain behavior of skin: Secondary | ICD-10-CM | POA: Diagnosis not present

## 2023-01-13 DIAGNOSIS — L814 Other melanin hyperpigmentation: Secondary | ICD-10-CM | POA: Diagnosis not present

## 2023-01-13 DIAGNOSIS — Z08 Encounter for follow-up examination after completed treatment for malignant neoplasm: Secondary | ICD-10-CM | POA: Diagnosis not present

## 2023-01-13 DIAGNOSIS — Z872 Personal history of diseases of the skin and subcutaneous tissue: Secondary | ICD-10-CM | POA: Diagnosis not present

## 2023-01-13 DIAGNOSIS — Z09 Encounter for follow-up examination after completed treatment for conditions other than malignant neoplasm: Secondary | ICD-10-CM | POA: Diagnosis not present

## 2023-01-13 DIAGNOSIS — D225 Melanocytic nevi of trunk: Secondary | ICD-10-CM | POA: Diagnosis not present

## 2023-01-13 DIAGNOSIS — L570 Actinic keratosis: Secondary | ICD-10-CM | POA: Diagnosis not present

## 2023-01-18 ENCOUNTER — Telehealth: Payer: Self-pay | Admitting: *Deleted

## 2023-01-18 ENCOUNTER — Other Ambulatory Visit (INDEPENDENT_AMBULATORY_CARE_PROVIDER_SITE_OTHER): Payer: Medicare Other

## 2023-01-18 DIAGNOSIS — E782 Mixed hyperlipidemia: Secondary | ICD-10-CM

## 2023-01-18 DIAGNOSIS — I10 Essential (primary) hypertension: Secondary | ICD-10-CM

## 2023-01-18 DIAGNOSIS — E559 Vitamin D deficiency, unspecified: Secondary | ICD-10-CM

## 2023-01-18 LAB — CBC WITH DIFFERENTIAL/PLATELET
Basophils Absolute: 0.1 10*3/uL (ref 0.0–0.1)
Basophils Relative: 1 % (ref 0.0–3.0)
Eosinophils Absolute: 0.2 10*3/uL (ref 0.0–0.7)
Eosinophils Relative: 2.3 % (ref 0.0–5.0)
HCT: 43.7 % (ref 36.0–46.0)
Hemoglobin: 14.3 g/dL (ref 12.0–15.0)
Lymphocytes Relative: 23.5 % (ref 12.0–46.0)
Lymphs Abs: 1.6 10*3/uL (ref 0.7–4.0)
MCHC: 32.7 g/dL (ref 30.0–36.0)
MCV: 91.7 fL (ref 78.0–100.0)
Monocytes Absolute: 0.5 10*3/uL (ref 0.1–1.0)
Monocytes Relative: 7 % (ref 3.0–12.0)
Neutro Abs: 4.5 10*3/uL (ref 1.4–7.7)
Neutrophils Relative %: 66.2 % (ref 43.0–77.0)
Platelets: 194 10*3/uL (ref 150.0–400.0)
RBC: 4.76 Mil/uL (ref 3.87–5.11)
RDW: 13.9 % (ref 11.5–15.5)
WBC: 6.8 10*3/uL (ref 4.0–10.5)

## 2023-01-18 LAB — TSH: TSH: 2.61 u[IU]/mL (ref 0.35–5.50)

## 2023-01-18 LAB — COMPREHENSIVE METABOLIC PANEL
ALT: 15 U/L (ref 0–35)
AST: 15 U/L (ref 0–37)
Albumin: 4 g/dL (ref 3.5–5.2)
Alkaline Phosphatase: 75 U/L (ref 39–117)
BUN: 18 mg/dL (ref 6–23)
CO2: 29 meq/L (ref 19–32)
Calcium: 9.2 mg/dL (ref 8.4–10.5)
Chloride: 104 meq/L (ref 96–112)
Creatinine, Ser: 0.81 mg/dL (ref 0.40–1.20)
GFR: 67.68 mL/min (ref >60.00)
Glucose, Bld: 90 mg/dL (ref 70–99)
Potassium: 3.9 meq/L (ref 3.5–5.1)
Sodium: 139 meq/L (ref 135–145)
Total Bilirubin: 0.6 mg/dL (ref 0.2–1.2)
Total Protein: 6.3 g/dL (ref 6.0–8.3)

## 2023-01-18 LAB — LIPID PANEL
Cholesterol: 197 mg/dL (ref 0–200)
HDL: 49.5 mg/dL (ref >39.00)
LDL Cholesterol: 125 mg/dL — ABNORMAL HIGH (ref 0–99)
NonHDL: 147.36
Total CHOL/HDL Ratio: 4
Triglycerides: 113 mg/dL (ref 0.0–149.0)
VLDL: 22.6 mg/dL (ref 0.0–40.0)

## 2023-01-18 LAB — VITAMIN D 25 HYDROXY (VIT D DEFICIENCY, FRACTURES): VITD: 41.92 ng/mL (ref 30.00–100.00)

## 2023-01-18 NOTE — Telephone Encounter (Signed)
Pt here for labs prior to OV on 10/21 with PCP.  There were no future orders in [facility] for today's visit.  I placed future orders based on last labs taken (CBCD, Comp, lipid, tsh and vit D

## 2023-01-24 NOTE — Assessment & Plan Note (Signed)
Monitor and report any concerns, no changes to meds. Encouraged heart healthy diet such as the DASH diet and exercise as tolerated.  ?

## 2023-01-24 NOTE — Assessment & Plan Note (Signed)
Supplement and monitor, she was taking Vitamin D 10000 IU daily and on check her number was elevated. She has been asked to stop all vitamin D for a month and we will recheck levels

## 2023-01-24 NOTE — Assessment & Plan Note (Signed)
Encourage heart healthy diet such as MIND or DASH diet, increase exercise, avoid trans fats, simple carbohydrates and processed foods, consider a krill or fish or flaxseed oil cap daily.  °

## 2023-01-24 NOTE — Assessment & Plan Note (Signed)
Encouraged to get adequate exercise, calcium and vitamin d intake. Consider repeat Dexa scan

## 2023-01-25 ENCOUNTER — Ambulatory Visit (INDEPENDENT_AMBULATORY_CARE_PROVIDER_SITE_OTHER): Payer: Medicare Other | Admitting: Family Medicine

## 2023-01-25 VITALS — BP 138/82 | HR 63 | Temp 98.2°F | Resp 16 | Ht 65.0 in | Wt 178.0 lb

## 2023-01-25 DIAGNOSIS — M810 Age-related osteoporosis without current pathological fracture: Secondary | ICD-10-CM

## 2023-01-25 DIAGNOSIS — E782 Mixed hyperlipidemia: Secondary | ICD-10-CM

## 2023-01-25 DIAGNOSIS — I10 Essential (primary) hypertension: Secondary | ICD-10-CM

## 2023-01-25 DIAGNOSIS — E559 Vitamin D deficiency, unspecified: Secondary | ICD-10-CM | POA: Diagnosis not present

## 2023-01-25 DIAGNOSIS — Z23 Encounter for immunization: Secondary | ICD-10-CM

## 2023-01-25 MED ORDER — QVAR REDIHALER 40 MCG/ACT IN AERB: 1.0000 | INHALATION_SPRAY | Freq: Two times a day (BID) | RESPIRATORY_TRACT | 5 refills | Status: DC

## 2023-01-25 MED ORDER — AMLODIPINE BESYLATE 5 MG PO TABS: 5.0000 mg | ORAL_TABLET | Freq: Every day | ORAL | 3 refills | Status: DC

## 2023-01-25 MED ORDER — ALBUTEROL SULFATE HFA 108 (90 BASE) MCG/ACT IN AERS: 2.0000 | INHALATION_SPRAY | Freq: Four times a day (QID) | RESPIRATORY_TRACT | 2 refills | Status: DC | PRN

## 2023-01-25 NOTE — Progress Notes (Unsigned)
Subjective:    Patient ID: Karen Hoffman, female    DOB: [birth year], 82 y.o.   MRN: [id number]  No chief complaint on file.   HPI Discussed the use of AI scribe software for clinical note transcription with the patient, who gave verbal consent to proceed.  History of Present Illness   The patient, with a history of hypertension and hyperlipidemia, presents for a routine check-up and prescription renewal. They report discontinuing montelukast and occasionally needing two inhalers, Qvar and Ventolin, for respiratory issues. They describe their lifestyle as active, with almost daily swimming, and are mindful of their carbohydrate intake. Her insurance made her switch to Qvar but she had so much left of her last medicine so she has not tried it yet. she only needs the inhalers sporacitally, no concerns presently        Past Medical History:  Diagnosis Date   Allergy    improved since move to [place]   Asthma    mild, intermittent, improved since moving to [place]   Carcinoma in situ of skin of back    Cataracts, bilateral    Cervical cancer screening 06/19/2013   Elevated BP    Elevated LFTs 07/02/2010   Glaucoma    History of chicken pox 07/02/2010   History of measles 07/02/2010   HTN (hypertension) 08/06/2010   Hyperlipidemia 09/04/2010   Hypokalemia 11/18/2011   Malignant melanoma of eye (HCC)    left    Medicare annual wellness visit, subsequent 04/21/2011   Osteoporosis    Overweight(278.02) 07/02/2010   Thyroid disease 07/02/2010   URI (upper respiratory infection) 11/17/2011   Vitamin D deficiency 06/27/2015    Past Surgical History:  Procedure Laterality Date   broken ankle  1997   right, screws and plates in place   left eye surgery     TONSILLECTOMY     TRUNK SKIN LESION EXCISIONAL BIOPSY     x twice    Family History  Problem Relation Age of Onset   Cancer Mother        lung/ smoker   Heart attack Paternal Grandmother    Heart disease Father    Hypertension Father     Kidney disease Father 58       dialysis   Obesity Father    Diabetes type II Father    Graves' disease Daughter    Hypothyroidism Daughter        h/o Hashimoto's now hypothyroid   Cholecystitis Son 33       cholecystectomy    Social History   Socioeconomic History   Marital status: Widowed    Spouse name: Not on file   Number of children: Not on file   Years of education: Not on file   Highest education level: Not on file  Occupational History   Not on file  Tobacco Use   Smoking status: Former    Current packs/day: 0.00    Average packs/day: 0.2 packs/day for 10.0 years (2.0 ttl pk-yrs)    Types: Cigarettes    Start date: 04/06/1965    Quit date: 04/07/1975    Years since quitting: 47.8   Smokeless tobacco: Never  Substance and Sexual Activity   Alcohol use: Yes    Comment: 1 glass of wine or coctail with dinner   Drug use: No   Sexual activity: Never    Comment: lives with dog, no major dietary restrictions, wears seat  belt  Other Topics Concern   Not on file  Social History Narrative   Not on file   Social Determinants of Health   Financial Resource Strain: Not on file  Food Insecurity: Not on file  Transportation Needs: Not on file  Physical Activity: Not on file  Stress: Not on file  Social Connections: Not on file  Intimate Partner Violence: Not on file    Outpatient Medications Prior to Visit  Medication Sig Dispense Refill   Calcium Citrate-Vitamin D (CALCIUM CITRATE + PO) Take 1 tablet by mouth daily.     dorzolamide-timolol (COSOPT) 22.3-6.8 MG/ML ophthalmic solution 1 drop 2 (two) times daily.     Flaxseed, Linseed, (FLAXSEED OIL) 1000 MG CAPS Take 1 capsule by mouth daily.     latanoprost (XALATAN) 0.005 % ophthalmic solution      Loratadine 10 MG CAPS Take by mouth 2 (two) times daily.     milk thistle 175 MG tablet Take 250 mg by mouth daily.     Multiple Vitamins-Minerals (CENTRUM ADULTS PO) Take 1 tablet by mouth daily.     Probiotic  Product (ALIGN PO) Take 1 capsule by mouth daily.     vitamin B-12 (CYANOCOBALAMIN) 1000 MCG tablet Take 1,000 mcg by mouth daily.     vitamin C (ASCORBIC ACID) 500 MG tablet Take 500 mg by mouth daily.     albuterol (VENTOLIN HFA) 108 (90 Base) MCG/ACT inhaler Inhale 2 puffs into the lungs every 6 (six) hours as needed for wheezing or shortness of breath. 8 g 2   amLODipine (NORVASC) 5 MG tablet Take 1 tablet (5 mg total) by mouth daily. 90 tablet 2   beclomethasone (QVAR REDIHALER) 40 MCG/ACT inhaler Inhale 1-2 puffs into the lungs 2 (two) times daily. 1 each 5   montelukast (SINGULAIR) 10 MG tablet Take 1 tablet (10 mg total) by mouth at bedtime. 90 tablet 2   No facility-administered medications prior to visit.    Allergies  Allergen Reactions   Brimonidine Tartrate-Timolol Other (See Comments)    Pt. reports eye redness & itchiness.   Chlorthalidone Other (See Comments)    Significant hypokalemia   Losartan     Shortness of breath    Ace Inhibitors Cough    Review of Systems  Constitutional:  Negative for chills, fever and malaise/fatigue.  HENT:  Negative for congestion and hearing loss.   Eyes:  Negative for discharge.  Respiratory:  Negative for cough, sputum production and shortness of breath.   Cardiovascular:  Negative for chest pain, palpitations and leg swelling.  Gastrointestinal:  Negative for abdominal pain, blood in stool, constipation, diarrhea, heartburn, nausea and vomiting.  Genitourinary:  Negative for dysuria, frequency, hematuria and urgency.  Musculoskeletal:  Negative for back pain, falls and myalgias.  Skin:  Negative for rash.  Neurological:  Negative for dizziness, sensory change, loss of consciousness, weakness and headaches.  Endo/Heme/Allergies:  Negative for environmental allergies. Does not bruise/bleed easily.  Psychiatric/Behavioral:  Negative for depression and suicidal ideas. The patient is not nervous/anxious and does not have insomnia.         Objective:    Physical Exam Constitutional:      General: She is not in acute distress.    Appearance: Normal appearance. She is not diaphoretic.  HENT:     Head: Normocephalic and atraumatic.     Right Ear: Tympanic membrane, ear canal and external ear normal.     Left Ear: Tympanic membrane, ear canal and external ear normal.     Nose: Nose normal.  Mouth/Throat:     Mouth: Mucous membranes are moist.     Pharynx: Oropharynx is clear. No oropharyngeal exudate.  Eyes:     General: No scleral icterus.       Right eye: No discharge.        Left eye: No discharge.     Conjunctiva/sclera: Conjunctivae normal.     Pupils: Pupils are equal, round, and reactive to light.  Neck:     Thyroid: No thyromegaly.  Cardiovascular:     Rate and Rhythm: Normal rate and regular rhythm.     Heart sounds: Normal heart sounds. No murmur heard. Pulmonary:     Effort: Pulmonary effort is normal. No respiratory distress.     Breath sounds: Normal breath sounds. No wheezing or rales.  Abdominal:     General: Bowel sounds are normal. There is no distension.     Palpations: Abdomen is soft. There is no mass.     Tenderness: There is no abdominal tenderness.  Musculoskeletal:        General: No tenderness. Normal range of motion.     Cervical back: Normal range of motion and neck supple.  Lymphadenopathy:     Cervical: No cervical adenopathy.  Skin:    General: Skin is warm and dry.     Findings: No rash.  Neurological:     General: No focal deficit present.     Mental Status: She is alert and oriented to person, place, and time.     Cranial Nerves: No cranial nerve deficit.     Coordination: Coordination normal.     Deep Tendon Reflexes: Reflexes are normal and symmetric. Reflexes normal.  Psychiatric:        Mood and Affect: Mood normal.        Behavior: Behavior normal.        Thought Content: Thought content normal.        Judgment: Judgment normal.     BP 138/82 (BP  Location: Left Arm, Patient Position: Sitting, Cuff Size: Large)   Pulse 63   Temp 98.2 F (36.8 C) (Oral)   Resp 16   Ht 5\' 5"  (1.651 m)   Wt 178 lb (80.7 kg)   SpO2 94%   BMI 29.62 kg/m  Wt Readings from Last 3 Encounters:  01/25/23 178 lb (80.7 kg)  01/07/21 175 lb 3.2 oz (79.5 kg)  07/16/20 172 lb (78 kg)    Diabetic Foot Exam - Simple   No data filed    Lab Results  Component Value Date   WBC 6.8 01/18/2023   HGB 14.3 01/18/2023   HCT 43.7 01/18/2023   PLT 194.0 01/18/2023   GLUCOSE 90 01/18/2023   CHOL 197 01/18/2023   TRIG 113.0 01/18/2023   HDL 49.50 01/18/2023   LDLDIRECT 131.6 12/12/2012   LDLCALC 125 (H) 01/18/2023   ALT 15 01/18/2023   AST 15 01/18/2023   NA 139 01/18/2023   K 3.9 01/18/2023   CL 104 01/18/2023   CREATININE 0.81 01/18/2023   BUN 18 01/18/2023   CO2 29 01/18/2023   TSH 2.61 01/18/2023   HGBA1C 5.4 01/09/2021    Lab Results  Component Value Date   TSH 2.61 01/18/2023   Lab Results  Component Value Date   WBC 6.8 01/18/2023   HGB 14.3 01/18/2023   HCT 43.7 01/18/2023   MCV 91.7 01/18/2023   PLT 194.0 01/18/2023   Lab Results  Component Value Date   NA 139 01/18/2023   K  3.9 01/18/2023   CO2 29 01/18/2023   GLUCOSE 90 01/18/2023   BUN 18 01/18/2023   CREATININE 0.81 01/18/2023   BILITOT 0.6 01/18/2023   ALKPHOS 75 01/18/2023   AST 15 01/18/2023   ALT 15 01/18/2023   PROT 6.3 01/18/2023   ALBUMIN 4.0 01/18/2023   CALCIUM 9.2 01/18/2023   GFR 67.68 01/18/2023   Lab Results  Component Value Date   CHOL 197 01/18/2023   Lab Results  Component Value Date   HDL 49.50 01/18/2023   Lab Results  Component Value Date   LDLCALC 125 (H) 01/18/2023   Lab Results  Component Value Date   TRIG 113.0 01/18/2023   Lab Results  Component Value Date   CHOLHDL 4 01/18/2023   Lab Results  Component Value Date   HGBA1C 5.4 01/09/2021       Assessment & Plan:  Hypertension, unspecified type Assessment &  Plan: Monitor and report any concerns, no changes to meds. Encouraged heart healthy diet such as the DASH diet and exercise as tolerated.   Orders: -     CBC with Differential/Platelet; Future -     Comprehensive metabolic panel; Future -     TSH; Future  Mixed hyperlipidemia Assessment & Plan: Encourage heart healthy diet such as MIND or DASH diet, increase exercise, avoid trans fats, simple carbohydrates and processed foods, consider a krill or fish or flaxseed oil cap daily.   Orders: -     Lipid panel; Future  Osteoporosis, unspecified osteoporosis type, unspecified pathological fracture presence Assessment & Plan: Encouraged to get adequate exercise, calcium and vitamin d intake. Consider repeat Dexa scan   Vitamin D deficiency Assessment & Plan: Supplement and monitor, she was taking Vitamin D 16109 IU daily and on check her number was elevated. She has been asked to stop all vitamin D for a month and we will recheck levels  Orders: -     VITAMIN D 25 Hydroxy (Vit-D Deficiency, Fractures); Future  Need for shingles vaccine -     Varicella-zoster vaccine IM  Other orders -     Albuterol Sulfate HFA; Inhale 2 puffs into the lungs every 6 (six) hours as needed for wheezing or shortness of breath.  Dispense: 8 g; Refill: 2 -     Qvar RediHaler; Inhale 1-2 puffs into the lungs 2 (two) times daily.  Dispense: 1 each; Refill: 5 -     amLODIPine Besylate; Take 1 tablet (5 mg total) by mouth daily.  Dispense: 90 tablet; Refill: 3    Assessment and Plan    Asthma Patient self-discontinued montelukast and is managing symptoms with Qvar and Ventolin as needed. Discussed potential insurance issues with Qvar and the possibility of needing to switch to a different inhaler. -Renew prescriptions for Qvar and Ventolin. -Consider reinitiating montelukast if asthma symptoms worsen.  Hypertension Patient is taking amlodipine daily. -Continue amlodipine. -Renew prescription for  amlodipine.  General Health Maintenance Discussed the importance of regular exercise, hydration, and a balanced diet. Patient declined flu shot, COVID vaccine, shingles vaccine, and respiratory syncytial virus vaccine. Tetanus vaccine overdue. -Encourage patient to maintain regular exercise, hydration, and balanced diet. -Consider tetanus vaccine if patient sustains an injury. -Schedule annual wellness visit and labs for next year.         Danise Edge, MD

## 2023-01-25 NOTE — Patient Instructions (Addendum)
Recommend calcium intake of 1200 to 1500 mg daily, divided into roughly 3 doses. Best source is the diet and a single dairy serving is about 500 mg, a supplement of calcium citrate once or twice daily to balance diet is fine if not getting enough in diet. Also need Vitamin D 2000 IU caps, 1 cap daily if not already taking vitamin D. Also recommend weight baring exercise on hips and upper body to keep bones strong   4000, 8000 steps Netflix The Blue Zones Hydrate 10 ounces every 1-2 hours, 60-80 in 24 hours  Preventive Care 65 Years and Older, Female Preventive care refers to lifestyle choices and visits with your health care provider that can promote health and wellness. Preventive care visits are also called wellness exams. What can I expect for my preventive care visit? Counseling Your health care provider may ask you questions about your: Medical history, including: Past medical problems. Family medical history. Pregnancy and menstrual history. History of falls. Current health, including: Memory and ability to understand (cognition). Emotional well-being. Home life and relationship well-being. Sexual activity and sexual health. Lifestyle, including: Alcohol, nicotine or tobacco, and drug use. Access to firearms. Diet, exercise, and sleep habits. Work and work Astronomer. Sunscreen use. Safety issues such as seatbelt and bike helmet use. Physical exam Your health care provider will check your: Height and weight. These may be used to calculate your BMI (body mass index). BMI is a measurement that tells if you are at a healthy weight. Waist circumference. This measures the distance around your waistline. This measurement also tells if you are at a healthy weight and may help predict your risk of certain diseases, such as type 2 diabetes and high blood pressure. Heart rate and blood pressure. Body temperature. Skin for abnormal spots. What immunizations do I need?  Vaccines are  usually given at various ages, according to a schedule. Your health care provider will recommend vaccines for you based on your age, medical history, and lifestyle or other factors, such as travel or where you work. What tests do I need? Screening Your health care provider may recommend screening tests for certain conditions. This may include: Lipid and cholesterol levels. Hepatitis C test. Hepatitis B test. HIV (human immunodeficiency virus) test. STI (sexually transmitted infection) testing, if you are at risk. Lung cancer screening. Colorectal cancer screening. Diabetes screening. This is done by checking your blood sugar (glucose) after you have not eaten for a while (fasting). Mammogram. Talk with your health care provider about how often you should have regular mammograms. BRCA-related cancer screening. This may be done if you have a family history of breast, ovarian, tubal, or peritoneal cancers. Bone density scan. This is done to screen for osteoporosis. Talk with your health care provider about your test results, treatment options, and if necessary, the need for more tests. Follow these instructions at home: Eating and drinking  Eat a diet that includes fresh fruits and vegetables, whole grains, lean protein, and low-fat dairy products. Limit your intake of foods with high amounts of sugar, saturated fats, and salt. Take vitamin and mineral supplements as recommended by your health care provider. Do not drink alcohol if your health care provider tells you not to drink. If you drink alcohol: Limit how much you have to 0-1 drink a day. Know how much alcohol is in your drink. In the [place] one drink equals one 12 oz bottle of beer (355 mL), one 5 oz glass of wine (148 mL), or one 1  oz glass of hard liquor (44 mL). Lifestyle Brush your teeth every morning and night with fluoride toothpaste. Floss one time each day. Exercise for at least 30 minutes 5 or more days each week. Do not use  any products that contain nicotine or tobacco. These products include cigarettes, chewing tobacco, and vaping devices, such as e-cigarettes. If you need help quitting, ask your health care provider. Do not use drugs. If you are sexually active, practice safe sex. Use a condom or other form of protection in order to prevent STIs. Take aspirin only as told by your health care provider. Make sure that you understand how much to take and what form to take. Work with your health care provider to find out whether it is safe and beneficial for you to take aspirin daily. Ask your health care provider if you need to take a cholesterol-lowering medicine (statin). Find healthy ways to manage stress, such as: Meditation, yoga, or listening to music. Journaling. Talking to a trusted person. Spending time with friends and family. Minimize exposure to UV radiation to reduce your risk of skin cancer. Safety Always wear your seat belt while driving or riding in a vehicle. Do not drive: If you have been drinking alcohol. Do not ride with someone who has been drinking. When you are tired or distracted. While texting. If you have been using any mind-altering substances or drugs. Wear a helmet and other protective equipment during sports activities. If you have firearms in your house, make sure you follow all gun safety procedures. What's next? Visit your health care provider once a year for an annual wellness visit. Ask your health care provider how often you should have your eyes and teeth checked. Stay up to date on all vaccines. This information is not intended to replace advice given to you by your health care provider. Make sure you discuss any questions you have with your health care provider. Document Revised: 09/18/2020 Document Reviewed: 09/18/2020 Elsevier Patient Education  2024 [employer].

## 2023-04-12 DIAGNOSIS — H401112 Primary open-angle glaucoma, right eye, moderate stage: Secondary | ICD-10-CM | POA: Diagnosis not present

## 2023-07-06 DIAGNOSIS — H401124 Primary open-angle glaucoma, left eye, indeterminate stage: Secondary | ICD-10-CM | POA: Diagnosis not present

## 2023-07-06 DIAGNOSIS — C6932 Malignant neoplasm of left choroid: Secondary | ICD-10-CM | POA: Diagnosis not present

## 2023-07-06 DIAGNOSIS — H04123 Dry eye syndrome of bilateral lacrimal glands: Secondary | ICD-10-CM | POA: Diagnosis not present

## 2023-07-06 DIAGNOSIS — H401112 Primary open-angle glaucoma, right eye, moderate stage: Secondary | ICD-10-CM | POA: Diagnosis not present

## 2023-07-06 DIAGNOSIS — H47292 Other optic atrophy, left eye: Secondary | ICD-10-CM | POA: Diagnosis not present

## 2023-07-06 DIAGNOSIS — H35371 Puckering of macula, right eye: Secondary | ICD-10-CM | POA: Diagnosis not present

## 2023-10-11 DIAGNOSIS — H401123 Primary open-angle glaucoma, left eye, severe stage: Secondary | ICD-10-CM | POA: Diagnosis not present

## 2023-10-11 DIAGNOSIS — H401112 Primary open-angle glaucoma, right eye, moderate stage: Secondary | ICD-10-CM | POA: Diagnosis not present

## 2023-10-20 ENCOUNTER — Telehealth: Payer: Self-pay | Admitting: Family Medicine

## 2023-10-20 NOTE — Telephone Encounter (Signed)
 Copied from CRM [id number]. Topic: Medicare AWV >> Oct 20, 2023  1:13 PM Nathanel DEL wrote: Reason for CRM: Called LVM 10/20/2023 to schedule AWV. Please schedule Virtual or Telehealth visits ONLY.   Nathanel Paschal; Care Guide Ambulatory Clinical Support [place] [facility] Direct Dial: [phone]

## 2023-12-22 DIAGNOSIS — C6932 Malignant neoplasm of left choroid: Secondary | ICD-10-CM | POA: Diagnosis not present

## 2024-01-20 ENCOUNTER — Other Ambulatory Visit (INDEPENDENT_AMBULATORY_CARE_PROVIDER_SITE_OTHER): Payer: Medicare Other

## 2024-01-20 DIAGNOSIS — I10 Essential (primary) hypertension: Secondary | ICD-10-CM

## 2024-01-20 DIAGNOSIS — E782 Mixed hyperlipidemia: Secondary | ICD-10-CM | POA: Diagnosis not present

## 2024-01-20 DIAGNOSIS — E559 Vitamin D deficiency, unspecified: Secondary | ICD-10-CM | POA: Diagnosis not present

## 2024-01-20 LAB — COMPREHENSIVE METABOLIC PANEL WITH GFR
ALT: 15 U/L (ref 0–35)
AST: 16 U/L (ref 0–37)
Albumin: 4.2 g/dL (ref 3.5–5.2)
Alkaline Phosphatase: 68 U/L (ref 39–117)
BUN: 18 mg/dL (ref 6–23)
CO2: 31 meq/L (ref 19–32)
Calcium: 9.1 mg/dL (ref 8.4–10.5)
Chloride: 105 meq/L (ref 96–112)
Creatinine, Ser: 0.76 mg/dL (ref 0.40–1.20)
GFR: 72.55 mL/min (ref >60.00)
Glucose, Bld: 94 mg/dL (ref 70–99)
Potassium: 4.7 meq/L (ref 3.5–5.1)
Sodium: 142 meq/L (ref 135–145)
Total Bilirubin: 0.8 mg/dL (ref 0.2–1.2)
Total Protein: 6.2 g/dL (ref 6.0–8.3)

## 2024-01-20 LAB — LIPID PANEL
Cholesterol: 209 mg/dL — ABNORMAL HIGH (ref 0–200)
HDL: 52.9 mg/dL (ref >39.00)
LDL Cholesterol: 132 mg/dL — ABNORMAL HIGH (ref 0–99)
NonHDL: 156.41
Total CHOL/HDL Ratio: 4
Triglycerides: 120 mg/dL (ref 0.0–149.0)
VLDL: 24 mg/dL (ref 0.0–40.0)

## 2024-01-20 LAB — CBC WITH DIFFERENTIAL/PLATELET
Basophils Absolute: 0 K/uL (ref 0.0–0.1)
Basophils Relative: 0.8 % (ref 0.0–3.0)
Eosinophils Absolute: 0.1 K/uL (ref 0.0–0.7)
Eosinophils Relative: 2.1 % (ref 0.0–5.0)
HCT: 41.8 % (ref 36.0–46.0)
Hemoglobin: 13.9 g/dL (ref 12.0–15.0)
Lymphocytes Relative: 23.6 % (ref 12.0–46.0)
Lymphs Abs: 1.4 K/uL (ref 0.7–4.0)
MCHC: 33.3 g/dL (ref 30.0–36.0)
MCV: 90.4 fl (ref 78.0–100.0)
Monocytes Absolute: 0.4 K/uL (ref 0.1–1.0)
Monocytes Relative: 7.5 % (ref 3.0–12.0)
Neutro Abs: 3.8 K/uL (ref 1.4–7.7)
Neutrophils Relative %: 66 % (ref 43.0–77.0)
Platelets: 174 K/uL (ref 150.0–400.0)
RBC: 4.62 Mil/uL (ref 3.87–5.11)
RDW: 13.8 % (ref 11.5–15.5)
WBC: 5.8 K/uL (ref 4.0–10.5)

## 2024-01-20 LAB — VITAMIN D 25 HYDROXY (VIT D DEFICIENCY, FRACTURES): VITD: 39.12 ng/mL (ref 30.00–100.00)

## 2024-01-20 LAB — TSH: TSH: 2.38 u[IU]/mL (ref 0.35–5.50)

## 2024-01-21 ENCOUNTER — Ambulatory Visit: Payer: Self-pay | Admitting: Family Medicine

## 2024-01-23 NOTE — Assessment & Plan Note (Addendum)
 RSV (respiratory syncitial virus) vaccine at pharmacy, Arexvy Covid booster new version at pharmacy High dose flu shot  Tetanus due, at pharmacy Shingrix  is the new shingles shot, 2 shots over 2-6 months, confirm coverage with insurance and document, then can return here for shots with nurse appt or at pharmacy Declines colonoscopy, pap and MGM and has aged out

## 2024-01-23 NOTE — Assessment & Plan Note (Signed)
 No recent exacerbation

## 2024-01-23 NOTE — Assessment & Plan Note (Signed)
Encouraged to get adequate exercise, calcium and vitamin d intake. Consider repeat Dexa scan

## 2024-01-23 NOTE — Assessment & Plan Note (Signed)
 Encourage heart healthy diet such as MIND or DASH diet, increase exercise, avoid trans fats, simple carbohydrates and processed foods, consider a krill or fish or flaxseed oil cap daily.

## 2024-01-23 NOTE — Assessment & Plan Note (Signed)
Supplement and monitor, she was taking Vitamin D 10000 IU daily and on check her number was elevated. She has been asked to stop all vitamin D for a month and we will recheck levels

## 2024-01-23 NOTE — Assessment & Plan Note (Signed)
 Monitor and report any concerns, no changes to meds. Encouraged heart healthy diet such as the DASH diet and exercise as tolerated.  ?

## 2024-01-26 ENCOUNTER — Ambulatory Visit

## 2024-01-27 ENCOUNTER — Encounter: Payer: Self-pay | Admitting: Family Medicine

## 2024-01-27 ENCOUNTER — Ambulatory Visit: Payer: Medicare Other | Admitting: Family Medicine

## 2024-01-27 VITALS — BP 122/78 | HR 57 | Temp 98.2°F | Resp 12 | Ht 65.0 in | Wt 171.8 lb

## 2024-01-27 DIAGNOSIS — E782 Mixed hyperlipidemia: Secondary | ICD-10-CM

## 2024-01-27 DIAGNOSIS — Z85840 Personal history of malignant neoplasm of eye: Secondary | ICD-10-CM

## 2024-01-27 DIAGNOSIS — I10 Essential (primary) hypertension: Secondary | ICD-10-CM

## 2024-01-27 DIAGNOSIS — Z0000 Encounter for general adult medical examination without abnormal findings: Secondary | ICD-10-CM

## 2024-01-27 DIAGNOSIS — J45909 Unspecified asthma, uncomplicated: Secondary | ICD-10-CM | POA: Diagnosis not present

## 2024-01-27 DIAGNOSIS — M810 Age-related osteoporosis without current pathological fracture: Secondary | ICD-10-CM | POA: Diagnosis not present

## 2024-01-27 DIAGNOSIS — E559 Vitamin D deficiency, unspecified: Secondary | ICD-10-CM | POA: Diagnosis not present

## 2024-01-27 DIAGNOSIS — Z85820 Personal history of malignant melanoma of skin: Secondary | ICD-10-CM

## 2024-01-27 MED ORDER — QVAR REDIHALER 40 MCG/ACT IN AERB: 1.0000 | INHALATION_SPRAY | Freq: Two times a day (BID) | RESPIRATORY_TRACT | 5 refills | Status: AC

## 2024-01-27 MED ORDER — AMLODIPINE BESYLATE 5 MG PO TABS: 5.0000 mg | ORAL_TABLET | Freq: Every day | ORAL | 3 refills | Status: AC

## 2024-01-27 MED ORDER — ALBUTEROL SULFATE HFA 108 (90 BASE) MCG/ACT IN AERS: 2.0000 | INHALATION_SPRAY | Freq: Four times a day (QID) | RESPIRATORY_TRACT | 5 refills | Status: AC | PRN

## 2024-01-27 NOTE — Patient Instructions (Addendum)
 Annual flu and covid boosters Shingrix  is the new shingles shot, 2 shots over 2-6 months, confirm coverage with insurance and document, then can return here for shots with nurse appt or at pharmacy  RSV, Respiratory Syncitial Virus vaccine, Arexvy Prevnar 20     Hypertension, Adult High blood pressure (hypertension) is when the force of blood pumping through the arteries is too strong. The arteries are the blood vessels that carry blood from the heart throughout the body. Hypertension forces the heart to work harder to pump blood and may cause arteries to become narrow or stiff. Untreated or uncontrolled hypertension can lead to a heart attack, heart failure, a stroke, kidney disease, and other problems. A blood pressure reading consists of a higher number over a lower number. Ideally, your blood pressure should be below 120/80. The first (top) number is called the systolic pressure. It is a measure of the pressure in your arteries as your heart beats. The second (bottom) number is called the diastolic pressure. It is a measure of the pressure in your arteries as the heart relaxes. What are the causes? The exact cause of this condition is not known. There are some conditions that result in high blood pressure. What increases the risk? Certain factors may make you more likely to develop high blood pressure. Some of these risk factors are under your control, including: Smoking. Not getting enough exercise or physical activity. Being overweight. Having too much fat, sugar, calories, or salt (sodium) in your diet. Drinking too much alcohol. Other risk factors include: Having a personal history of heart disease, diabetes, high cholesterol, or kidney disease. Stress. Having a family history of high blood pressure and high cholesterol. Having obstructive sleep apnea. Age. The risk increases with age. What are the signs or symptoms? High blood pressure may not cause symptoms. Very high blood  pressure (hypertensive crisis) may cause: Headache. Fast or irregular heartbeats (palpitations). Shortness of breath. Nosebleed. Nausea and vomiting. Vision changes. Severe chest pain, dizziness, and seizures. How is this diagnosed? This condition is diagnosed by measuring your blood pressure while you are seated, with your arm resting on a flat surface, your legs uncrossed, and your feet flat on the floor. The cuff of the blood pressure monitor will be placed directly against the skin of your upper arm at the level of your heart. Blood pressure should be measured at least twice using the same arm. Certain conditions can cause a difference in blood pressure between your right and left arms. If you have a high blood pressure reading during one visit or you have normal blood pressure with other risk factors, you may be asked to: Return on a different day to have your blood pressure checked again. Monitor your blood pressure at home for 1 week or longer. If you are diagnosed with hypertension, you may have other blood or imaging tests to help your health care provider understand your overall risk for other conditions. How is this treated? This condition is treated by making healthy lifestyle changes, such as eating healthy foods, exercising more, and reducing your alcohol intake. You may be referred for counseling on a healthy diet and physical activity. Your health care provider may prescribe medicine if lifestyle changes are not enough to get your blood pressure under control and if: Your systolic blood pressure is above 130. Your diastolic blood pressure is above 80. Your personal target blood pressure may vary depending on your medical conditions, your age, and other factors. Follow these instructions at  home: Eating and drinking  Eat a diet that is high in fiber and potassium, and low in sodium, added sugar, and fat. An example of this eating plan is called the DASH diet. DASH stands for  Dietary Approaches to Stop Hypertension. To eat this way: Eat plenty of fresh fruits and vegetables. Try to fill one half of your plate at each meal with fruits and vegetables. Eat whole grains, such as whole-wheat pasta, brown rice, or whole-grain bread. Fill about one fourth of your plate with whole grains. Eat or drink low-fat dairy products, such as skim milk or low-fat yogurt. Avoid fatty cuts of meat, processed or cured meats, and poultry with skin. Fill about one fourth of your plate with lean proteins, such as fish, chicken without skin, beans, eggs, or tofu. Avoid pre-made and processed foods. These tend to be higher in sodium, added sugar, and fat. Reduce your daily sodium intake. Many people with hypertension should eat less than 1,500 mg of sodium a day. Do not drink alcohol if: Your health care provider tells you not to drink. You are pregnant, may be pregnant, or are planning to become pregnant. If you drink alcohol: Limit how much you have to: 0-1 drink a day for women. 0-2 drinks a day for men. Know how much alcohol is in your drink. In the [place] one drink equals one 12 oz bottle of beer (355 mL), one 5 oz glass of wine (148 mL), or one 1 oz glass of hard liquor (44 mL). Lifestyle  Work with your health care provider to maintain a healthy body weight or to lose weight. Ask what an ideal weight is for you. Get at least 30 minutes of exercise that causes your heart to beat faster (aerobic exercise) most days of the week. Activities may include walking, swimming, or biking. Include exercise to strengthen your muscles (resistance exercise), such as Pilates or lifting weights, as part of your weekly exercise routine. Try to do these types of exercises for 30 minutes at least 3 days a week. Do not use any products that contain nicotine or tobacco. These products include cigarettes, chewing tobacco, and vaping devices, such as e-cigarettes. If you need help quitting, ask your health  care provider. Monitor your blood pressure at home as told by your health care provider. Keep all follow-up visits. This is important. Medicines Take over-the-counter and prescription medicines only as told by your health care provider. Follow directions carefully. Blood pressure medicines must be taken as prescribed. Do not skip doses of blood pressure medicine. Doing this puts you at risk for problems and can make the medicine less effective. Ask your health care provider about side effects or reactions to medicines that you should watch for. Contact a health care provider if you: Think you are having a reaction to a medicine you are taking. Have headaches that keep coming back (recurring). Feel dizzy. Have swelling in your ankles. Have trouble with your vision. Get help right away if you: Develop a severe headache or confusion. Have unusual weakness or numbness. Feel faint. Have severe pain in your chest or abdomen. Vomit repeatedly. Have trouble breathing. These symptoms may be an emergency. Get help right away. Call 911. Do not wait to see if the symptoms will go away. Do not drive yourself to the hospital. Summary Hypertension is when the force of blood pumping through your arteries is too strong. If this condition is not controlled, it may put you at risk for serious complications. Your  personal target blood pressure may vary depending on your medical conditions, your age, and other factors. For most people, a normal blood pressure is less than 120/80. Hypertension is treated with lifestyle changes, medicines, or a combination of both. Lifestyle changes include losing weight, eating a healthy, low-sodium diet, exercising more, and limiting alcohol. This information is not intended to replace advice given to you by your health care provider. Make sure you discuss any questions you have with your health care provider. Document Revised: 01/28/2021 Document Reviewed:  01/28/2021 Elsevier Patient Education  2024 [employer].

## 2024-01-30 ENCOUNTER — Encounter: Payer: Self-pay | Admitting: Family Medicine

## 2024-01-30 NOTE — Progress Notes (Signed)
 Subjective:    Patient ID: Karen Hoffman, female    DOB: June 15, 1940, 83 y.o.   MRN: [id number]  Chief Complaint  Patient presents with   Follow-up    HPI Discussed the use of AI scribe software for clinical note transcription with the patient, who gave verbal consent to proceed.  History of Present Illness Karen Hoffman is an 83 year old female who presents for medication refills and routine follow-up.  She requests refills for amlodipine  and two inhalers. Her breathing is stable, and she does not frequently use the inhalers but prefers to have them available for flare-ups.  She leads a quiet life and stays active by swimming almost every day. 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  She has a history of eye issues and previously saw a specialist in [place] who has since retired. She now receives care at [facility], which is more convenient for her.  She underwent a bone density test years ago and took Fosamax for a year or two but stopped due to concerns about side effects. She is aware of the risks of osteoporosis and mentions a friend who experienced denture syndrome.  She has received the Prevnar 13 and Pneumovax vaccines in her seventies. She is considering the new Prevnar 20 vaccine and the RSV vaccine.  She acknowledges not drinking enough water and regularly consumes almonds for fiber and protein. No recent ER visits, no new eye issues, bowels are moving comfortably, and no burning with urination.    Past Medical History:  Diagnosis Date   Allergy    improved since move to [place]   Asthma    mild, intermittent, improved since moving to [place]   Carcinoma in situ of skin of back    Cataracts, bilateral    Cervical cancer screening 06/19/2013   Elevated BP    Elevated LFTs 07/02/2010   Glaucoma    History of chicken pox 07/02/2010   History of measles 07/02/2010   HTN (hypertension) 08/06/2010   Hyperlipidemia 09/04/2010    Hypokalemia 11/18/2011   Malignant melanoma of eye (HCC)    left    Medicare annual wellness visit, subsequent 04/21/2011   Osteoporosis    Overweight(278.02) 07/02/2010   Thyroid  disease 07/02/2010   URI (upper respiratory infection) 11/17/2011   Vitamin D  deficiency 06/27/2015    Past Surgical History:  Procedure Laterality Date   broken ankle  1997   right, screws and plates in place   left eye surgery     TONSILLECTOMY     TRUNK SKIN LESION EXCISIONAL BIOPSY     x twice    Family History  Problem Relation Age of Onset   Cancer Mother        lung/ smoker   Heart attack Paternal Grandmother    Heart disease Father    Hypertension Father    Kidney disease Father 27       dialysis   Obesity Father    Diabetes type II Father    Graves' disease Daughter    Hypothyroidism Daughter        h/o Hashimoto's now hypothyroid   Cholecystitis Son 75       cholecystectomy    Social History   Socioeconomic History   Marital status: Widowed    Spouse name: Not on file   Number of children: Not on file   Years of education: Not on file   Highest education level: Not on  file  Occupational History   Not on file  Tobacco Use   Smoking status: Former    Current packs/day: 0.00    Average packs/day: 0.2 packs/day for 10.0 years (2.0 ttl pk-yrs)    Types: Cigarettes    Start date: 04/06/1965    Quit date: 04/07/1975    Years since quitting: 48.8   Smokeless tobacco: Never  Substance and Sexual Activity   Alcohol use: Not Currently    Comment: 1 glass of wine or coctail with dinner   Drug use: No   Sexual activity: Not Currently    Birth control/protection: None    Comment: lives with dog, no major dietary restrictions, wears seat  belt  Other Topics Concern   Not on file  Social History Narrative   Not on file   Social Drivers of Health   Financial Resource Strain: Not on file  Food Insecurity: Not on file  Transportation Needs: Not on file  Physical Activity: Not on file   Stress: Not on file  Social Connections: Not on file  Intimate Partner Violence: Not on file    Outpatient Medications Prior to Visit  Medication Sig Dispense Refill   Boswellia-Glucosamine-Vit D (OSTEO BI-FLEX ONE PER DAY) TABS Take 1 tablet by mouth daily.     Calcium Citrate-Vitamin D  (CALCIUM CITRATE + PO) Take 1 tablet by mouth daily.     dorzolamide -timolol (COSOPT) 22.3-6.8 MG/ML ophthalmic solution 1 drop 2 (two) times daily.     Flaxseed, Linseed, (FLAXSEED OIL) 1000 MG CAPS Take 1 capsule by mouth daily.     latanoprost (XALATAN) 0.005 % ophthalmic solution      Loratadine 10 MG CAPS Take by mouth 2 (two) times daily.     milk thistle 175 MG tablet Take 250 mg by mouth daily.     Multiple Vitamins-Minerals (CENTRUM ADULTS PO) Take 1 tablet by mouth daily.     Probiotic Product (ALIGN PO) Take 1 capsule by mouth daily.     vitamin B-12 (CYANOCOBALAMIN) 1000 MCG tablet Take 1,000 mcg by mouth daily.     vitamin C (ASCORBIC ACID) 500 MG tablet Take 500 mg by mouth daily.     albuterol  (VENTOLIN  HFA) 108 (90 Base) MCG/ACT inhaler Inhale 2 puffs into the lungs every 6 (six) hours as needed for wheezing or shortness of breath. 8 g 2   amLODipine  (NORVASC ) 5 MG tablet Take 1 tablet (5 mg total) by mouth daily. 90 tablet 3   beclomethasone (QVAR  REDIHALER) 40 MCG/ACT inhaler Inhale 1-2 puffs into the lungs 2 (two) times daily. 1 each 5   No facility-administered medications prior to visit.    Allergies  Allergen Reactions   Brimonidine Tartrate-Timolol Other (See Comments)    Pt. reports eye redness & itchiness.   Chlorthalidone  Other (See Comments)    Significant hypokalemia   Losartan      Shortness of breath    Ace Inhibitors Cough    Review of Systems  Constitutional:  Negative for chills, fever and malaise/fatigue.  HENT:  Negative for congestion and hearing loss.   Eyes:  Negative for discharge.  Respiratory:  Negative for cough, sputum production and shortness of  breath.   Cardiovascular:  Negative for chest pain, palpitations and leg swelling.  Gastrointestinal:  Negative for abdominal pain, blood in stool, constipation, diarrhea, heartburn, nausea and vomiting.  Genitourinary:  Negative for dysuria, frequency, hematuria and urgency.  Musculoskeletal:  Negative for back pain, falls and myalgias.  Skin:  Negative for rash.  Neurological:  Negative for dizziness, sensory change, loss of consciousness, weakness and headaches.  Endo/Heme/Allergies:  Negative for environmental allergies. Does not bruise/bleed easily.  Psychiatric/Behavioral:  Negative for depression and suicidal ideas. The patient is not nervous/anxious and does not have insomnia.        Objective:    Physical Exam Constitutional:      General: She is not in acute distress.    Appearance: Normal appearance. She is well-developed. She is not toxic-appearing.  HENT:     Head: Normocephalic and atraumatic.     Right Ear: External ear normal.     Left Ear: External ear normal.     Nose: Nose normal.  Eyes:     General:        Right eye: No discharge.        Left eye: No discharge.     Conjunctiva/sclera: Conjunctivae normal.  Neck:     Thyroid : No thyromegaly.  Cardiovascular:     Rate and Rhythm: Normal rate and regular rhythm.     Heart sounds: Normal heart sounds. No murmur heard. Pulmonary:     Effort: Pulmonary effort is normal. No respiratory distress.     Breath sounds: Normal breath sounds.  Abdominal:     General: Bowel sounds are normal.     Palpations: Abdomen is soft.     Tenderness: There is no abdominal tenderness. There is no guarding.  Musculoskeletal:        General: Normal range of motion.     Cervical back: Neck supple.  Lymphadenopathy:     Cervical: No cervical adenopathy.  Skin:    General: Skin is warm and dry.  Neurological:     Mental Status: She is alert and oriented to person, place, and time.  Psychiatric:        Mood and Affect: Mood  normal.        Behavior: Behavior normal.        Thought Content: Thought content normal.        Judgment: Judgment normal.     BP 122/78 (BP Location: Right Arm, Patient Position: Sitting, Cuff Size: Normal)   Pulse (!) 57   Temp 98.2 F (36.8 C) (Oral)   Resp 12   Ht 5' 5 (1.651 m)   Wt 171 lb 12.8 oz (77.9 kg)   SpO2 98%   BMI 28.59 kg/m  Wt Readings from Last 3 Encounters:  01/27/24 171 lb 12.8 oz (77.9 kg)  01/25/23 178 lb (80.7 kg)  01/07/21 175 lb 3.2 oz (79.5 kg)    Diabetic Foot Exam - Simple   No data filed    Lab Results  Component Value Date   WBC 5.8 01/20/2024   HGB 13.9 01/20/2024   HCT 41.8 01/20/2024   PLT 174.0 01/20/2024   GLUCOSE 94 01/20/2024   CHOL 209 (H) 01/20/2024   TRIG 120.0 01/20/2024   HDL 52.90 01/20/2024   LDLDIRECT 131.6 12/12/2012   LDLCALC 132 (H) 01/20/2024   ALT 15 01/20/2024   AST 16 01/20/2024   NA 142 01/20/2024   K 4.7 01/20/2024   CL 105 01/20/2024   CREATININE 0.76 01/20/2024   BUN 18 01/20/2024   CO2 31 01/20/2024   TSH 2.38 01/20/2024   HGBA1C 5.4 01/09/2021    Lab Results  Component Value Date   TSH 2.38 01/20/2024   Lab Results  Component Value Date   WBC 5.8 01/20/2024   HGB 13.9 01/20/2024   HCT 41.8 01/20/2024  MCV 90.4 01/20/2024   PLT 174.0 01/20/2024   Lab Results  Component Value Date   NA 142 01/20/2024   K 4.7 01/20/2024   CO2 31 01/20/2024   GLUCOSE 94 01/20/2024   BUN 18 01/20/2024   CREATININE 0.76 01/20/2024   BILITOT 0.8 01/20/2024   ALKPHOS 68 01/20/2024   AST 16 01/20/2024   ALT 15 01/20/2024   PROT 6.2 01/20/2024   ALBUMIN 4.2 01/20/2024   CALCIUM 9.1 01/20/2024   GFR 72.55 01/20/2024   Lab Results  Component Value Date   CHOL 209 (H) 01/20/2024   Lab Results  Component Value Date   HDL 52.90 01/20/2024   Lab Results  Component Value Date   LDLCALC 132 (H) 01/20/2024   Lab Results  Component Value Date   TRIG 120.0 01/20/2024   Lab Results  Component Value  Date   CHOLHDL 4 01/20/2024   Lab Results  Component Value Date   HGBA1C 5.4 01/09/2021       Assessment & Plan:  Hypertension, unspecified type Assessment & Plan: Monitor and report any concerns, no changes to meds. Encouraged heart healthy diet such as the DASH diet and exercise as tolerated.   Orders: -     CBC with Differential/Platelet; Future -     Comprehensive metabolic panel with GFR; Future -     TSH; Future  Mixed hyperlipidemia Assessment & Plan: Encourage heart healthy diet such as MIND or DASH diet, increase exercise, avoid trans fats, simple carbohydrates and processed foods, consider a krill or fish or flaxseed oil cap daily.   Orders: -     Lipid panel; Future  Osteoporosis, unspecified osteoporosis type, unspecified pathological fracture presence Assessment & Plan: Encouraged to get adequate exercise, calcium and vitamin d  intake. Consider repeat Dexa scan  Orders: -     VITAMIN D  25 Hydroxy (Vit-D Deficiency, Fractures); Future  Vitamin D  deficiency Assessment & Plan: Supplement and monitor, she was taking Vitamin D  10000 IU daily and on check her number was elevated. She has been asked to stop all vitamin D  for a month and we will recheck levels   Uncomplicated asthma, unspecified asthma severity, unspecified whether persistent Assessment & Plan: No recent exacerbation   History of eye cancer  H/O Malignant melanoma  Preventative health care Assessment & Plan: RSV (respiratory syncitial virus) vaccine at pharmacy, Arexvy Covid booster new version at pharmacy High dose flu shot  Tetanus due, at pharmacy Shingrix  is the new shingles shot, 2 shots over 2-6 months, confirm coverage with insurance and document, then can return here for shots with nurse appt or at pharmacy Declines colonoscopy, pap and MGM and has aged out   Other orders -     Albuterol  Sulfate HFA; Inhale 2 puffs into the lungs every 6 (six) hours as needed for wheezing or  shortness of breath.  Dispense: 18 g; Refill: 5 -     Qvar  RediHaler; Inhale 1-2 puffs into the lungs 2 (two) times daily.  Dispense: 1 each; Refill: 5 -     amLODIPine  Besylate; Take 1 tablet (5 mg total) by mouth daily.  Dispense: 90 tablet; Refill: 3    Assessment and Plan Assessment & Plan Asthma Asthma is well-managed with current inhaler regimen. No recent exacerbations reported. She uses inhalers as needed during flare-ups. - Prescribe albuterol  inhaler with a 90-day supply and refills. - Prescribe beclomethasone inhaler with a 90-day supply and refills.  Hypertension Blood pressure is well-controlled with current medication regimen. No  recent ER visits or complications. She remains active and reports feeling well. - Prescribe amlodipine  with a 90-day supply and refills.  Osteoporosis Osteoporosis management discussed, including concerns about medication side effects such as osteonecrosis. Emphasized the importance of treatment in preventing fractures, noting that osteonecrosis is rare compared to fracture risk in untreated osteoporosis. - Consider bone density scan if she decides to reassess bone strength. - Discuss potential osteoporosis treatment options if she is interested.  General Health Maintenance Discussed the importance of vaccinations, including pneumonia, COVID, RSV, and shingles vaccines. Provided information on benefits and potential outcomes, emphasizing her role in preventing serious infections and complications. Explained that Prevnar 20 offers broader protection against pneumococcal strains. Highlighted that the updated shingles vaccine significantly reduces the risk of shingles and may lower future dementia risk. - Consider annual COVID and flu vaccines. - Consider Prevnar 20 for pneumococcal protection. - Consider RSV vaccine for respiratory protection. - Consider updated shingles vaccine for long-term protection and reduced dementia risk.  Follow-up She  prefers to continue care with current pharmacy and plans to return for annual follow-up and blood work. Discussed potential issues with [facility] and alternative options with [facility]ies, including mail order service. - [facility] [address] in [place]. - Schedule follow-up visit in one year for blood work and routine follow-up.  Recording duration: 24 minutes     Harlene Horton, MD

## 2024-02-02 DIAGNOSIS — L821 Other seborrheic keratosis: Secondary | ICD-10-CM | POA: Diagnosis not present

## 2024-02-02 DIAGNOSIS — D1801 Hemangioma of skin and subcutaneous tissue: Secondary | ICD-10-CM | POA: Diagnosis not present

## 2024-02-02 DIAGNOSIS — Z86006 Personal history of melanoma in-situ: Secondary | ICD-10-CM | POA: Diagnosis not present

## 2024-02-02 DIAGNOSIS — L814 Other melanin hyperpigmentation: Secondary | ICD-10-CM | POA: Diagnosis not present

## 2024-02-02 DIAGNOSIS — Z08 Encounter for follow-up examination after completed treatment for malignant neoplasm: Secondary | ICD-10-CM | POA: Diagnosis not present

## 2024-02-02 DIAGNOSIS — L570 Actinic keratosis: Secondary | ICD-10-CM | POA: Diagnosis not present

## 2024-02-02 DIAGNOSIS — L720 Epidermal cyst: Secondary | ICD-10-CM | POA: Diagnosis not present

## 2024-04-25 ENCOUNTER — Encounter: Payer: Self-pay | Admitting: Family Medicine

## 2025-01-22 ENCOUNTER — Other Ambulatory Visit

## 2025-01-29 ENCOUNTER — Ambulatory Visit: Admitting: Family Medicine

## 2025-01-31 ENCOUNTER — Ambulatory Visit: Admitting: Student
